# Patient Record
Sex: Female | Born: 1940
Health system: Southern US, Community
[De-identification: ages and names within clinical notes are randomized; demographics above are authoritative.]

## PROBLEM LIST (undated history)

## (undated) DIAGNOSIS — Z923 Personal history of irradiation: Secondary | ICD-10-CM

## (undated) DIAGNOSIS — G20 Parkinson's disease: Secondary | ICD-10-CM

## (undated) DIAGNOSIS — I10 Essential (primary) hypertension: Secondary | ICD-10-CM

## (undated) DIAGNOSIS — G20A1 Parkinson's disease without dyskinesia, without mention of fluctuations: Secondary | ICD-10-CM

## (undated) DIAGNOSIS — R3915 Urgency of urination: Secondary | ICD-10-CM

## (undated) DIAGNOSIS — C189 Malignant neoplasm of colon, unspecified: Secondary | ICD-10-CM

## (undated) DIAGNOSIS — C801 Malignant (primary) neoplasm, unspecified: Secondary | ICD-10-CM

## (undated) HISTORY — PX: BREAST SURGERY: SHX581

## (undated) HISTORY — PX: ABDOMINAL HYSTERECTOMY: SHX81

## (undated) HISTORY — PX: BREAST EXCISIONAL BIOPSY: SUR124

## (undated) HISTORY — PX: OTHER SURGICAL HISTORY: SHX169

## (undated) SURGERY — Surgical Case
Anesthesia: *Unknown

---

## 1998-12-19 ENCOUNTER — Encounter: Payer: Self-pay | Admitting: Family Medicine

## 1998-12-19 ENCOUNTER — Encounter: Admission: RE | Admit: 1998-12-19 | Discharge: 1998-12-19 | Payer: Self-pay | Admitting: Family Medicine

## 1998-12-30 ENCOUNTER — Encounter (INDEPENDENT_AMBULATORY_CARE_PROVIDER_SITE_OTHER): Payer: Self-pay | Admitting: Specialist

## 1998-12-30 ENCOUNTER — Ambulatory Visit (HOSPITAL_BASED_OUTPATIENT_CLINIC_OR_DEPARTMENT_OTHER): Admission: RE | Admit: 1998-12-30 | Discharge: 1998-12-30 | Payer: Self-pay | Admitting: Surgery

## 1999-08-27 ENCOUNTER — Emergency Department (HOSPITAL_COMMUNITY): Admission: EM | Admit: 1999-08-27 | Discharge: 1999-08-27 | Payer: Self-pay | Admitting: *Deleted

## 1999-08-31 ENCOUNTER — Other Ambulatory Visit: Admission: RE | Admit: 1999-08-31 | Discharge: 1999-08-31 | Payer: Self-pay | Admitting: Family Medicine

## 1999-12-21 ENCOUNTER — Encounter: Admission: RE | Admit: 1999-12-21 | Discharge: 1999-12-21 | Payer: Self-pay | Admitting: Family Medicine

## 1999-12-21 ENCOUNTER — Encounter: Payer: Self-pay | Admitting: Family Medicine

## 2000-05-13 ENCOUNTER — Encounter: Payer: Self-pay | Admitting: Family Medicine

## 2000-05-13 ENCOUNTER — Encounter: Admission: RE | Admit: 2000-05-13 | Discharge: 2000-05-13 | Payer: Self-pay | Admitting: Family Medicine

## 2001-10-18 ENCOUNTER — Encounter: Payer: Self-pay | Admitting: Family Medicine

## 2001-10-18 ENCOUNTER — Encounter: Admission: RE | Admit: 2001-10-18 | Discharge: 2001-10-18 | Payer: Self-pay | Admitting: Family Medicine

## 2001-10-24 ENCOUNTER — Encounter: Payer: Self-pay | Admitting: Family Medicine

## 2001-10-24 ENCOUNTER — Encounter: Admission: RE | Admit: 2001-10-24 | Discharge: 2001-10-24 | Payer: Self-pay | Admitting: Family Medicine

## 2002-12-10 ENCOUNTER — Encounter: Admission: RE | Admit: 2002-12-10 | Discharge: 2002-12-10 | Payer: Self-pay | Admitting: Family Medicine

## 2002-12-26 ENCOUNTER — Ambulatory Visit (HOSPITAL_COMMUNITY): Admission: RE | Admit: 2002-12-26 | Discharge: 2002-12-26 | Payer: Self-pay | Admitting: General Surgery

## 2003-02-27 ENCOUNTER — Encounter (INDEPENDENT_AMBULATORY_CARE_PROVIDER_SITE_OTHER): Payer: Self-pay | Admitting: Specialist

## 2003-02-27 ENCOUNTER — Inpatient Hospital Stay (HOSPITAL_COMMUNITY): Admission: RE | Admit: 2003-02-27 | Discharge: 2003-03-04 | Payer: Self-pay | Admitting: General Surgery

## 2003-12-19 ENCOUNTER — Encounter: Admission: RE | Admit: 2003-12-19 | Discharge: 2003-12-19 | Payer: Self-pay | Admitting: Family Medicine

## 2004-05-26 ENCOUNTER — Encounter: Admission: RE | Admit: 2004-05-26 | Discharge: 2004-05-26 | Payer: Self-pay | Admitting: Family Medicine

## 2005-01-11 ENCOUNTER — Encounter: Admission: RE | Admit: 2005-01-11 | Discharge: 2005-01-11 | Payer: Self-pay | Admitting: Family Medicine

## 2005-11-26 ENCOUNTER — Other Ambulatory Visit: Admission: RE | Admit: 2005-11-26 | Discharge: 2005-11-26 | Payer: Self-pay | Admitting: Family Medicine

## 2006-01-01 HISTORY — PX: BREAST BIOPSY: SHX20

## 2006-01-12 ENCOUNTER — Encounter: Admission: RE | Admit: 2006-01-12 | Discharge: 2006-01-12 | Payer: Self-pay | Admitting: Family Medicine

## 2006-01-20 ENCOUNTER — Encounter: Admission: RE | Admit: 2006-01-20 | Discharge: 2006-01-20 | Payer: Self-pay | Admitting: Family Medicine

## 2006-01-24 ENCOUNTER — Encounter (INDEPENDENT_AMBULATORY_CARE_PROVIDER_SITE_OTHER): Payer: Self-pay | Admitting: Specialist

## 2006-01-24 ENCOUNTER — Encounter: Admission: RE | Admit: 2006-01-24 | Discharge: 2006-01-24 | Payer: Self-pay | Admitting: Family Medicine

## 2006-01-24 ENCOUNTER — Encounter (INDEPENDENT_AMBULATORY_CARE_PROVIDER_SITE_OTHER): Payer: Self-pay | Admitting: Diagnostic Radiology

## 2006-01-31 ENCOUNTER — Encounter: Admission: RE | Admit: 2006-01-31 | Discharge: 2006-01-31 | Payer: Self-pay | Admitting: Family Medicine

## 2006-02-01 DIAGNOSIS — Z923 Personal history of irradiation: Secondary | ICD-10-CM

## 2006-02-01 HISTORY — DX: Personal history of irradiation: Z92.3

## 2006-02-01 HISTORY — PX: BREAST LUMPECTOMY: SHX2

## 2006-02-17 ENCOUNTER — Encounter: Admission: RE | Admit: 2006-02-17 | Discharge: 2006-02-17 | Payer: Self-pay | Admitting: General Surgery

## 2006-02-22 ENCOUNTER — Ambulatory Visit (HOSPITAL_BASED_OUTPATIENT_CLINIC_OR_DEPARTMENT_OTHER): Admission: RE | Admit: 2006-02-22 | Discharge: 2006-02-22 | Payer: Self-pay | Admitting: General Surgery

## 2006-02-22 ENCOUNTER — Encounter: Admission: RE | Admit: 2006-02-22 | Discharge: 2006-02-22 | Payer: Self-pay | Admitting: General Surgery

## 2006-02-22 ENCOUNTER — Encounter (INDEPENDENT_AMBULATORY_CARE_PROVIDER_SITE_OTHER): Payer: Self-pay | Admitting: Specialist

## 2006-03-01 ENCOUNTER — Ambulatory Visit: Payer: Self-pay | Admitting: Oncology

## 2006-03-14 ENCOUNTER — Ambulatory Visit: Admission: RE | Admit: 2006-03-14 | Discharge: 2006-05-25 | Payer: Self-pay | Admitting: Radiation Oncology

## 2006-03-24 ENCOUNTER — Encounter: Admission: RE | Admit: 2006-03-24 | Discharge: 2006-03-24 | Payer: Self-pay | Admitting: Radiation Oncology

## 2006-04-04 LAB — CBC WITH DIFFERENTIAL/PLATELET
BASO%: 0.9 % (ref 0.0–2.0)
Basophils Absolute: 0 10*3/uL (ref 0.0–0.1)
Eosinophils Absolute: 0.1 10*3/uL (ref 0.0–0.5)
HCT: 37.1 % (ref 34.8–46.6)
LYMPH%: 25 % (ref 14.0–48.0)
MONO#: 0.3 10*3/uL (ref 0.1–0.9)
MONO%: 6 % (ref 0.0–13.0)
NEUT#: 3.2 10*3/uL (ref 1.5–6.5)
Platelets: 232 10*3/uL (ref 145–400)
lymph#: 1.2 10*3/uL (ref 0.9–3.3)

## 2006-04-04 LAB — COMPREHENSIVE METABOLIC PANEL
AST: 17 U/L (ref 0–37)
Albumin: 4.5 g/dL (ref 3.5–5.2)
BUN: 20 mg/dL (ref 6–23)
CO2: 30 mEq/L (ref 19–32)
Chloride: 103 mEq/L (ref 96–112)
Creatinine, Ser: 0.82 mg/dL (ref 0.40–1.20)
Sodium: 143 mEq/L (ref 135–145)
Total Bilirubin: 0.5 mg/dL (ref 0.3–1.2)
Total Protein: 6.7 g/dL (ref 6.0–8.3)

## 2006-04-04 LAB — CANCER ANTIGEN 27.29: CA 27.29: 15 U/mL (ref 0–39)

## 2006-05-04 ENCOUNTER — Ambulatory Visit: Payer: Self-pay | Admitting: Oncology

## 2006-05-09 LAB — COMPREHENSIVE METABOLIC PANEL
AST: 20 U/L (ref 0–37)
CO2: 29 mEq/L (ref 19–32)
Chloride: 106 mEq/L (ref 96–112)
Creatinine, Ser: 0.81 mg/dL (ref 0.40–1.20)
Total Bilirubin: 0.4 mg/dL (ref 0.3–1.2)
Total Protein: 6.8 g/dL (ref 6.0–8.3)

## 2006-05-09 LAB — CBC WITH DIFFERENTIAL/PLATELET
HCT: 37.7 % (ref 34.8–46.6)
Platelets: 208 10*3/uL (ref 145–400)
RDW: 15.5 % — ABNORMAL HIGH (ref 11.3–14.5)

## 2006-09-08 ENCOUNTER — Ambulatory Visit: Payer: Self-pay | Admitting: Oncology

## 2006-09-12 LAB — COMPREHENSIVE METABOLIC PANEL
ALT: 15 U/L (ref 0–35)
Albumin: 4.1 g/dL (ref 3.5–5.2)
Alkaline Phosphatase: 57 U/L (ref 39–117)
BUN: 22 mg/dL (ref 6–23)
CO2: 26 mEq/L (ref 19–32)
Chloride: 106 mEq/L (ref 96–112)
Potassium: 3.7 mEq/L (ref 3.5–5.3)
Total Protein: 6.3 g/dL (ref 6.0–8.3)

## 2006-09-12 LAB — CBC WITH DIFFERENTIAL/PLATELET
BASO%: 0.6 % (ref 0.0–2.0)
Basophils Absolute: 0 10*3/uL (ref 0.0–0.1)
Eosinophils Absolute: 0.1 10*3/uL (ref 0.0–0.5)
HGB: 12.6 g/dL (ref 11.6–15.9)
LYMPH%: 19.7 % (ref 14.0–48.0)
MCHC: 34.8 g/dL (ref 32.0–36.0)
MONO#: 0.3 10*3/uL (ref 0.1–0.9)
NEUT#: 3 10*3/uL (ref 1.5–6.5)
RDW: 14.7 % — ABNORMAL HIGH (ref 11.3–14.5)

## 2006-12-12 ENCOUNTER — Ambulatory Visit: Payer: Self-pay | Admitting: Oncology

## 2006-12-14 LAB — CBC WITH DIFFERENTIAL/PLATELET
BASO%: 1.2 % (ref 0.0–2.0)
Basophils Absolute: 0.1 10*3/uL (ref 0.0–0.1)
HCT: 36.9 % (ref 34.8–46.6)
MCV: 86.6 fL (ref 81.0–101.0)
MONO%: 6.7 % (ref 0.0–13.0)
RBC: 4.27 10*6/uL (ref 3.70–5.32)
RDW: 14.5 % (ref 11.3–14.5)
lymph#: 1 10*3/uL (ref 0.9–3.3)

## 2006-12-14 LAB — COMPREHENSIVE METABOLIC PANEL
ALT: 23 U/L (ref 0–35)
AST: 30 U/L (ref 0–37)
Albumin: 4.3 g/dL (ref 3.5–5.2)
Alkaline Phosphatase: 70 U/L (ref 39–117)
BUN: 15 mg/dL (ref 6–23)
Chloride: 105 mEq/L (ref 96–112)
Creatinine, Ser: 0.91 mg/dL (ref 0.40–1.20)
Total Bilirubin: 0.5 mg/dL (ref 0.3–1.2)
Total Protein: 6.7 g/dL (ref 6.0–8.3)

## 2007-01-16 ENCOUNTER — Encounter: Admission: RE | Admit: 2007-01-16 | Discharge: 2007-01-16 | Payer: Self-pay | Admitting: Oncology

## 2007-06-12 ENCOUNTER — Ambulatory Visit: Payer: Self-pay | Admitting: Oncology

## 2007-06-19 IMAGING — CR DG CHEST 2V
2 series · 2 of 2 positions shown · non-contrast
Comparison: Report dated 02/15/2003.

CLINICAL DATA: Left breast carcinoma. Preoperative evaluation. Hypertension.

CHEST - 2 VIEW

[w chest pa]
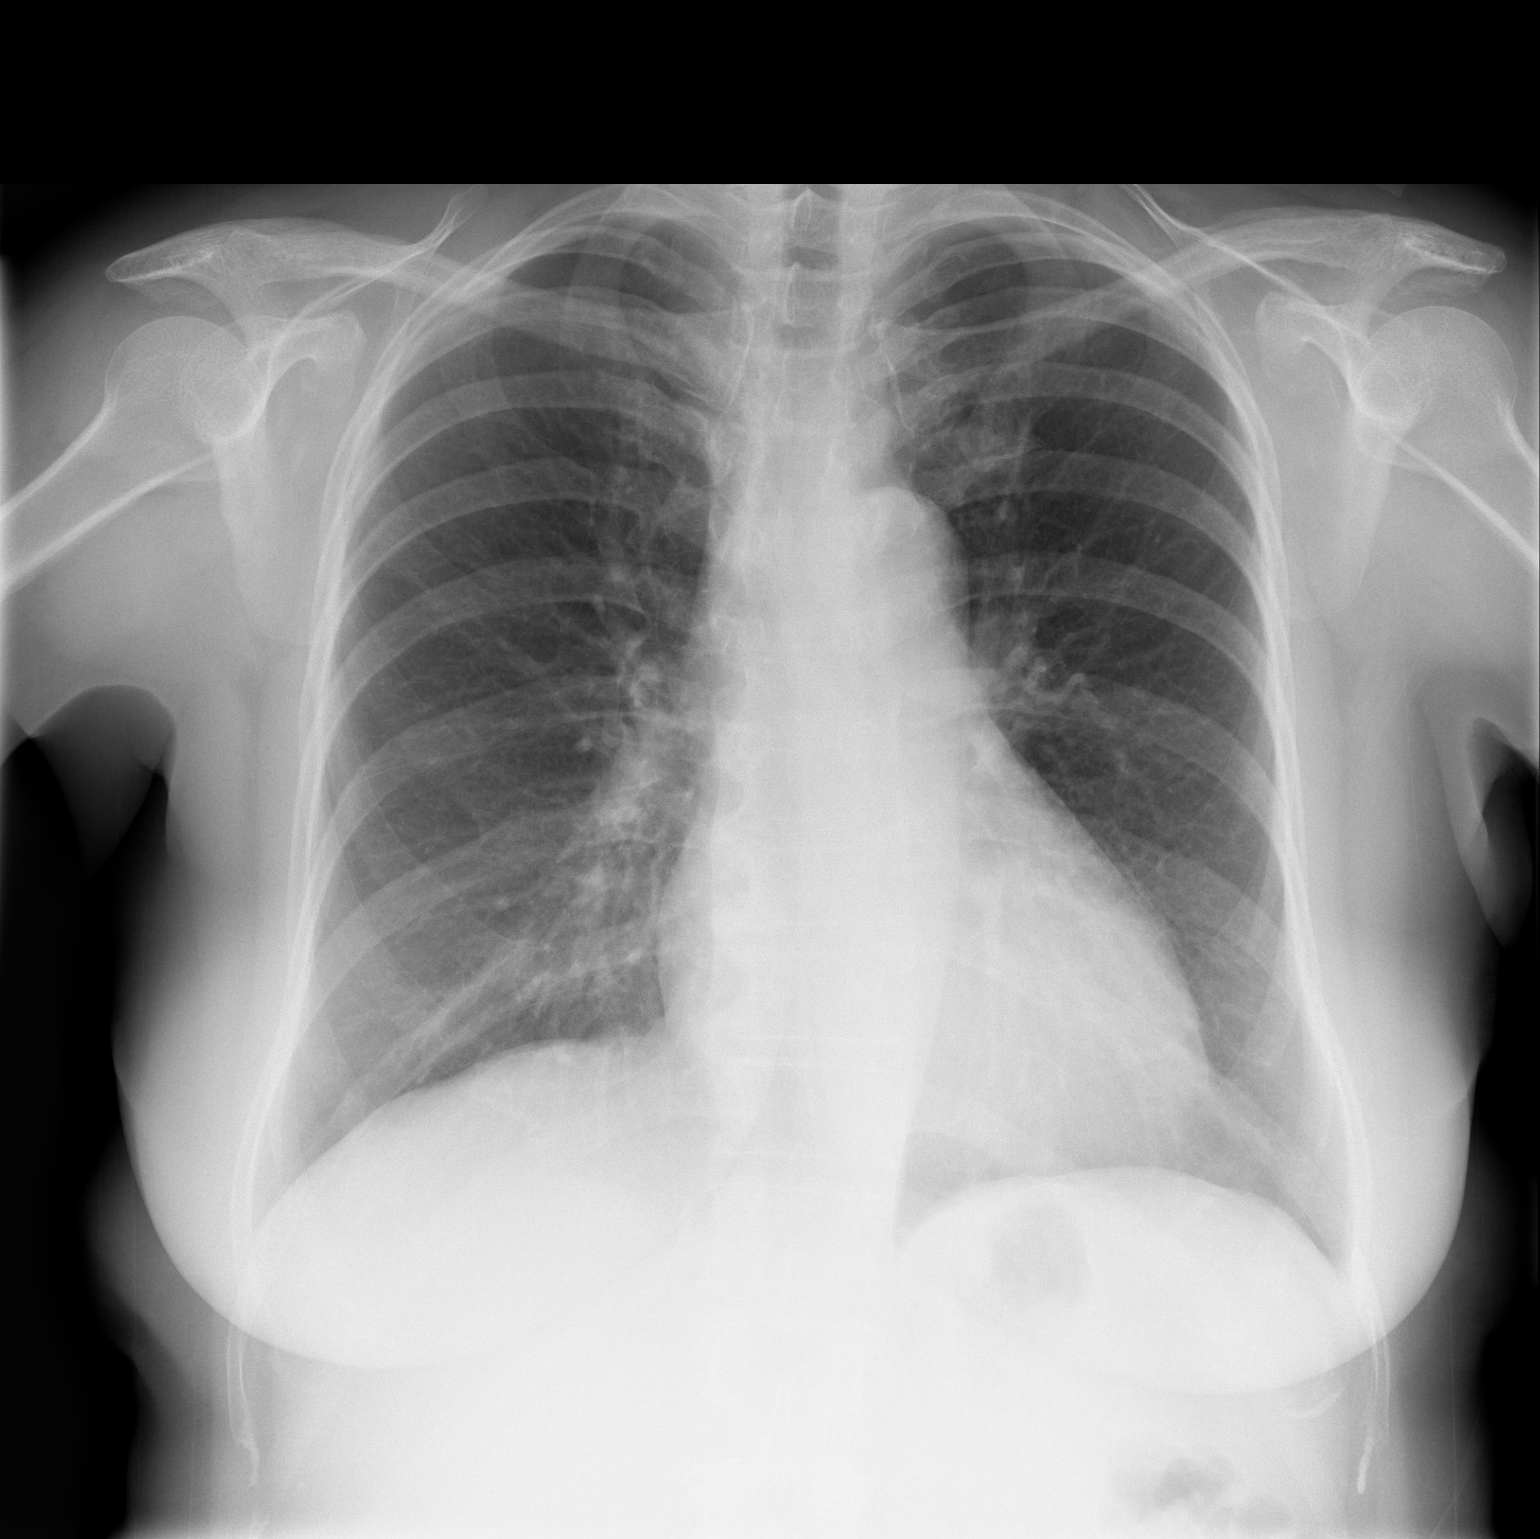

[w chest lat]
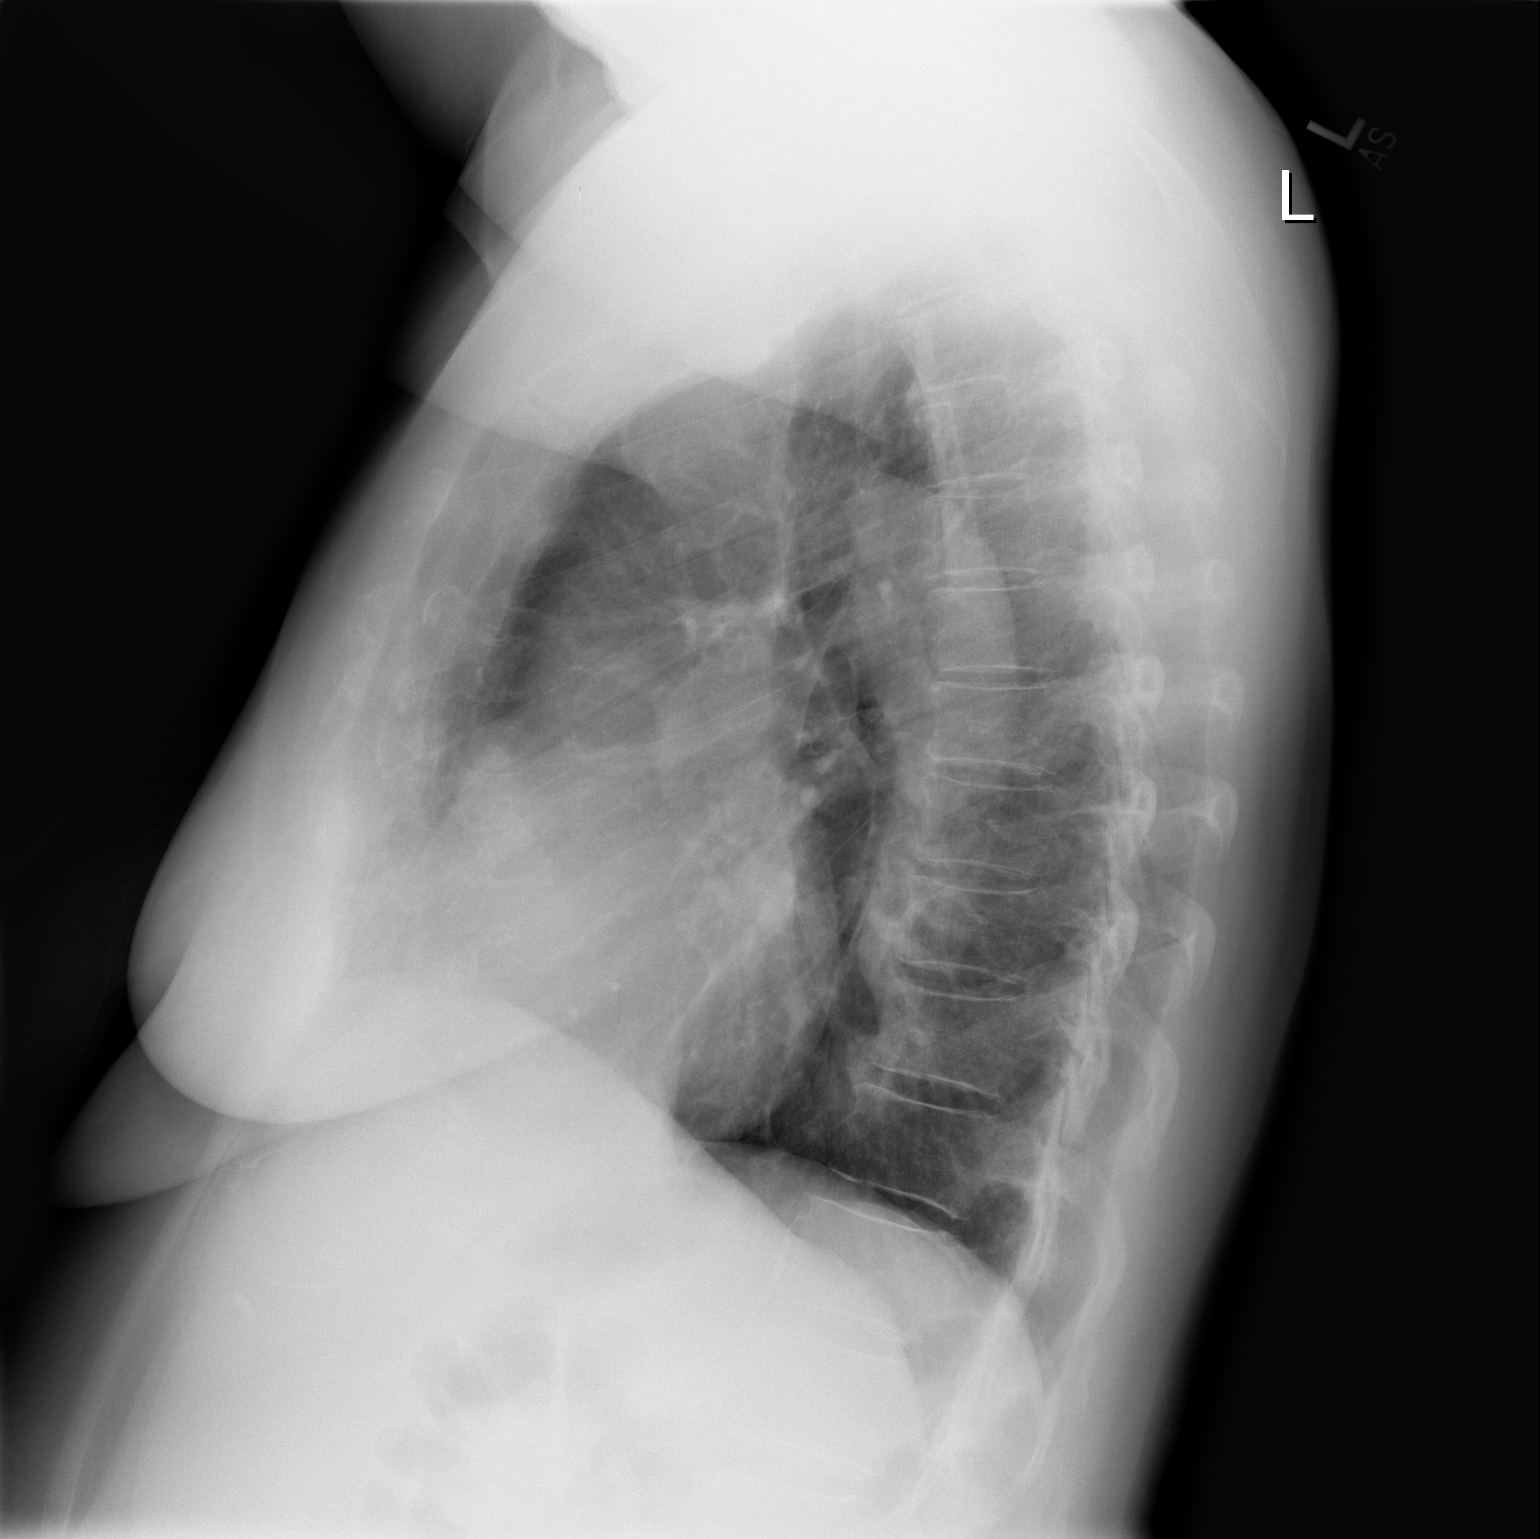

[2 of 2 positions shown; findings below may reference images not displayed]

FINDINGS: Borderline enlarged cardiac silhouette. Mildly tortuous aorta. Clear
lungs. Lower thoracic spine degenerative changes and minimal scoliosis.

IMPRESSION

Borderline cardiomegaly. No acute abnormality.

## 2007-11-22 ENCOUNTER — Other Ambulatory Visit: Admission: RE | Admit: 2007-11-22 | Discharge: 2007-11-22 | Payer: Self-pay | Admitting: Obstetrics and Gynecology

## 2007-12-06 ENCOUNTER — Encounter: Admission: RE | Admit: 2007-12-06 | Discharge: 2007-12-06 | Payer: Self-pay | Admitting: Obstetrics and Gynecology

## 2007-12-14 ENCOUNTER — Ambulatory Visit (HOSPITAL_COMMUNITY): Admission: RE | Admit: 2007-12-14 | Discharge: 2007-12-14 | Payer: Self-pay | Admitting: Obstetrics and Gynecology

## 2008-01-17 ENCOUNTER — Encounter: Admission: RE | Admit: 2008-01-17 | Discharge: 2008-01-17 | Payer: Self-pay | Admitting: Oncology

## 2008-07-10 ENCOUNTER — Ambulatory Visit: Payer: Self-pay | Admitting: Oncology

## 2009-01-17 ENCOUNTER — Encounter: Admission: RE | Admit: 2009-01-17 | Discharge: 2009-01-17 | Payer: Self-pay | Admitting: Oncology

## 2009-05-18 ENCOUNTER — Emergency Department (HOSPITAL_COMMUNITY): Admission: EM | Admit: 2009-05-18 | Discharge: 2009-05-18 | Payer: Self-pay | Admitting: Emergency Medicine

## 2009-07-09 ENCOUNTER — Ambulatory Visit: Payer: Self-pay | Admitting: Oncology

## 2009-08-20 ENCOUNTER — Ambulatory Visit: Payer: Self-pay | Admitting: Oncology

## 2010-01-08 ENCOUNTER — Encounter
Admission: RE | Admit: 2010-01-08 | Discharge: 2010-01-08 | Payer: Self-pay | Source: Home / Self Care | Admitting: Family Medicine

## 2010-01-19 ENCOUNTER — Encounter
Admission: RE | Admit: 2010-01-19 | Discharge: 2010-01-19 | Payer: Self-pay | Source: Home / Self Care | Attending: Oncology | Admitting: Oncology

## 2010-02-21 ENCOUNTER — Encounter: Payer: Self-pay | Admitting: General Surgery

## 2010-02-22 ENCOUNTER — Encounter: Payer: Self-pay | Admitting: Family Medicine

## 2010-04-21 LAB — BASIC METABOLIC PANEL
Calcium: 8.9 mg/dL (ref 8.4–10.5)
Creatinine, Ser: 0.75 mg/dL (ref 0.4–1.2)
GFR calc Af Amer: >60 mL/min (ref >60)
Glucose, Bld: 94 mg/dL (ref 70–99)
Potassium: 3 mEq/L — ABNORMAL LOW (ref 3.5–5.1)

## 2010-04-21 LAB — CBC
MCHC: 33 g/dL (ref 30.0–36.0)
Platelets: 197 10*3/uL (ref 150–400)

## 2010-04-21 LAB — URINE CULTURE
Colony Count: NO GROWTH
Culture: NO GROWTH

## 2010-04-21 LAB — DIFFERENTIAL
Basophils Absolute: 0.1 10*3/uL (ref 0.0–0.1)
Monocytes Relative: 7 % (ref 3–12)
Neutrophils Relative %: 68 % (ref 43–77)

## 2010-04-21 LAB — URINALYSIS, ROUTINE W REFLEX MICROSCOPIC
Nitrite: NEGATIVE
Protein, ur: 30 mg/dL — AB

## 2010-04-21 LAB — URINE MICROSCOPIC-ADD ON

## 2010-06-19 NOTE — Op Note (Signed)
NAMEMARINE, LEZOTTE                ACCOUNT NO.:  [id number]   MEDICAL RECORD NO.:  [id number]          PATIENT TYPE:  AMB   LOCATION:  DSC                          FACILITY:  [facility]   PHYSICIAN:  Angelia Mould. Derrell Lolling, M.D.DATE OF BIRTH:  25-Nov-1940   DATE OF PROCEDURE:  02/22/2006  DATE OF DISCHARGE:                               OPERATIVE REPORT   PREOPERATIVE DIAGNOSIS:  Ductal carcinoma in situ left breast.   POSTOPERATIVE DIAGNOSIS:  Ductal carcinoma in situ left breast.   OPERATION PERFORMED:  Left partial mastectomy with needle localization  and specimen mammogram.   SURGEON:  Angelia Mould. Derrell Lolling, M.D.   OPERATIVE INDICATIONS:  This is a 70 year old white female who has a  history of villous adenoma of her right colon and underwent a right  colectomy in 2005.  On a recent mammogram, there was a focal area of  microcalcifications in the 12 o'clock position of the left breast.  Stereotactic core biopsy showed ductal carcinoma in situ which was ER  and PR positive.  She had an MRI last month which showed a solitary  enhancement at the 12 o'clock position of the left breast consistent  with a biopsy site, and otherwise, there was no abnormality in either  breast and there was no adenopathy.  Options were discussed and she was  counseled as an outpatient.  She was interested in breast conservation.  She is brought to the operating room for partial mastectomy.   OPERATIVE TECHNIQUE:  The patient underwent wire localization of the  area of microcalcifications in her left breast this morning.  That wire  localization was satisfactory.  The patient was brought to the operating  room, placed supine on the operative table.  General endotracheal  anesthesia was induced.  The left breast was prepped and draped in a  sterile fashion.  The wire localization films were reviewed.  The wire  entered superiorly, but was angled posteriorly and inferiorly.  A curved  transverse incision was made in  the superior aspect of the left breast  about 2 cm inferior to the insertion site of the wire.  Dissection was  carried down into the breast tissue and around the wire in all  directions.  Dissection was mostly with electrocautery.  The specimen  was marked with silk sutures to orient the pathologist.  Specimen  mammogram showed that the microcalcifications were within the specimen,  and imprint cytology of the margins by Dr. Laureen Ochs revealed no evidence of  any cancer cells.  The wound was irrigated with saline.  Hemostasis was  excellent.  The breast tissue was closed with interrupted sutures of 3-0  Vicryl and the skin closed with a running subcuticular suture of 4-0  Monocryl and Steri-Strips.  Clean bandages were placed.  The patient was  taken to the recovery room in stable condition.   ESTIMATED BLOOD LOSS:  About 10 mL.   COMPLICATIONS:  None.   SPONGE AND INSTRUMENT COUNTS:  Correct.      Angelia Mould. Derrell Lolling, M.D.  Electronically Signed     HMI/MEDQ  D:  02/22/2006  T:  02/22/2006  Job:  [id number]   cc:   Chales Salmon. Abigail Miyamoto, M.D.  James L. Malon Kindle., M.D.

## 2010-06-19 NOTE — Discharge Summary (Signed)
NAME:  Katie Carrillo, Katie Carrillo                          ACCOUNT NO.:  [id number]   MEDICAL RECORD NO.:  [id number]                   PATIENT TYPE:  INP   LOCATION:  0359                                 FACILITY:  [facility]   PHYSICIAN:  Angelia Mould. Derrell Lolling, M.D.             DATE OF BIRTH:  01/10/1941   DATE OF ADMISSION:  02/27/2003  DATE OF DISCHARGE:  03/04/2003                                 DISCHARGE SUMMARY   FINAL DIAGNOSES:  1. Villous adenoma of the right colon.  2. Borderline hypertension.   OPERATIONS PERFORMED:  Laparoscopy-assisted right colectomy.  Date:  February 27, 2003.   HISTORY:  This is a 70 year old white female whose mother had a history of  colon polyps.  She underwent screening colonoscopy which revealed an  ulcerated mass in the mid ascending colon.  Biopsy showed villous adenoma.  Dr. Randa Evens was concerned that this might be underlying cancer.  She was  counseled in the office regarding segmental colon resection.  She underwent  bowel prep at home and was brought to the hospital electively for colon  resection.   PHYSICAL EXAMINATION:  GENERAL:  Pleasant woman, somewhat overweight.  Weight 212 pounds.  NECK:  Revealed no adenopathy or mass.  LUNGS:  Clear to auscultation.  HEART:  Regular rate and rhythm, no murmur.  ABDOMEN:  Soft, nontender, somewhat obese.  Liver and spleen not enlarged.  Well-healed Pfannenstiel incision.  No hernias, no palpable mass.  GENITOURINARY:  No inguinal adenopathy.   HOSPITAL COURSE:  On the day of admission the patient was taken to the  operating room and underwent a laparoscopy-assisted right colectomy.  We  identified a 3 cm sessile polyp in the mid ascending colon and no other  abnormalities were found.   Final pathology report showed tubulovillous adenoma with no evidence of high-  grade dysplasia or carcinoma.  Eleven benign lymph nodes were evaluated.   Postoperatively, the patient did well.  We advanced her diet slowly  but  steadily without any problems.  She began having bowel movements as her  ileus resolved.  She was discharged on March 04, 2003.  On the date of  discharge her wound was healthy without any signs of infection.  She was  having bowel movements, tolerating  regular diet, she was comfortable and  afebrile.  She was asked to follow up with me in the office in 4-7 days.                                               Angelia Mould. Derrell Lolling, M.D.    HMI/MEDQ  D:  03/11/2003  T:  03/11/2003  Job:  [id number]   cc:   Chales Salmon. Abigail Miyamoto, M.D.  [address]  [place]  [place] [address]  Fax:  [phone]   Fayrene Fearing L. Malon Kindle., M.D.  [address]  [place]  [place] [address]  Fax: [phone]

## 2010-06-19 NOTE — Op Note (Signed)
[facility]  Patient:    Katie Carrillo                        MRN: [id number] Proc. Date: 12/30/98 Adm. Date:  [id number] Attending:  Katha Cabal CC:         Chales Salmon. Abigail Miyamoto, M.D.                           Operative Report  CCS# [id number]  PREOPERATIVE DIAGNOSIS:  Basal cell carcinoma of the right leg.  POSTOPERATIVE DIAGNOSIS:  Basal cell carcinoma of the right leg, status post excision and primary closure of basal cell carcinoma.  OPERATION PERFORMED:  SURGEON:  Matthew B. Daphine Deutscher, M.D.  ANESTHESIA:  MAC.  INDICATIONS FOR PROCEDURE:  [name] is a 70 year old lady who was seen in y office on November 17 with a history of a right leg basal cell carcinoma biopsied at Dr. Nils Flack office on December 08, 1998.  A pathology report was reviewed indicating this.  I obtained informed consent regarding excision with either primary closure or split thickness skin graft.  The area is on the right shin anteriorly.  The patient states that she had a basal cell carcinoma taken off that area about five years ago.  DESCRIPTION OF PROCEDURE:  [name] was taken to operating room 3 on the morning of November 28 and given intravenous sedation. The leg was prepped with Betadine and draped sterilely.  I described a longitudinally oriented ellipse to get margins around this area of slightly reddened, erythematous area.  I had gross margins which were negative.  I went ahead and injected this area with lidocaine and made a full thickness excision down to the fascia including the fat.  This was marked ith a suture on the distal margin and then I was able to undermine the flaps medially and laterally with sharp dissection and then I closed this primarily with interrupted 4-0 Vicryls then a running 4-0 nylon suture.  A sterile dressing was then applied.  The patient tolerated the procedure well.  She will be discharged on Tylox to take for  pain and will be followed up for suture removal in 10 days to two weeks.  FINAL DIAGNOSIS:  Basal cell carcinoma of the leg status post excision with primary closure. DD:  12/30/98 TD:  12/30/98 Job: [id number]

## 2010-06-19 NOTE — Op Note (Signed)
NAME:  Katie Carrillo, Katie Carrillo                          ACCOUNT NO.:  [id number]   MEDICAL RECORD NO.:  [id number]                   PATIENT TYPE:  INP   LOCATION:  X006                                 FACILITY:  [facility]   PHYSICIAN:  Angelia Mould. Derrell Lolling, M.D.             DATE OF BIRTH:  [birth year]   DATE OF PROCEDURE:  02/27/2003  DATE OF DISCHARGE:                                 OPERATIVE REPORT   PREOPERATIVE DIAGNOSIS:  Neoplastic mass of the right colon.   POSTOPERATIVE DIAGNOSIS:  Neoplastic mass of the right colon.   OPERATION PERFORMED:  Laparoscopic-assisted right colectomy.   SURGEON:  Angelia Mould. Derrell Lolling, M.D.   FIRST ASSISTANT:  Currie Paris, M.D.   OPERATIVE INDICATIONS:  This is a 70 year old white female, who had a  screening colonoscopy.  Dr. Carman Ching performed the colonoscopy and  stated that he saw approximately 5 cm diameter ulcerated mass in the mid  ascending colon.  He said this was in the mid ascending colon.  This was  biopsied and showed villous adenoma.  Dr. Randa Evens was concerned that this  might be a carcinoma.  She has had a CT scan which showed some thickening in  the cecum but nothing else.  She has undergone a bowel prep at home and is  brought to the operating room electively for a right colectomy.   OPERATIVE FINDINGS:  I found approximately 2.5-3.0 cm multilobulated polyp  in the mid ascending colon, clearly above the ileocecal valve.  This looked  more like a villous adenoma than a carcinoma.  I did not palpate or see any  other mass in the colon.  There was no other abnormality noted on general  laparoscopic survey of the abdomen or pelvis.  Given the fact that Dr.  Randa Evens only saw 1 polypoid mass in the colon, and I clearly identified this  mass, I felt that this was consistent with the colonoscopic findings.  I  chose to take the resection up beyond the hepatic flexure, nevertheless.   OPERATIVE TECHNIQUE:  Following the induction of general  endotracheal  anesthesia, a Foley catheter was inserted.  The abdomen was prepped and  draped in a sterile fashion.  A transverse incision was made at the superior  rim of the umbilicus.  The fascia was incised transversely.  The abdominal  cavity was entered under direct vision.  A 10 mm Hasson trocar was inserted  and secured with a pursestring suture of 0 Vicryl.  Pneumoperitoneum was  created.  Video cam was inserted with visualization and findings as  described above.  A 10 mm trocar was placed midway between the umbilicus and  the symphysis pubis and a 5 mm trocar placed in the right mid abdomen.  The  patient was examined in multiple positions, mostly in a left tilt, head up  or left tilt, head down position.  We mobilized the terminal ileum by  dividing a few adhesions and then dividing the lateral peritoneal  attachments.  We then mobilized the right colon by dividing the lateral  peritoneal attachments and then bluntly dissecting the right colon and the  terminal ileum up across the midline.  We divided the attachments at the  hepatic flexure and using the harmonic scalpel, took the dissection over  almost to the mid transverse colon.  We identified the duodenum and then  separated the duodenum from the right colon mesentery without any trouble.  Once we had the colon completely mobilized, we could pull the cecum all the  way over almost to the left upper quadrant.  At this point in time, we felt  we had enough mobilization.  The pneumoperitoneum was released.  We removed  the umbilical trocar, and an 8 cm transverse incision was made on the right  abdomen, starting at the umbilicus and extending to the right.  Muscles were  divided with electrocautery.  Hemostasis was good.  We placed a wound  protractor in this wound and then brought the cecum up through the wound.  We found that we could mobilize the terminal ileum, the cecum, the right  colon, and the hepatic flexure without  any difficulty at all.  We palpated  the 1 inch polypoid mass in the right colon above cecum and above the  ileocecal valve.  We very carefully palpated the colon proximally and found  no other masses.  We transected the terminal ileum with a GIA stapling  device and placed stay sutures on the remaining ileum.  We transected the  right transverse colon with the GIA stapling device.  We had good  mobilization of the mesentery and took the mesenteric dissection fairly far,  isolating the mesenteric vessels, clamping them, dividing them, and ligating  them with 2-0 silk ties.  The larger right colic and ileocolic vessels were  doubly ligated with 2-0 silk ties.   We removed the specimen and took it to a side table.  We opened the specimen  and identified the polyp as described above.  I reviewed the colonoscopy  report, confirming that only 1 polypoid mass had been found.  I felt that  this was consistent with the colonoscopic findings, and the specimen was  sent to pathology.   I changed my gloves.  An anastomosis was created between the terminal ileum  and the right transverse colon in a functional end-to-end fashion with a GIA  stapling device.  The defect in the bowel wall was closed with TA 60  stapling device.   We then changed our gloves and instruments and draped off the field again.  We closed the mesentery with interrupted sutures of 2-0 silk.  We checked  for bleeding and found none.  We irrigated the colon and then dropped it  back in the abdominal cavity.  The posterior rectus sheath was closed with a  running suture of #1 PDS; the wound was irrigated; the anterior rectus  sheath was closed with a running suture of #1 PDS.  The midline fascia was  closed with #1 PDS.  The wound was irrigated with saline and the skin closed  with skin staples.   We insufflated the abdomen one more time and placed the 30-degree scope back in and surveyed all 4 quadrants of the abdomen.  The  bowel looked healthy.  There was really no blood and no signs of any bleeding whatsoever in the  abdomen or at the trocar sites.  The pneumoperitoneum was released.  The  trocars were removed.  Skin incisions were closed with skin staples.  Clean  bandages were placed and the patient taken to recovery in stable condition.  The estimated blood loss was about 50-60 mL.  Complications were none.  Sponge, needle, and instrument counts were correct.                                               Angelia Mould. Derrell Lolling, M.D.    HMI/MEDQ  D:  02/27/2003  T:  02/27/2003  Job:  [id number]   cc:   Chales Salmon. Abigail Miyamoto, M.D.  [address]  [place]  [place] [address]  Fax: [phone]   Llana Aliment. Malon Kindle., M.D.  [address]  [place]  [place] [address]  Fax: [phone]

## 2010-09-30 ENCOUNTER — Other Ambulatory Visit: Payer: Self-pay | Admitting: Oncology

## 2010-09-30 ENCOUNTER — Encounter (HOSPITAL_BASED_OUTPATIENT_CLINIC_OR_DEPARTMENT_OTHER): Payer: Medicare Other | Admitting: Oncology

## 2010-09-30 DIAGNOSIS — C50419 Malignant neoplasm of upper-outer quadrant of unspecified female breast: Secondary | ICD-10-CM

## 2010-09-30 DIAGNOSIS — Z170 Estrogen receptor positive status [ER+]: Secondary | ICD-10-CM

## 2010-09-30 DIAGNOSIS — D0590 Unspecified type of carcinoma in situ of unspecified breast: Secondary | ICD-10-CM

## 2010-09-30 DIAGNOSIS — Z853 Personal history of malignant neoplasm of breast: Secondary | ICD-10-CM

## 2010-09-30 LAB — CBC WITH DIFFERENTIAL/PLATELET
BASO%: 0.7 % (ref 0.0–2.0)
EOS%: 2.8 % (ref 0.0–7.0)
HCT: 37.6 % (ref 34.8–46.6)
LYMPH%: 21.9 % (ref 14.0–49.7)
MCH: 30.1 pg (ref 25.1–34.0)
MCHC: 34.1 g/dL (ref 31.5–36.0)
MONO#: 0.3 10*3/uL (ref 0.1–0.9)
NEUT%: 69 % (ref 38.4–76.8)
Platelets: 219 10*3/uL (ref 145–400)
RBC: 4.26 10*6/uL (ref 3.70–5.45)
WBC: 5 10*3/uL (ref 3.9–10.3)

## 2010-09-30 LAB — COMPREHENSIVE METABOLIC PANEL
ALT: 9 U/L (ref 0–35)
AST: 15 U/L (ref 0–37)
Alkaline Phosphatase: 61 U/L (ref 39–117)
Creatinine, Ser: 0.94 mg/dL (ref 0.50–1.10)
Sodium: 143 mEq/L (ref 135–145)
Total Bilirubin: 0.4 mg/dL (ref 0.3–1.2)
Total Protein: 6.7 g/dL (ref 6.0–8.3)

## 2011-01-21 ENCOUNTER — Ambulatory Visit
Admission: RE | Admit: 2011-01-21 | Discharge: 2011-01-21 | Disposition: A | Discharge status: Home / Self Care | Payer: Medicare Other | Source: Ambulatory Visit | Attending: Oncology | Admitting: Oncology

## 2011-01-21 DIAGNOSIS — Z853 Personal history of malignant neoplasm of breast: Secondary | ICD-10-CM

## 2011-05-25 DIAGNOSIS — M1710 Unilateral primary osteoarthritis, unspecified knee: Secondary | ICD-10-CM | POA: Diagnosis not present

## 2011-06-01 ENCOUNTER — Other Ambulatory Visit: Payer: Self-pay | Admitting: Orthopedic Surgery

## 2011-06-15 ENCOUNTER — Encounter (HOSPITAL_COMMUNITY): Payer: Self-pay | Admitting: Pharmacy Technician

## 2011-06-18 ENCOUNTER — Encounter (HOSPITAL_COMMUNITY)
Admission: RE | Admit: 2011-06-18 | Discharge: 2011-06-18 | Disposition: A | Discharge status: Home / Self Care | Payer: Medicare Other | Source: Ambulatory Visit | Attending: Orthopedic Surgery | Admitting: Orthopedic Surgery

## 2011-06-18 ENCOUNTER — Encounter (HOSPITAL_COMMUNITY): Payer: Self-pay

## 2011-06-18 DIAGNOSIS — Z853 Personal history of malignant neoplasm of breast: Secondary | ICD-10-CM | POA: Diagnosis not present

## 2011-06-18 DIAGNOSIS — M1710 Unilateral primary osteoarthritis, unspecified knee: Secondary | ICD-10-CM | POA: Diagnosis not present

## 2011-06-18 DIAGNOSIS — F0390 Unspecified dementia without behavioral disturbance: Secondary | ICD-10-CM | POA: Diagnosis not present

## 2011-06-18 DIAGNOSIS — Z01811 Encounter for preprocedural respiratory examination: Secondary | ICD-10-CM | POA: Diagnosis not present

## 2011-06-18 DIAGNOSIS — I10 Essential (primary) hypertension: Secondary | ICD-10-CM | POA: Diagnosis not present

## 2011-06-18 DIAGNOSIS — I517 Cardiomegaly: Secondary | ICD-10-CM | POA: Diagnosis not present

## 2011-06-18 HISTORY — DX: Malignant (primary) neoplasm, unspecified: C80.1

## 2011-06-18 HISTORY — DX: Urgency of urination: R39.15

## 2011-06-18 HISTORY — DX: Essential (primary) hypertension: I10

## 2011-06-18 LAB — BASIC METABOLIC PANEL
CO2: 24 mEq/L (ref 19–32)
Calcium: 9.6 mg/dL (ref 8.4–10.5)
Glucose, Bld: 92 mg/dL (ref 70–99)
Sodium: 142 mEq/L (ref 135–145)

## 2011-06-18 LAB — CBC
HCT: 39.7 % (ref 36.0–46.0)
Hemoglobin: 13 g/dL (ref 12.0–15.0)
MCH: 29 pg (ref 26.0–34.0)
MCV: 88.6 fL (ref 78.0–100.0)
Platelets: 226 10*3/uL (ref 150–400)
RBC: 4.48 MIL/uL (ref 3.87–5.11)

## 2011-06-18 LAB — URINALYSIS, ROUTINE W REFLEX MICROSCOPIC
Leukocytes, UA: NEGATIVE
Nitrite: NEGATIVE
Specific Gravity, Urine: 1.008 (ref 1.005–1.030)
Urobilinogen, UA: 0.2 mg/dL (ref 0.0–1.0)
pH: 6 (ref 5.0–8.0)

## 2011-06-18 LAB — DIFFERENTIAL
Eosinophils Absolute: 0.1 10*3/uL (ref 0.0–0.7)
Eosinophils Relative: 2 % (ref 0–5)
Lymphs Abs: 1.1 10*3/uL (ref 0.7–4.0)
Monocytes Absolute: 0.4 10*3/uL (ref 0.1–1.0)
Monocytes Relative: 8 % (ref 3–12)

## 2011-06-18 LAB — ABO/RH: ABO/RH(D): O POS

## 2011-06-18 LAB — TYPE AND SCREEN

## 2011-06-18 NOTE — Progress Notes (Signed)
Pt stated there are no current ekgs or cardiac studied.  Dr Madelin Rear  [facility]   [facility]

## 2011-06-18 NOTE — Pre-Procedure Instructions (Signed)
[address] LUNT  06/18/2011   Your procedure is scheduled on:  06/30/11  Report to [facility] at 1100 AM.  Call this number if you have problems the morning of surgery: [phone]   Remember:   Do not eat food:After Midnight.  May have clear liquids: up to 4 Hours before arrival.  Clear liquids include soda, tea, black coffee, apple or grape juice, broth.  Take these medicines the morning of surgery with A SIP OF WATER: aricept   Do not wear jewelry, make-up or nail polish.  Do not wear lotions, powders, or perfumes. You may wear deodorant.  Do not shave 48 hours prior to surgery. Men may shave face and neck.  Do not bring valuables to the hospital.  Contacts, dentures or bridgework may not be worn into surgery.  Leave suitcase in the car. After surgery it may be brought to your room.  For patients admitted to the hospital, checkout time is 11:00 AM the day of discharge.   Patients discharged the day of surgery will not be allowed to drive home.  Name and phone number of your driver: family  Special Instructions: CHG Shower Use Special Wash: 1/2 bottle night before surgery and 1/2 bottle morning of surgery.   Please read over the following fact sheets that you were given: Pain Booklet, Coughing and Deep Breathing, Blood Transfusion Information, MRSA Information and Surgical Site Infection Prevention

## 2011-06-22 DIAGNOSIS — Z79899 Other long term (current) drug therapy: Secondary | ICD-10-CM | POA: Diagnosis not present

## 2011-06-22 DIAGNOSIS — G3184 Mild cognitive impairment, so stated: Secondary | ICD-10-CM | POA: Diagnosis not present

## 2011-06-22 DIAGNOSIS — E782 Mixed hyperlipidemia: Secondary | ICD-10-CM | POA: Diagnosis not present

## 2011-06-22 DIAGNOSIS — I10 Essential (primary) hypertension: Secondary | ICD-10-CM | POA: Diagnosis not present

## 2011-06-22 DIAGNOSIS — R32 Unspecified urinary incontinence: Secondary | ICD-10-CM | POA: Diagnosis not present

## 2011-06-23 DIAGNOSIS — E782 Mixed hyperlipidemia: Secondary | ICD-10-CM | POA: Diagnosis not present

## 2011-06-23 DIAGNOSIS — Z79899 Other long term (current) drug therapy: Secondary | ICD-10-CM | POA: Diagnosis not present

## 2011-06-29 MED ORDER — DEXTROSE-NACL 5-0.45 % IV SOLN: INTRAVENOUS | Status: DC

## 2011-06-29 MED ORDER — CHLORHEXIDINE GLUCONATE 4 % EX LIQD: 60.0000 mL | Freq: Once | CUTANEOUS | Status: DC

## 2011-06-29 MED ORDER — CEFAZOLIN SODIUM-DEXTROSE 2-3 GM-% IV SOLR
2.0000 g | INTRAVENOUS | Status: DC
  Filled 2011-06-29: qty 50

## 2011-06-29 NOTE — H&P (Signed)
  Subjective: Patient is here to schedule right total knee arthroplasty.  She's had multiple series of cortisone injections, and 3 sets of Supartz injections which helped temporarily, but her pain is now bad enough that she is starting to fall down, she is bone on bone and she wants to go ahead and get her knee replaced.  Her husband has had both of his knees replaced  and she is well versed in the pre-and postoperative rehabilitation protocols.  She doesn't have any active medical problems at this time and has no history of heart disease, stroke, diabetes and only has mildly elevated blood pressure.  PAST MEDICAL HISTORY:  Significant for breast cancer that was treated surgically in 2008.  She also had a hysterectomy and cataract surgery.  She has no known drug allergies.   Current medications include Etodolac, HCTZ, Aricept, fish oil, and red yeast rice.   Social History:  She denies use of alcohol or tobacco.  She is married and retired.    Review of systems is negative aside from her musculoskeletal complaint. ROS: Patient denies dizziness, nausea, fever, chills, vomiting, shortness of breath, chest pain, loss of appetite, or rash.    PHYSICAL EXAM: Well-developed, well-nourished.  Awake, alert, and oriented x3.  Extraocular motion is intact.  No use of accessory respiratory muscles for breathing.   Cardiovascular exam reveals a regular rhythm.  Skin is intact without cuts, scrapes, or abrasions. The right knee has a significant valgus deformity of about 10, lacks 10 of full extension.  One plus effusion.  Previous x-rays are shown bone-on-bone arthritis to the lateral compartment.  She walks with a profound right-sided limp.  Normal pulses to the foot, normal sensation of the foot.  Asses: End-stage arthritis, right knee having failed conservative measures over the last 4 years.  Plan: Risks and benefits of knee replacement were discussed at length with the patient.  We'll get her set up for  right total knee arthroplasty sooner than later.  Norco 5 mg by mouth twice a day when necessary dispense 60 no refills.  Her plan is to go home after surgery she does not want to do rehabilitation

## 2011-06-30 ENCOUNTER — Ambulatory Visit (HOSPITAL_COMMUNITY): Payer: Medicare Other | Admitting: Certified Registered"

## 2011-06-30 ENCOUNTER — Encounter (HOSPITAL_COMMUNITY): Payer: Self-pay | Admitting: Certified Registered"

## 2011-06-30 ENCOUNTER — Inpatient Hospital Stay (HOSPITAL_COMMUNITY)
Admission: RE | Admit: 2011-06-30 | Discharge: 2011-07-04 | DRG: 470 | Disposition: A | Discharge status: Home Health | Payer: Medicare Other | Source: Ambulatory Visit | Attending: Orthopedic Surgery | Admitting: Orthopedic Surgery

## 2011-06-30 ENCOUNTER — Encounter (HOSPITAL_COMMUNITY)
Admission: RE | Disposition: A | Discharge status: Home Health | Payer: Self-pay | Source: Ambulatory Visit | Attending: Orthopedic Surgery

## 2011-06-30 ENCOUNTER — Encounter (HOSPITAL_COMMUNITY): Payer: Self-pay | Admitting: *Deleted

## 2011-06-30 DIAGNOSIS — Z79899 Other long term (current) drug therapy: Secondary | ICD-10-CM

## 2011-06-30 DIAGNOSIS — G8918 Other acute postprocedural pain: Secondary | ICD-10-CM | POA: Diagnosis not present

## 2011-06-30 DIAGNOSIS — M1710 Unilateral primary osteoarthritis, unspecified knee: Secondary | ICD-10-CM | POA: Diagnosis not present

## 2011-06-30 DIAGNOSIS — I10 Essential (primary) hypertension: Secondary | ICD-10-CM | POA: Diagnosis present

## 2011-06-30 DIAGNOSIS — Z7982 Long term (current) use of aspirin: Secondary | ICD-10-CM | POA: Diagnosis not present

## 2011-06-30 DIAGNOSIS — M1711 Unilateral primary osteoarthritis, right knee: Secondary | ICD-10-CM | POA: Diagnosis present

## 2011-06-30 DIAGNOSIS — IMO0002 Reserved for concepts with insufficient information to code with codable children: Secondary | ICD-10-CM | POA: Diagnosis not present

## 2011-06-30 DIAGNOSIS — Z853 Personal history of malignant neoplasm of breast: Secondary | ICD-10-CM | POA: Diagnosis not present

## 2011-06-30 DIAGNOSIS — F0390 Unspecified dementia without behavioral disturbance: Secondary | ICD-10-CM | POA: Diagnosis present

## 2011-06-30 HISTORY — PX: TOTAL KNEE ARTHROPLASTY: SHX125

## 2011-06-30 SURGERY — ARTHROPLASTY, KNEE, TOTAL
Anesthesia: General | Site: Knee | Laterality: Right | Wound class: Clean

## 2011-06-30 SURGERY — ARTHROPLASTY, KNEE, TOTAL
Anesthesia: Regional | Laterality: Right

## 2011-06-30 MED ORDER — FLEET ENEMA 7-19 GM/118ML RE ENEM: 1.0000 | ENEMA | Freq: Once | RECTAL | Status: AC | PRN

## 2011-06-30 MED ORDER — MENTHOL 3 MG MT LOZG: 1.0000 | LOZENGE | OROMUCOSAL | Status: DC | PRN

## 2011-06-30 MED ORDER — ONDANSETRON HCL 4 MG/2ML IJ SOLN
4.0000 mg | Freq: Four times a day (QID) | INTRAMUSCULAR | Status: DC | PRN
  Administered 2011-07-01: 4 mg via INTRAVENOUS
  Filled 2011-06-30: qty 2

## 2011-06-30 MED ORDER — HYDRALAZINE HCL 20 MG/ML IJ SOLN
INTRAMUSCULAR | Status: DC | PRN
  Administered 2011-06-30 (×2): 5 mg via INTRAVENOUS

## 2011-06-30 MED ORDER — HYDROMORPHONE HCL PF 1 MG/ML IJ SOLN
0.5000 mg | INTRAMUSCULAR | Status: DC | PRN
  Administered 2011-06-30: 0.5 mg via INTRAVENOUS
  Administered 2011-06-30 – 2011-07-01 (×3): 1 mg via INTRAVENOUS
  Filled 2011-06-30 (×5): qty 1

## 2011-06-30 MED ORDER — DEXTROSE 5 % IV SOLN
500.0000 mg | Freq: Four times a day (QID) | INTRAVENOUS | Status: DC | PRN
  Administered 2011-06-30: 500 mg via INTRAVENOUS
  Filled 2011-06-30: qty 5

## 2011-06-30 MED ORDER — MAGNESIUM HYDROXIDE 400 MG/5ML PO SUSP: 30.0000 mL | Freq: Every day | ORAL | Status: DC | PRN

## 2011-06-30 MED ORDER — CEFUROXIME SODIUM 1.5 G IJ SOLR
INTRAMUSCULAR | Status: DC | PRN
  Administered 2011-06-30: 1.5 g

## 2011-06-30 MED ORDER — BUPIVACAINE-EPINEPHRINE PF 0.5-1:200000 % IJ SOLN
INTRAMUSCULAR | Status: DC | PRN
  Administered 2011-06-30: 150 mg

## 2011-06-30 MED ORDER — LISINOPRIL 10 MG PO TABS
10.0000 mg | ORAL_TABLET | Freq: Every day | ORAL | Status: DC
  Administered 2011-06-30 – 2011-07-04 (×5): 10 mg via ORAL
  Filled 2011-06-30 (×5): qty 1

## 2011-06-30 MED ORDER — METOCLOPRAMIDE HCL 10 MG PO TABS: 5.0000 mg | ORAL_TABLET | Freq: Three times a day (TID) | ORAL | Status: DC | PRN

## 2011-06-30 MED ORDER — PHENOL 1.4 % MT LIQD: 1.0000 | OROMUCOSAL | Status: DC | PRN

## 2011-06-30 MED ORDER — LISINOPRIL-HYDROCHLOROTHIAZIDE 10-12.5 MG PO TABS: 1.0000 | ORAL_TABLET | Freq: Every day | ORAL | Status: DC

## 2011-06-30 MED ORDER — HYDROCHLOROTHIAZIDE 12.5 MG PO CAPS
12.5000 mg | ORAL_CAPSULE | Freq: Every day | ORAL | Status: DC
  Administered 2011-06-30 – 2011-07-04 (×5): 12.5 mg via ORAL
  Filled 2011-06-30 (×5): qty 1

## 2011-06-30 MED ORDER — DONEPEZIL HCL 10 MG PO TABS
10.0000 mg | ORAL_TABLET | Freq: Every day | ORAL | Status: DC
  Administered 2011-07-01 – 2011-07-04 (×4): 10 mg via ORAL
  Filled 2011-06-30 (×4): qty 1

## 2011-06-30 MED ORDER — HYDROMORPHONE HCL PF 1 MG/ML IJ SOLN
0.2500 mg | INTRAMUSCULAR | Status: DC | PRN
  Administered 2011-06-30 (×4): 0.5 mg via INTRAVENOUS

## 2011-06-30 MED ORDER — KCL IN DEXTROSE-NACL 20-5-0.45 MEQ/L-%-% IV SOLN
INTRAVENOUS | Status: DC
  Administered 2011-06-30 – 2011-07-01 (×2): via INTRAVENOUS
  Filled 2011-06-30 (×10): qty 1000

## 2011-06-30 MED ORDER — HYDROCODONE-ACETAMINOPHEN 5-325 MG PO TABS
1.0000 | ORAL_TABLET | ORAL | Status: DC | PRN
  Administered 2011-07-01 – 2011-07-02 (×6): 2 via ORAL
  Administered 2011-07-03 – 2011-07-04 (×4): 1 via ORAL
  Filled 2011-06-30 (×8): qty 2
  Filled 2011-06-30 (×2): qty 1
  Filled 2011-06-30 (×2): qty 2

## 2011-06-30 MED ORDER — SODIUM CHLORIDE 0.9 % IR SOLN
Status: DC | PRN
  Administered 2011-06-30: 3000 mL

## 2011-06-30 MED ORDER — ASPIRIN 325 MG PO TABS
325.0000 mg | ORAL_TABLET | Freq: Two times a day (BID) | ORAL | Status: DC
  Administered 2011-06-30 – 2011-07-04 (×7): 325 mg via ORAL
  Filled 2011-06-30 (×10): qty 1

## 2011-06-30 MED ORDER — ACETAMINOPHEN 650 MG RE SUPP: 650.0000 mg | Freq: Four times a day (QID) | RECTAL | Status: DC | PRN

## 2011-06-30 MED ORDER — FENTANYL CITRATE 0.05 MG/ML IJ SOLN
50.0000 ug | INTRAMUSCULAR | Status: DC | PRN
  Administered 2011-06-30: 100 ug via INTRAVENOUS

## 2011-06-30 MED ORDER — BISACODYL 5 MG PO TBEC
5.0000 mg | DELAYED_RELEASE_TABLET | Freq: Every day | ORAL | Status: DC | PRN
  Administered 2011-07-02: 5 mg via ORAL
  Filled 2011-06-30: qty 1

## 2011-06-30 MED ORDER — PROPOFOL 10 MG/ML IV EMUL
INTRAVENOUS | Status: DC | PRN
  Administered 2011-06-30: 150 mg via INTRAVENOUS
  Administered 2011-06-30: 20 mg via INTRAVENOUS

## 2011-06-30 MED ORDER — MIDAZOLAM HCL 2 MG/2ML IJ SOLN
INTRAMUSCULAR | Status: AC
  Filled 2011-06-30: qty 2

## 2011-06-30 MED ORDER — METHOCARBAMOL 500 MG PO TABS
500.0000 mg | ORAL_TABLET | Freq: Four times a day (QID) | ORAL | Status: DC | PRN
  Administered 2011-07-01: 500 mg via ORAL
  Filled 2011-06-30 (×2): qty 1

## 2011-06-30 MED ORDER — ACETAMINOPHEN 325 MG PO TABS
650.0000 mg | ORAL_TABLET | Freq: Four times a day (QID) | ORAL | Status: DC | PRN
  Filled 2011-06-30: qty 2

## 2011-06-30 MED ORDER — LACTATED RINGERS IV SOLN
INTRAVENOUS | Status: DC
  Administered 2011-06-30: 13:00:00 via INTRAVENOUS

## 2011-06-30 MED ORDER — ONDANSETRON HCL 4 MG PO TABS: 4.0000 mg | ORAL_TABLET | Freq: Four times a day (QID) | ORAL | Status: DC | PRN

## 2011-06-30 MED ORDER — 0.9 % SODIUM CHLORIDE (POUR BTL) OPTIME
TOPICAL | Status: DC | PRN
  Administered 2011-06-30: 1000 mL

## 2011-06-30 MED ORDER — DIPHENHYDRAMINE HCL 12.5 MG/5ML PO ELIX: 12.5000 mg | ORAL_SOLUTION | ORAL | Status: DC | PRN

## 2011-06-30 MED ORDER — LACTATED RINGERS IV SOLN
INTRAVENOUS | Status: DC | PRN
  Administered 2011-06-30 (×2): via INTRAVENOUS

## 2011-06-30 MED ORDER — ZOLPIDEM TARTRATE 5 MG PO TABS: 5.0000 mg | ORAL_TABLET | Freq: Every evening | ORAL | Status: DC | PRN

## 2011-06-30 MED ORDER — FENTANYL CITRATE 0.05 MG/ML IJ SOLN
INTRAMUSCULAR | Status: AC
  Filled 2011-06-30: qty 2

## 2011-06-30 MED ORDER — FENTANYL CITRATE 0.05 MG/ML IJ SOLN
INTRAMUSCULAR | Status: DC | PRN
  Administered 2011-06-30 (×2): 50 ug via INTRAVENOUS
  Administered 2011-06-30 (×2): 100 ug via INTRAVENOUS

## 2011-06-30 MED ORDER — METOCLOPRAMIDE HCL 5 MG/ML IJ SOLN: 5.0000 mg | Freq: Three times a day (TID) | INTRAMUSCULAR | Status: DC | PRN

## 2011-06-30 MED ORDER — CEFAZOLIN SODIUM 1-5 GM-% IV SOLN
INTRAVENOUS | Status: DC | PRN
  Administered 2011-06-30: 2 g via INTRAVENOUS

## 2011-06-30 MED ORDER — ALUM & MAG HYDROXIDE-SIMETH 200-200-20 MG/5ML PO SUSP: 30.0000 mL | ORAL | Status: DC | PRN

## 2011-06-30 MED ORDER — MIDAZOLAM HCL 2 MG/2ML IJ SOLN
1.0000 mg | INTRAMUSCULAR | Status: DC | PRN
  Administered 2011-06-30: 1 mg via INTRAVENOUS

## 2011-06-30 MED ORDER — OXYCODONE HCL 5 MG PO TABS
5.0000 mg | ORAL_TABLET | ORAL | Status: DC | PRN
  Administered 2011-07-01: 10 mg via ORAL
  Filled 2011-06-30: qty 2

## 2011-06-30 MED ORDER — DROPERIDOL 2.5 MG/ML IJ SOLN: 0.6250 mg | INTRAMUSCULAR | Status: DC | PRN

## 2011-06-30 SURGICAL SUPPLY — 53 items
BANDAGE ESMARK 6X9 LF (GAUZE/BANDAGES/DRESSINGS) ×1 IMPLANT
BLADE SAG 18X100X1.27 (BLADE) ×2 IMPLANT
BLADE SAW SGTL 13X75X1.27 (BLADE) ×2 IMPLANT
BLADE SURG ROTATE 9660 (MISCELLANEOUS) IMPLANT
BNDG ELASTIC 6X10 VLCR STRL LF (GAUZE/BANDAGES/DRESSINGS) ×2 IMPLANT
BNDG ESMARK 6X9 LF (GAUZE/BANDAGES/DRESSINGS) ×2
BOWL SMART MIX CTS (DISPOSABLE) ×2 IMPLANT
CEMENT HV SMART SET (Cement) ×4 IMPLANT
CLOTH BEACON ORANGE TIMEOUT ST (SAFETY) ×2 IMPLANT
COVER BACK TABLE 24X17X13 BIG (DRAPES) IMPLANT
COVER SURGICAL LIGHT HANDLE (MISCELLANEOUS) ×2 IMPLANT
CUFF TOURNIQUET SINGLE 34IN LL (TOURNIQUET CUFF) ×2 IMPLANT
CUFF TOURNIQUET SINGLE 44IN (TOURNIQUET CUFF) IMPLANT
DRAPE EXTREMITY T 121X128X90 (DRAPE) ×2 IMPLANT
DRAPE U-SHAPE 47X51 STRL (DRAPES) ×2 IMPLANT
DURAPREP 26ML APPLICATOR (WOUND CARE) ×2 IMPLANT
ELECT REM PT RETURN 9FT ADLT (ELECTROSURGICAL) ×2
ELECTRODE REM PT RTRN 9FT ADLT (ELECTROSURGICAL) ×1 IMPLANT
EVACUATOR 1/8 PVC DRAIN (DRAIN) ×2 IMPLANT
GAUZE XEROFORM 1X8 LF (GAUZE/BANDAGES/DRESSINGS) ×2 IMPLANT
GLOVE BIO SURGEON STRL SZ7 (GLOVE) ×4 IMPLANT
GLOVE BIO SURGEON STRL SZ7.5 (GLOVE) ×4 IMPLANT
GLOVE BIOGEL PI IND STRL 7.0 (GLOVE) ×3 IMPLANT
GLOVE BIOGEL PI IND STRL 8 (GLOVE) ×1 IMPLANT
GLOVE BIOGEL PI INDICATOR 7.0 (GLOVE) ×3
GLOVE BIOGEL PI INDICATOR 8 (GLOVE) ×1
GLOVE SURG SS PI 6.5 STRL IVOR (GLOVE) ×4 IMPLANT
GOWN PREVENTION PLUS XLARGE (GOWN DISPOSABLE) IMPLANT
GOWN STRL NON-REIN LRG LVL3 (GOWN DISPOSABLE) IMPLANT
HANDPIECE INTERPULSE COAX TIP (DISPOSABLE) ×1
HOOD PEEL AWAY FACE SHEILD DIS (HOOD) ×4 IMPLANT
KIT BASIN OR (CUSTOM PROCEDURE TRAY) ×2 IMPLANT
KIT ROOM TURNOVER OR (KITS) ×2 IMPLANT
MANIFOLD NEPTUNE II (INSTRUMENTS) ×2 IMPLANT
NS IRRIG 1000ML POUR BTL (IV SOLUTION) ×2 IMPLANT
PACK TOTAL JOINT (CUSTOM PROCEDURE TRAY) ×2 IMPLANT
PAD ARMBOARD 7.5X6 YLW CONV (MISCELLANEOUS) ×2 IMPLANT
PADDING CAST COTTON 6X4 STRL (CAST SUPPLIES) ×2 IMPLANT
SET HNDPC FAN SPRY TIP SCT (DISPOSABLE) ×1 IMPLANT
SPONGE GAUZE 4X4 12PLY (GAUZE/BANDAGES/DRESSINGS) ×2 IMPLANT
STAPLER VISISTAT 35W (STAPLE) ×2 IMPLANT
SUCTION FRAZIER TIP 10 FR DISP (SUCTIONS) ×2 IMPLANT
SURGIFLO TRUKIT (HEMOSTASIS) IMPLANT
SUT VIC AB 0 CTX 36 (SUTURE) ×1
SUT VIC AB 0 CTX36XBRD ANTBCTR (SUTURE) ×1 IMPLANT
SUT VIC AB 1 CTX 36 (SUTURE) ×1
SUT VIC AB 1 CTX36XBRD ANBCTR (SUTURE) ×1 IMPLANT
SUT VIC AB 2-0 CT1 27 (SUTURE) ×1
SUT VIC AB 2-0 CT1 TAPERPNT 27 (SUTURE) ×1 IMPLANT
TOWEL OR 17X24 6PK STRL BLUE (TOWEL DISPOSABLE) IMPLANT
TOWEL OR 17X26 10 PK STRL BLUE (TOWEL DISPOSABLE) IMPLANT
TRAY FOLEY CATH 14FR (SET/KITS/TRAYS/PACK) ×2 IMPLANT
WATER STERILE IRR 1000ML POUR (IV SOLUTION) ×2 IMPLANT

## 2011-06-30 NOTE — Op Note (Signed)
PATIENT ID:      Katie Carrillo  MRN:     [id number] DOB/AGE:    70/02/1940 / 70 y.o.       OPERATIVE REPORT    DATE OF PROCEDURE:  06/30/2011       PREOPERATIVE DIAGNOSIS:   osteoarthritis, right knee      There is no height or weight on file to calculate BMI.                                                        POSTOPERATIVE DIAGNOSIS:   osteoarthritis, right knee                                                                      PROCEDURE:  Procedure(s): R TOTAL KNEE ARTHROPLASTY Using Depuy Sigma RP implants #4RFemur, #4Tibia, 10mm sigma RP bearing, 38 Patella     SURGEON: Bryann Gentz J    ASSISTANT:   Shirl Harris PA-C   (Present and scrubbed throughout the case, critical for assistance with exposure, retraction, instrumentation, and closure.)         ANESTHESIA: GET with Femoral Nerve Block  DRAINS: foley, 2 medium hemovac in knee   TOURNIQUET TIME:   COMPLICATIONS:  None     SPECIMENS: None   INDICATIONS FOR PROCEDURE: The patient has  osteoarthritis, right knee, varus deformities, XR shows bone on bone arthritis. Patient has failed all conservative measures including anti-inflammatory medicines, narcotics, attempts at  exercise and weight loss, cortisone injections and viscosupplementation.  Risks and benefits of surgery have been discussed, questions answered.   DESCRIPTION OF PROCEDURE: The patient identified by armband, received  right femoral nerve block and IV antibiotics, in the holding area at [facility]. Patient taken to the operating room, appropriate anesthetic  monitors were attached General endotracheal anesthesia induced with  the patient in supine position, Foley catheter was inserted. Tourniquet  applied high to the operative thigh. Lateral post and foot positioner  applied to the table, the lower extremity was then prepped and draped  in usual sterile fashion from the ankle to the tourniquet. Time-out procedure was performed. The limb  was wrapped with an Esmarch bandage and the tourniquet inflated to 350 mmHg. We began the operation by making the anterior midline incision starting at handbreadth above the patella going over the patella 1 cm medial to and  4 cm distal to the tibial tubercle. Small bleeders in the skin and the  subcutaneous tissue identified and cauterized. Transverse retinaculum was incised and reflected medially and a medial parapatellar arthrotomy was accomplished. the patella was everted and theprepatellar fat pad resected. The superficial medial collateral  ligament was then elevated from anterior to posterior along the proximal  flare of the tibia and anterior half of the menisci resected. The knee was hyperflexed exposing bone on bone arthritis. Peripheral and notch osteophytes as well as the cruciate ligaments were then resected. We continued to  work our way around posteriorly along the proximal tibia, and externally  rotated the tibia subluxing it out from underneath the femur.  A McHale  retractor was placed through the notch and a lateral Hohmann retractor  placed, and we then drilled through the proximal tibia in line with the  axis of the tibia followed by an intramedullary guide rod and 2-degree  posterior slope cutting guide. The tibial cutting guide was pinned into place  allowing resection of 8 mm of bone medially and about 8 mm of bone  laterally because of her varus deformity. Satisfied with the tibial resection, we then  entered the distal femur 2 mm anterior to the PCL origin with the  intramedullary guide rod and applied the distal femoral cutting guide  set at 11mm, with 5 degrees of valgus. This was pinned along the  epicondylar axis. At this point, the distal femoral cut was accomplished without difficulty. We then sized for a #4R femoral component and pinned the guide in 3 degrees of external rotation.The chamfer cutting guide was pinned into place. The anterior, posterior, and chamfer cuts  were accomplished without difficulty followed by  the Sigma RP box cutting guide and the box cut. We also removed posterior osteophytes from the posterior femoral condyles. At this  time, the knee was brought into full extension. We checked our  extension and flexion gaps and found them symmetric at 10mm.  The patella thickness measured at 26 mm. We set the cutting guide at 16 and removed the posterior 9.5-10 mm  of the patella sized for 38 button and drilled the lollipop. The knee  was then once again hyperflexed exposing the proximal tibia. We sized for a #4 tibial base plate, applied the smokestack and the conical reamer followed by the the Delta fin keel punch. We then hammered into place the Sigma RP trial femoral component, inserted a 10-mm trial bearing, trial patellar button, and took the knee through range of motion from 0-130 degrees. No thumb pressure was required for patellar  tracking. At this point, all trial components were removed, a double batch of DePuy HV cement with 1500 mg of Zinacef was mixed and applied to all bony metallic mating surfaces except for the posterior condyles of the femur itself. In order, we  hammered into place the tibial tray and removed excess cement, the femoral component and removed excess cement, a 10-mm Sigma RP bearing  was inserted, and the knee brought to full extension with compression.  The patellar button was clamped into place, and excess cement  removed. While the cement cured the wound was irrigated out with normal saline solution pulse lavage, and medium Hemovac drains were placed from an anterolateral  approach. Ligament stability and patellar tracking were checked and found to be excellent. The parapatellar arthrotomy was closed with  running #1 Vicryl suture. The subcutaneous tissue with 0 and 2-0 undyed  Vicryl suture, and the skin with skin staples. A dressing of Xeroform,  4 x 4, dressing sponges, Webril, and Ace wrap applied. The patient    awakened, extubated, and taken to recovery room without difficulty.   Gean Birchwood J 06/30/2011, 2:48 PM

## 2011-06-30 NOTE — Anesthesia Preprocedure Evaluation (Signed)
Anesthesia Evaluation  Patient identified by MRN, date of birth, ID band Patient awake    Reviewed: Allergy & Precautions, H&P , NPO status , Patient's Chart, lab work & pertinent test results  Airway Mallampati: II TM Distance: >3 FB     Dental  (+) Missing and Dental Advisory Given   Pulmonary neg pulmonary ROS,  breath sounds clear to auscultation  Pulmonary exam normal       Cardiovascular hypertension, Pt. on medications Rhythm:Regular Rate:Normal     Neuro/Psych    GI/Hepatic negative GI ROS, Neg liver ROS,   Endo/Other  negative endocrine ROS  Renal/GU negative Renal ROS     Musculoskeletal   Abdominal   Peds  Hematology   Anesthesia Other Findings   Reproductive/Obstetrics                           Anesthesia Physical Anesthesia Plan  ASA: III  Anesthesia Plan: General   Post-op Pain Management:    Induction: Intravenous  Airway Management Planned: LMA  Additional Equipment:   Intra-op Plan:   Post-operative Plan:   Informed Consent: I have reviewed the patients History and Physical, chart, labs and discussed the procedure including the risks, benefits and alternatives for the proposed anesthesia with the patient or authorized representative who has indicated his/her understanding and acceptance.   Dental advisory given  Plan Discussed with: CRNA, Anesthesiologist and Surgeon  Anesthesia Plan Comments:         Anesthesia Quick Evaluation

## 2011-06-30 NOTE — Transfer of Care (Signed)
Immediate Anesthesia Transfer of Care Note  Patient: Katie Carrillo  Procedure(s) Performed: Procedure(s) (LRB): TOTAL KNEE ARTHROPLASTY (Right)  Patient Location: PACU  Anesthesia Type: General  Level of Consciousness: awake, alert , oriented and patient cooperative  Airway & Oxygen Therapy: Patient Spontanous Breathing and Patient connected to face mask oxygen  Post-op Assessment: Report given to PACU RN  Post vital signs: Reviewed and stable  Complications: No apparent anesthesia complications

## 2011-06-30 NOTE — Anesthesia Procedure Notes (Addendum)
Anesthesia Regional Block:  Femoral nerve block  Pre-Anesthetic Checklist: ,, timeout performed, Correct Patient, Correct Site, Correct Laterality, Correct Procedure,, site marked, risks and benefits discussed, Surgical consent,  Pre-op evaluation,  At surgeon's request and post-op pain management  Laterality: Right  Prep: chloraprep       Needles:  Injection technique: Single-shot  Needle Type: Echogenic Stimulator Needle     Needle Length: 5cm 5 cm Needle Gauge: 22 and 22 G    Additional Needles:  Procedures: ultrasound guided and nerve stimulator Femoral nerve block  Nerve Stimulator or Paresthesia:  Response: quadraceps contraction, 0.45 mA,   Additional Responses:   Narrative:  Start time: 06/30/2011 12:31 PM End time: 06/30/2011 12:44 PM Injection made incrementally with aspirations every 5 mL.  Performed by: Personally  Anesthesiologist: Halford Decamp, MD  Additional Notes: Functioning IV was confirmed and monitors were applied.  A 50mm 22ga Arrow echogenic stimulator needle was used. Sterile prep and drape,hand hygiene and sterile gloves were used. Ultrasound guidance: relevant anatomy identified, needle position confirmed, local anesthetic spread visualized around nerve(s)., vascular puncture avoided.  Image printed for medical record. Negative aspiration and negative test dose prior to incremental administration of local anesthetic. The patient tolerated the procedure well.    Femoral nerve block Procedure Name: LMA Insertion Date/Time: 06/30/2011 1:02 PM Performed by: Glendora Score A Pre-anesthesia Checklist: Patient identified, Emergency Drugs available, Suction available and Patient being monitored Patient Re-evaluated:Patient Re-evaluated prior to inductionOxygen Delivery Method: Circle system utilized Preoxygenation: Pre-oxygenation with 100% oxygen Intubation Type: IV induction LMA: LMA with gastric port inserted LMA Size: 4.0 Number of attempts:  1 Placement Confirmation: positive ETCO2 and breath sounds checked- equal and bilateral Tube secured with: Tape Dental Injury: Teeth and Oropharynx as per pre-operative assessment

## 2011-06-30 NOTE — Interval H&P Note (Signed)
History and Physical Interval Note:  06/30/2011 12:40 PM  Katie Carrillo  has presented today for surgery, with the diagnosis of osteoarthritis, right knee  The various methods of treatment have been discussed with the patient and family. After consideration of risks, benefits and other options for treatment, the patient has consented to  Procedure(s) (LRB): TOTAL KNEE ARTHROPLASTY (Right) as a surgical intervention .  The patients' history has been reviewed, patient examined, no change in status, stable for surgery.  I have reviewed the patients' chart and labs.  Questions were answered to the patient's satisfaction.     [name]

## 2011-06-30 NOTE — Anesthesia Postprocedure Evaluation (Signed)
Anesthesia Post Note  Patient: Katie Carrillo  Procedure(s) Performed: Procedure(s) (LRB): TOTAL KNEE ARTHROPLASTY (Right)  Anesthesia type: general  Patient location: PACU  Post pain: Pain level controlled  Post assessment: Patient's Cardiovascular Status Stable  Last Vitals:  Filed Vitals:   06/30/11 1447  BP:   Pulse:   Temp:   Resp: 33    Post vital signs: Reviewed and stable  Level of consciousness: sedated  Complications: No apparent anesthesia complications

## 2011-06-30 NOTE — Preoperative (Signed)
Beta Blockers   Reason not to administer Beta Blockers:Not Applicable 

## 2011-07-01 ENCOUNTER — Encounter (HOSPITAL_COMMUNITY): Payer: Self-pay | Admitting: Orthopedic Surgery

## 2011-07-01 LAB — BASIC METABOLIC PANEL
BUN: 12 mg/dL (ref 6–23)
Chloride: 98 mEq/L (ref 96–112)
Glucose, Bld: 128 mg/dL — ABNORMAL HIGH (ref 70–99)
Potassium: 3.5 mEq/L (ref 3.5–5.1)
Sodium: 137 mEq/L (ref 135–145)

## 2011-07-01 LAB — CBC
HCT: 30.8 % — ABNORMAL LOW (ref 36.0–46.0)
Hemoglobin: 9.9 g/dL — ABNORMAL LOW (ref 12.0–15.0)
RBC: 3.48 MIL/uL — ABNORMAL LOW (ref 3.87–5.11)
WBC: 10.5 10*3/uL (ref 4.0–10.5)

## 2011-07-01 MED FILL — Hydromorphone HCl Inj 1 MG/ML: INTRAMUSCULAR | Qty: 1 | Status: AC

## 2011-07-01 NOTE — Progress Notes (Signed)
Physical Therapy Treatment Note   07/01/11 1420  PT Visit Information  Last PT Received On 07/01/11  Assistance Needed +1  PT Time Calculation  PT Start Time 1420  PT Stop Time 1447  PT Time Calculation (min) 27 min  Subjective Data  Subjective Pt received supine in bed with report of feeling much better. Patient with improved alertness.  Precautions  Precautions Knee  Restrictions  RLE Weight Bearing WBAT  Cognition  Overall Cognitive Status Appears within functional limits for tasks assessed/performed  Arousal/Alertness Awake/alert  Orientation Level Oriented X4 / Intact  Behavior During Session [facility] for tasks performed  Bed Mobility  Bed Mobility Supine to Sit  Supine to Sit 3: Mod assist;HOB flat  Details for Bed Mobility Assistance max directional cues, minA at R LE and trunk elevation  Transfers  Transfers Sit to Stand;Stand to Sit  Sit to Stand 3: Mod assist;With upper extremity assist;From chair/3-in-1;With armrests  Stand to Sit 3: Mod assist;To chair/3-in-1;To bed  Details for Transfer Assistance pt with teach back of proper hand placement  Ambulation/Gait  Ambulation/Gait Assistance 4: Min assist  Ambulation Distance (Feet) 50 Feet  Assistive device Rolling walker  Ambulation/Gait Assistance Details pt with improved sequencing and WBing through R LE  Gait Pattern Step-to pattern;Decreased step length - right;Decreased stance time - right;Antalgic  Gait velocity slow  Total Joint Exercises  Ankle Circles/Pumps AROM;Both;10 reps;Supine  Quad Sets AROM;Right;10 reps;Supine  Heel Slides AAROM;Right;Supine;10 reps  PT - End of Session  Equipment Utilized During Treatment Gait belt  Activity Tolerance Patient limited by fatigue;Patient limited by pain  Patient left in chair;with call bell/phone within reach;with family/visitor present  Nurse Communication Mobility status  PT - Assessment/Plan  Comments on Treatment Session Pt with significant improvement this PM. Pt  ambulation remains limited by onset of nausea. Patient encouraged to eat and provided pt with ensure pudding, shake and ginger ale. Pt reports "This taste good."  PT Plan Discharge plan remains appropriate;Frequency remains appropriate  PT Frequency 7X/week  Follow Up Recommendations Home health PT;Supervision/Assistance - 24 hour  Equipment Recommended None recommended by PT  Acute Rehab PT Goals  Time For Goal Achievement 07/08/11  Potential to Achieve Goals Good  PT Goal: Supine/Side to Sit - Progress Progressing toward goal  PT Goal: Ambulate - Progress Progressing toward goal  PT Goal: Perform Home Exercise Program - Progress Progressing toward goal    Pain: pt did not report  Lewis Shock, PT, DPT Pager #: [phone] Office #: [phone]

## 2011-07-01 NOTE — Progress Notes (Signed)
Patient ID: Katie Carrillo, female   DOB: 02-07-1940, 71 y.o.   MRN: [id number] PATIENT ID: Katie Carrillo  MRN: [id number]  DOB/AGE:  August 21, 1940 / 71 y.o.  1 Day Post-Op Procedure(s) (LRB): TOTAL KNEE ARTHROPLASTY (Right)    PROGRESS NOTE Subjective: Patient is alert, oriented, no Nausea, 1x Vomiting yesterday, no passing gas, no Bowel Movement. Taking PO sips today ok. Denies SOB, Chest or Calf Pain. Using Incentive Spirometer, PAS in place. Ambulate WBAT, CPM 0-30 Patient reports pain as 7 on 0-10 scale  .    Objective: Vital signs in last 24 hours: Filed Vitals:   06/30/11 1645 06/30/11 1646 06/30/11 2119 07/01/11 0544  BP: 136/63  131/76 139/70  Pulse: 73 75 68 73  Temp: 97.7 F (36.5 C)  98.2 F (36.8 C) 99.3 F (37.4 C)  TempSrc: Oral  Oral Oral  Resp: 12 12 16 18   SpO2: 97% 97% 93% 97%      Intake/Output from previous day: I/O last 3 completed shifts: In: 1500 [I.V.:1500] Out: 1750 [Urine:1475; Drains:275]   Intake/Output this shift:     LABORATORY DATA:  Basename 07/01/11 0622  WBC 10.5  HGB 9.9*  HCT 30.8*  PLT 187  NA 137  K 3.5  CL 98  CO2 29  BUN 12  CREATININE 0.72  GLUCOSE 128*  GLUCAP --  INR --  CALCIUM 8.5    Examination: Neurologically intact ABD soft Neurovascular intact Sensation intact distally Intact pulses distally Dorsiflexion/Plantar flexion intact Incision: no drainage No cellulitis present Compartment soft} Blood and plasma separated in drain indicating minimal recent drainage, drain pulled without difficulty. Applied 50lbs pressure with no problem.   Assessment:   1 Day Post-Op Procedure(s) (LRB): TOTAL KNEE ARTHROPLASTY (Right) ADDITIONAL DIAGNOSIS:    Plan: PT/OT WBAT, CPM 5/hrs day until ROM 0-90 degrees, then D/C CPM DVT Prophylaxis:  SCDx72hr\Coumadin for 2 weeks target INR 1.5-2.0 DISCHARGE PLAN: Home DISCHARGE NEEDS: HHPT, HHRN, CPM, Walker and 3-in-1 comode seat     Arvis Zwahlen J 07/01/2011, 8:22 AM

## 2011-07-01 NOTE — Progress Notes (Signed)
Referral received for SNF. Chart reviewed and CSW has spoken with RNCM who indicates that patient is for DC to home with Home Health and DME.  Patient does not require SNF placement.  CSW to sign off. Please re-consult if CSW needs arise.  Lorri Frederick. Jaci Lazier, MSW  [phone]

## 2011-07-01 NOTE — Progress Notes (Signed)
Physical Therapy Evaluation Note  Past Medical History  Diagnosis Date  . Cancer     breast      lumpectomy   lt  . DEMENTIA   . Urinary urgency   . Hypertension     dr Madelin Rear    [employer]    Past Surgical History  Procedure Date  . Breast surgery     lt  . Abdominal hysterectomy   . Hemmoriods       07/01/11 0810  PT Visit Information  Last PT Received On 07/01/11  Assistance Needed +2 (due to lethargy, lines, and safety)  PT Time Calculation  PT Start Time 0810  PT Stop Time 0846  PT Time Calculation (min) 36 min  Subjective Data  Subjective Pt received supine in bed with 6/10 R knee pain.  Patient Stated Goal I'm going home.  Precautions  Precautions Knee  Required Braces or Orthoses (none)  Restrictions  Weight Bearing Restrictions Yes  RLE Weight Bearing WBAT  Home Living  Lives With Spouse  Available Help at Discharge Family;Available PRN/intermittently (alone 2 hrs a day)  Type of Home House  Home Access Stairs to enter  Entrance Stairs-Number of Steps 5  Entrance Stairs-Rails Right  Home Layout One level  Bathroom Shower/Tub Tub/shower unit;Walk-in shower;Curtain (door on walk in shower)  Horticulturist, commercial Yes  How Accessible Accessible via walker  Home Adaptive Equipment Bedside commode/3-in-1;Walker - rolling;Shower chair with back  Prior Function  Level of Independence Independent  Able to Take Stairs? 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 Pt with increased difficulty maintaining eye opening s/p receiving pain medicine.    Right Upper Extremity Assessment  RUE ROM/Strength/Tone WFL  Left Upper Extremity Assessment  LUE ROM/Strength/Tone WFL  Right Lower Extremity Assessment  RLE ROM/Strength/Tone Deficits;Due to pain  RLE ROM/Strength/Tone Deficits minimal quad set on R , minimal active R knee ROM  Left Lower Extremity Assessment  LLE ROM/Strength/Tone WFL for tasks assessed  Trunk Assessment  Trunk Assessment Normal  Bed Mobility  Bed Mobility Supine to Sit  Supine to Sit 3: Mod assist;HOB flat  Details for Bed Mobility Assistance max directional v/c's for sequencing, initial assist for R LE, minA for trunk elevation due to decreased comprehension of long sit technique   Transfers  Transfers Sit to Stand;Stand to Sit  Sit to Stand 3: Mod assist;With upper extremity assist;From bed  Stand to Sit 4: Min assist;With armrests;To chair/3-in-1  Details for Transfer Assistance directional cues for hand placement and R LE management. modA to achieve full upright posture  Ambulation/Gait  Ambulation/Gait Assistance 4: Min assist  Ambulation Distance (Feet) 10 Feet  Assistive device Rolling walker  Ambulation/Gait Assistance Details max directional verbal cues for sequencing, to increase UE WBing and maintain R quad set when advancing L LE to minimize R knee buckling  Gait Pattern Step-to pattern;Decreased step length - right;Decreased stance time - right;Antalgic  Gait velocity slow  Stairs No  Exercises  Exercises Total Joint (handout provided however little carry over due to lethargy)  Total Joint Exercises  Ankle Circles/Pumps AROM;Both;10 reps;Supine  Quad Sets AROM;Right;10 reps;Supine  Heel  Slides AAROM;Right;5 reps;Supine  Goniometric ROM 30 deg R AA ROM  PT - End of Session  Equipment Utilized During Treatment Gait belt  Activity Tolerance Patient limited by fatigue;Patient limited by pain  Patient left in chair;with call bell/phone within reach  Nurse Communication Mobility status (increased  lethargy from pain medication)  PT Assessment  Clinical Impression Statement Pt s/p R TKA presenting with increased R knee pain, decreased R LE strength and decreased R knee ROM. Patient desires to return home however needs to demonstrate significant improvement over the next 2 days in order to be safe to return home. Patient currently requries increased assist for all mobilty that spouse can not provide. Patient limtied today by nausea, pain, and lethargy. Will con't to work with patient in hopes to achieve safe function for safe d/c home.  PT Recommendation/Assessment Patient needs continued PT services  PT Problem List Decreased strength;Decreased range of motion;Decreased activity tolerance;Decreased knowledge of use of DME  Barriers to Discharge Decreased caregiver support  PT Therapy Diagnosis  Difficulty walking;Abnormality of gait;Generalized weakness;Acute pain  PT Plan  PT Frequency 7X/week  PT Treatment/Interventions DME instruction;Gait training;Stair training;Functional mobility training;Therapeutic activities;Therapeutic exercise  PT Recommendation  Follow Up Recommendations Home health PT;Supervision/Assistance - 24 hour  Equipment Recommended None recommended by PT (pt has recommended DME)  Individuals Consulted  Consulted and Agree with Results and Recommendations Patient  Acute Rehab PT Goals  PT Goal Formulation With patient  Time For Goal Achievement 07/08/11  Potential to Achieve Goals Good  Pt will go Supine/Side to Sit with modified independence;with HOB 0 degrees  PT Goal: Supine/Side to Sit - Progress Goal set today  Pt will go Sit to Supine/Side with modified independence;with HOB 0 degrees  PT Goal: Sit to Supine/Side - Progress Goal set today  Pt will Ambulate 51 - 150 feet;with modified independence;with rolling walker  PT Goal: Ambulate - Progress Goal set today  Pt will Go Up / Down Stairs with min assist;3-5 stairs;with rolling walker (backwards)  PT Goal:  Up/Down Stairs - Progress Goal set today  Pt will Perform Home Exercise Program Independently  PT Goal: Perform Home Exercise Program - Progress Goal set today  Written Expression  Dominant Hand Right    Pain: 7/10 R knee pain  Lewis Shock, PT, DPT Pager #: [phone] Office #: [phone]

## 2011-07-01 NOTE — Progress Notes (Signed)
UR COMPLETED  

## 2011-07-01 NOTE — Care Management Note (Signed)
    Page 1 of 1   07/01/2011     12:13:58 PM   CARE MANAGEMENT NOTE 07/01/2011  Patient:  Katie Carrillo, Katie Carrillo   Account Number:  [id number]  Date Initiated:  07/01/2011  Documentation initiated by:  Anette Guarneri  Subjective/Objective Assessment:   POD#1 s/p right TKA     Action/Plan:   [facility] services arranged by MD office  has RW/3n1 at home  needs CPM   Anticipated DC Date:     Anticipated DC Plan:  HOME W HOME HEALTH SERVICES      DC Planning Services  CM consult      Choice offered to / List presented to:             Status of service:  Completed, signed off Medicare Important Message given?   (If response is "NO", the following Medicare IM given date fields will be blank) Date Medicare IM given:   Date Additional Medicare IM given:    Discharge Disposition:    Per UR Regulation:  Reviewed for med. necessity/level of care/duration of stay  If discussed at Long Length of Stay Meetings, dates discussed:    Comments:  07/01/11  12:09  Anette Guarneri RN/CM spoke with patient and husband regarding d/c planning per patient she has RW and 3n1 at home, needs CPM Dr. Wadie Lessen office has arranged HHPT w/[facility] prior to admission

## 2011-07-01 NOTE — Evaluation (Signed)
Occupational Therapy Evaluation Patient Details Name: Katie Carrillo MRN: [id number] DOB: 11-01-1940 Today's Date: 07/01/2011 Time: 1191-4782 OT Time Calculation (min): 21 min  OT Assessment / Plan / Recommendation Clinical Impression  Pt s/p Rt TKA and presents with generalized weakness, pain, nausea, and overall decreased independence with ADLs. Will benefit from skilled OT in the acute setting to maximize I with ADL and ADL mobility prior to d/c    OT Assessment  Patient needs continued OT Services    Follow Up Recommendations  Home health OT;Supervision/Assistance - 24 hour    Barriers to Discharge      Equipment Recommendations  None recommended by OT    Recommendations for Other Services    Frequency  Min 2X/week    Precautions / Restrictions Precautions Precautions: Knee Restrictions RLE Weight Bearing: Weight bearing as tolerated   Pertinent Vitals/Pain Pt c/o "slight" Rt knee pain but did not rate. Pre-medicated. Pt with Nausea and dry heaves at end of session- fanned pt and nausea passed.     ADL  Grooming: Performed;Wash/dry face;Teeth care;Minimal assistance Where Assessed - Grooming: Supported standing Lower Body Bathing: Simulated;+1 Total assistance Where Assessed - Lower Body Bathing: Supported sit to stand Lower Body Dressing: Simulated;Maximal assistance Where Assessed - Lower Body Dressing: Supported sitting Toilet Transfer: Simulated;Moderate assistance Toilet Transfer Method: Sit to Barista:  (from chair with armrests; pt very lethargic) Toileting - Clothing Manipulation and Hygiene: Simulated;Moderate assistance Where Assessed - Toileting Clothing Manipulation and Hygiene: Standing Equipment Used: Gait belt;Knee Immobilizer Transfers/Ambulation Related to ADLs: Did not attempt secondary to pt lethargy ADL Comments: Limited eval secondary to pt lethargy    OT Diagnosis: Generalized weakness;Acute pain  OT Problem List:  Decreased range of motion;Decreased activity tolerance;Impaired balance (sitting and/or standing);Decreased knowledge of use of DME or AE;Decreased knowledge of precautions;Pain;Increased edema OT Treatment Interventions: Self-care/ADL training;DME and/or AE instruction;Therapeutic activities;Patient/family education;Balance training   OT Goals Acute Rehab OT Goals OT Goal Formulation: With patient Time For Goal Achievement: 07/08/11 Potential to Achieve Goals: Good ADL Goals Pt Will Perform Grooming: with modified independence;Standing at sink ADL Goal: Grooming - Progress: Goal set today Pt Will Perform Lower Body Bathing: with set-up;with supervision;Sit to stand from chair;Sit to stand from bed;Sit to stand in shower ADL Goal: Lower Body Bathing - Progress: Goal set today Pt Will Perform Lower Body Dressing: with min assist;Sit to stand from chair;Sit to stand from bed ADL Goal: Lower Body Dressing - Progress: Goal set today Pt Will Transfer to Toilet: with modified independence;Ambulation;with DME ADL Goal: Toilet Transfer - Progress: Goal set today Pt Will Perform Toileting - Clothing Manipulation: with modified independence;Standing ADL Goal: Toileting - Clothing Manipulation - Progress: Goal set today Pt Will Perform Tub/Shower Transfer: Shower transfer;with min assist;Ambulation;with DME;Shower seat with back ADL Goal: Tub/Shower Transfer - Progress: Goal set today  Visit Information  Last OT Received On: 07/01/11 Assistance Needed: +2    Subjective Data  Subjective: Oh I'm feeling dizzy Patient Stated Goal: Return home   Prior Functioning  Home Living Lives With: Spouse Available Help at Discharge: Family;Available PRN/intermittently Type of Home: House Home Access: Stairs to enter [employer] of Steps: 5 Entrance Stairs-Rails: Right Home Layout: One level Bathroom Shower/Tub: Tub/shower unit;Walk-in shower;Curtain (door on walk-in shower) Bathroom Toilet:  Standard Bathroom Accessibility: Yes How Accessible: Accessible via walker Home Adaptive Equipment: Bedside commode/3-in-1;Walker - rolling;Shower chair with back Prior Function Level of Independence: Independent Able to Take Stairs?: Yes Driving: Yes Vocation: Retired Special educational needs teacher  Communication: No difficulties Dominant Hand: Right    Cognition  Arousal/Alertness: Lethargic Orientation Level: Oriented X4 / Intact Behavior During Session: Lethargic    Extremity/Trunk Assessment Right Upper Extremity Assessment RUE ROM/Strength/Tone: Within functional levels Left Upper Extremity Assessment LUE ROM/Strength/Tone: Within functional levels   Mobility Bed Mobility Bed Mobility: Sit to Supine Supine to Sit: 2: Max assist;HOB flat;With rails Details for Bed Mobility Assistance: assist for BLE and to control trunk down Transfers Sit to Stand: 3: Mod assist;With upper extremity assist;From chair/3-in-1;With armrests Stand to Sit: 3: Mod assist;To chair/3-in-1;To bed Details for Transfer Assistance: Initial VC for hand placement; pt required consistent VC for kicking RLE out during stand to sit   Exercise    Balance    End of Session OT - End of Session Equipment Utilized During Treatment: Gait belt;Right knee immobilizer Activity Tolerance: Patient limited by fatigue (and nausea) Patient left: in bed;with call bell/phone within reach Nurse Communication: Mobility status   Zaine Elsass 07/01/2011, 12:26 PM

## 2011-07-01 NOTE — Progress Notes (Signed)
Referral received for SNF. Chart reviewed and CSW has spoken with RNCM who indicates that patient is for DC to home with Home Health and DME.  CSW to sign off. Please re-consult if CSW needs arise.  Muaad Boehning T. Naydeline Morace, MSW  [phone]  

## 2011-07-02 DIAGNOSIS — M1711 Unilateral primary osteoarthritis, right knee: Secondary | ICD-10-CM | POA: Diagnosis present

## 2011-07-02 LAB — CBC
HCT: 28.3 % — ABNORMAL LOW (ref 36.0–46.0)
Hemoglobin: 9.3 g/dL — ABNORMAL LOW (ref 12.0–15.0)
MCH: 29.2 pg (ref 26.0–34.0)
MCHC: 32.9 g/dL (ref 30.0–36.0)
MCV: 89 fL (ref 78.0–100.0)
RDW: 15.5 % (ref 11.5–15.5)

## 2011-07-02 MED ORDER — ASPIRIN 325 MG PO TABS: 325.0000 mg | ORAL_TABLET | Freq: Two times a day (BID) | ORAL | Status: DC

## 2011-07-02 MED ORDER — OXYCODONE-ACETAMINOPHEN 5-325 MG PO TABS: 1.0000 | ORAL_TABLET | ORAL | Status: AC | PRN

## 2011-07-02 MED ORDER — METHOCARBAMOL 500 MG PO TABS: 500.0000 mg | ORAL_TABLET | Freq: Four times a day (QID) | ORAL | Status: AC | PRN

## 2011-07-02 NOTE — Progress Notes (Signed)
Referral received for SNF. Chart reviewed and CSW has spoken with RNCM who indicates that patient is for DC to home with Home Health and DME.  CSW to sign off. Please re-consult if CSW needs arise.  Conway Fedora T. Alontae Chaloux, MSW  [phone]  

## 2011-07-02 NOTE — Discharge Summary (Signed)
Patient ID: Katie Carrillo MRN: [id number] DOB/AGE: February 09, 1940 71 y.o.  Admit date: 06/30/2011 Discharge date: 07/04/11  Admission Diagnoses:  Active Problems:  Osteoarthritis of right knee   Discharge Diagnoses:  Same  Past Medical History  Diagnosis Date  . Cancer     breast      lumpectomy   lt  . DEMENTIA   . Urinary urgency   . Hypertension     dr Madelin Rear    [employer]    Surgeries: Procedure(s): TOTAL KNEE ARTHROPLASTY on 06/30/2011   Consultants:    Discharged Condition: Improved  Hospital Course: Katie Carrillo is an 71 y.o. female who was admitted 06/30/2011 for operative treatment of<principal problem not specified>. Patient has severe unremitting pain that affects sleep, daily activities, and work/hobbies. After pre-op clearance the patient was taken to the operating room on 06/30/2011 and underwent  Procedure(s): TOTAL KNEE ARTHROPLASTY.    Patient was given perioperative antibiotics: Anti-infectives     Start     Dose/Rate Route Frequency Ordered Stop   06/30/11 1325   cefUROXime (ZINACEF) injection  Status:  Discontinued          As needed 06/30/11 1326 06/30/11 1436   06/29/11 1423   ceFAZolin (ANCEF) IVPB 2 g/50 mL premix  Status:  Discontinued        2 g 100 mL/hr over 30 Minutes Intravenous 60 min pre-op 06/29/11 1423 06/30/11 1656           Patient was given sequential compression devices, early ambulation, and chemoprophylaxis to prevent DVT.  Patient benefited maximally from hospital stay and there were no complications.    Recent vital signs: Patient Vitals for the past 24 hrs:  BP Temp Pulse Resp SpO2  07/02/11 0611 153/75 mmHg 98.2 F (36.8 C) 102  20  91 %  July 20, 2011 07/20/34 124/64 mmHg 99.5 F (37.5 C) 94  18  94 %  07-20-11 1408 138/72 mmHg 97.8 F (36.6 C) 73  16  98 %     Recent laboratory studies:  Basename 07/02/11 0558 2011-07-20 0622  WBC 10.6* 10.5  HGB 9.3* 9.9*  HCT 28.3* 30.8*  PLT 161 187  NA -- 137  K -- 3.5  CL  -- 98  CO2 -- 29  BUN -- 12  CREATININE -- 0.72  GLUCOSE -- 128*  INR -- --  CALCIUM -- 8.5     Discharge Medications:   Medication List  As of 07/02/2011  7:27 AM   TAKE these medications         aspirin 325 MG tablet   Take 1 tablet (325 mg total) by mouth 2 (two) times daily with a meal.      donepezil 10 MG tablet   Commonly known as: ARICEPT   Take 10 mg by mouth daily.      fish oil-omega-3 fatty acids 1000 MG capsule   Take 1 g by mouth 2 (two) times daily.      lisinopril-hydrochlorothiazide 10-12.5 MG per tablet   Commonly known as: PRINZIDE,ZESTORETIC   Take 1 tablet by mouth daily.      methocarbamol 500 MG tablet   Commonly known as: ROBAXIN   Take 1 tablet (500 mg total) by mouth every 6 (six) hours as needed.      mulitivitamin with minerals Tabs   Take 1 tablet by mouth daily.      oxyCODONE-acetaminophen 5-325 MG per tablet   Commonly known as: PERCOCET   Take 1 tablet  by mouth every 4 (four) hours as needed for pain.      PERIDIN-C PO   Take 1 tablet by mouth daily.      Red Yeast Rice 600 MG Caps   Take 2 capsules by mouth every evening.            Diagnostic Studies: Dg Chest 2 View  06/18/2011  *RADIOLOGY REPORT*  Clinical Data: 71 year old female preoperative study for knee replacement.  Hypertension.  CHEST - 2 VIEW  Comparison: 02/17/2006.  Findings: Mild cardiomegaly is stable or slightly increased. Other mediastinal contours are within normal limits.  Visualized tracheal air column is within normal limits.  No pneumothorax, pulmonary edema, pleural effusion or confluent pulmonary opacity. No acute osseous abnormality identified.  IMPRESSION: Mild cardiomegaly. No acute cardiopulmonary abnormality.  Original Report Authenticated By: Harley Hallmark, M.D.    Disposition:   Discharge Orders    Future Orders Please Complete By Expires   Increase activity slowly      Walker       May shower / Bathe      Driving Restrictions       Comments:   No driving for 2 weeks.   Change dressing (specify)      Comments:   Dressing change as needed.   Call MD for:  temperature >100.4      Call MD for:  severe uncontrolled pain      Call MD for:  redness, tenderness, or signs of infection (pain, swelling, redness, odor or green/yellow discharge around incision site)      Discharge instructions      Comments:   F/U with Dr. Turner Daniels in 10 days (or as scheduled).         SignedHazle Nordmann. 07/02/2011, 7:27 AM

## 2011-07-02 NOTE — Progress Notes (Signed)
PATIENT ID: Katie Carrillo  MRN: [id number]  DOB/AGE:  [birth year] / 71 y.o.  2 Days Post-Op Procedure(s) (LRB): TOTAL KNEE ARTHROPLASTY (Right)    PROGRESS NOTE Subjective: Patient is alert, oriented, no Nausea, no Vomiting, yes passing gas, no Bowel Movement. Taking PO well. Denies SOB, Chest or Calf Pain. Using Incentive Spirometer, PAS in place. Ambulating slowly with PT. Patient reports pain as moderate  .    Objective: Vital signs in last 24 hours: Filed Vitals:   07/01/11 0544 07/01/11 1408 07/01/11 2136 07/02/11 0611  BP: 139/70 138/72 124/64 153/75  Pulse: 73 73 94 102  Temp: 99.3 F (37.4 C) 97.8 F (36.6 C) 99.5 F (37.5 C) 98.2 F (36.8 C)  TempSrc: Oral     Resp: 18 16 18 20   SpO2: 97% 98% 94% 91%      Intake/Output from previous day: I/O last 3 completed shifts: In: 240 [P.O.:240] Out: 1075 [Urine:950; Drains:125]   Intake/Output this shift:     LABORATORY DATA:  Basename 07/02/11 0558 07/01/11 0622  WBC 10.6* 10.5  HGB 9.3* 9.9*  HCT 28.3* 30.8*  PLT 161 187  NA -- 137  K -- 3.5  CL -- 98  CO2 -- 29  BUN -- 12  CREATININE -- 0.72  GLUCOSE -- 128*  GLUCAP -- --  INR -- --  CALCIUM -- 8.5    Examination: Neurologically intact ABD soft Neurovascular intact Sensation intact distally Intact pulses distally Dorsiflexion/Plantar flexion intact Incision: dressing C/D/I}  Assessment:   2 Days Post-Op Procedure(s) (LRB): TOTAL KNEE ARTHROPLASTY (Right) ADDITIONAL DIAGNOSIS:  none  Plan: PT/OT WBAT, CPM 5/hrs day until ROM 0-90 degrees, then D/C CPM DVT Prophylaxis:  SCDx72hrs, ASA 325 mg BID x 2 weeks DISCHARGE PLAN: Home Saturday or Sunday DISCHARGE NEEDS: HHPT, HHRN, CPM, Walker and 3-in-1 comode seat     Katie Carrillo. 07/02/2011, 7:23 AM

## 2011-07-02 NOTE — Progress Notes (Signed)
Physical Therapy Treatment Note   07/02/11 0837  PT Visit Information  Last PT Received On 07/02/11  Assistance Needed +1  PT Time Calculation  PT Start Time 0837  PT Stop Time 0906  PT Time Calculation (min) 29 min  Subjective Data  Subjective Pt received supine in bed agreeable to PT. Pt with R LE resting in 45 degree flexion at knee and R LE in external rotation.  Precautions  Precautions Knee  Restrictions  RLE Weight Bearing WBAT  Cognition  Overall Cognitive Status Appears within functional limits for tasks assessed/performed  Arousal/Alertness Awake/alert  Orientation Level Oriented X4 / Intact  Behavior During Session [facility] for tasks performed  Bed Mobility  Bed Mobility Supine to Sit  Supine to Sit 4: Min assist;HOB flat  Details for Bed Mobility Assistance max directional verbal cues, minA for R LE initially to get to EOB and trunk elevation due to pain in R hand from IV. Pt requires significant increase in time  Transfers  Transfers Sit to Stand;Stand to Sit  Sit to Stand 3: Mod assist;With upper extremity assist;From chair/3-in-1;With armrests  Stand to Sit 3: Mod assist;To chair/3-in-1;To bed  Details for Transfer Assistance v/c's for hand placement, assist to achieve full upright position due to excessive bilat knee flexion and minimal WBing thru UEs despite v/c's  Ambulation/Gait  Ambulation/Gait Assistance 4: Min assist  Ambulation Distance (Feet) 75 Feet  Assistive device Rolling walker  Ambulation/Gait Assistance Details pt con't to be able to minimal WB on R LE and is unable to maintain R foot flat or achieve full quad set on R when advancing L LE  Gait Pattern Step-to pattern;Decreased step length - right;Decreased stance time - right;Antalgic  Gait velocity slow  Stairs No  Exercises  Exercises Total Joint  Total Joint Exercises  Ankle Circles/Pumps AROM;Both;10 reps;Supine  Quad Sets AROM;Right;10 reps;Supine  Heel Slides AAROM;Right;Supine;10 reps  PT  - End of Session  Equipment Utilized During Treatment Gait belt  Activity Tolerance Patient limited by fatigue;Patient limited by pain  Patient left in chair;with call bell/phone within reach;with family/visitor present  Nurse Communication Mobility status  PT - Assessment/Plan  Comments on Treatment Session Pt con't to have signficiant difficulty with achieving R quad set and rests R LE in excessive flexion. Pt max encouragement to tolerate small towel roll beneath R heel however pt with increased pain. Pt with slow progression towards all goals.   PT Plan Discharge plan remains appropriate;Frequency remains appropriate  PT Frequency 7X/week  Follow Up Recommendations Home health PT;Supervision/Assistance - 24 hour  Equipment Recommended None recommended by PT  Acute Rehab PT Goals  Time For Goal Achievement 07/08/11  PT Goal: Supine/Side to Sit - Progress Progressing toward goal  PT Goal: Ambulate - Progress Progressing toward goal  PT Goal: Up/Down Stairs - Progress Progressing toward goal  PT Goal: Perform Home Exercise Program - Progress Progressing toward goal    Pain: 7/10 R knee pain. RN notified of patient request for pain meds.  Lewis Shock, PT, DPT Pager #: [phone] Office #: [phone]

## 2011-07-02 NOTE — Progress Notes (Signed)
Physical Therapy Treatment Note   07/02/11 1415  PT Visit Information  Last PT Received On 07/02/11  Assistance Needed +1  PT Time Calculation  PT Start Time 1415  PT Stop Time 1501  PT Time Calculation (min) 46 min  Subjective Data  Subjective Pt received supine in bed in CPM 0-30 with R LE external rotation and increased knee flexion.  Precautions  Precautions Knee  Restrictions  RLE Weight Bearing WBAT  Cognition  Overall Cognitive Status Appears within functional limits for tasks assessed/performed  Arousal/Alertness Awake/alert  Orientation Level Oriented X4 / Intact  Bed Mobility  Bed Mobility Supine to Sit  Supine to Sit 3: Mod assist  Details for Bed Mobility Assistance modA for trunk elevation, pt I with LE management  Transfers  Transfers Sit to Stand;Stand to Sit;Stand Pivot Transfers  Sit to Stand 3: Mod assist;From bed;With upper extremity assist  Stand to Sit 4: Min guard;With upper extremity assist;To chair/3-in-1;To bed  Stand Pivot Transfers 4: Min assist  Details for Transfer Assistance max directional v/c's for sequening, pt unable to achieve full quad set, R knee in approx 30 deg flex and WBing on toes during std pvt xfer to chair with RW  Exercises  Exercises Total Joint (significant time spent on passive knee extension in supine)  Total Joint Exercises  Ankle Circles/Pumps AROM;Both;10 reps;Supine  Quad Sets AROM;Right;20 reps;Supine (minimal contraction, uses gluteal mm to compensate)  Heel Slides AAROM;Right;Supine;10 reps  Goniometric ROM 55 degrees AA in sitting, 50 degrees AA in supine  Long Arc Quad AROM;Right;20 reps;Seated  Other Exercises  Other Exercises passive knee extension stretch in supine x 5 reps of 60 sec  PT - End of Session  Equipment Utilized During Treatment Gait belt  Activity Tolerance Patient limited by fatigue;Patient limited by pain  Patient left in chair;with call bell/phone within reach;with family/visitor present  Nurse  Communication Mobility status  PT - Assessment/Plan  Comments on Treatment Session Pt con't to have minimal R quad set and very low tolerance of R Knee extension. patient rests R LE in ER with approx 30 deg of flexion despite max education and verbal cues to keep R LE straight. Patient making extremely slow progress. Will trial steps tomorrow to prep for anticipated d/c on Sunday.  max encouargement to pt to complete HEP provided. Family present and providing encouragement as well to advance R knee active extension.  PT Plan Discharge plan remains appropriate;Frequency remains appropriate  PT Frequency 7X/week  Follow Up Recommendations Home health PT;Supervision/Assistance - 24 hour  Equipment Recommended None recommended by PT  Acute Rehab PT Goals  Time For Goal Achievement 07/08/11  PT Goal: Supine/Side to Sit - Progress Progressing toward goal  PT Goal: Ambulate - Progress Progressing toward goal  PT Goal: Perform Home Exercise Program - Progress Progressing toward goal     Pain: pt c/o pain with R knee ROM and sitting up in chair with LEs in extension.   Lewis Shock, PT, DPT Pager #: [phone] Office #: [phone]

## 2011-07-02 NOTE — Progress Notes (Signed)
Occupational Therapy Treatment Patient Details Name: Katie Carrillo MRN: [id number] DOB: 08-Oct-1940 Today's Date: 07/02/2011 Time: 4098-1191 OT Time Calculation (min): 25 min  OT Assessment / Plan / Recommendation Comments on Treatment Session Pt. making slow steady progress.  Husband present and providing instruction to pt.       Follow Up Recommendations  Home health OT;Supervision/Assistance - 24 hour    Barriers to Discharge       Equipment Recommendations  None recommended by OT    Recommendations for Other Services    Frequency Min 2X/week   Plan Discharge plan remains appropriate    Precautions / Restrictions Precautions Precautions: Knee Restrictions RLE Weight Bearing: Weight bearing as tolerated   Pertinent Vitals/Pain     ADL  Lower Body Dressing: Performed;Maximal assistance (socks) Where Assessed - Lower Body Dressing: Unsupported sitting Toilet Transfer: Performed;Minimal assistance Toilet Transfer Method: Sit to stand Toilet Transfer Equipment: Raised toilet seat with arms (or 3-in-1 over toilet) Toileting - Clothing Manipulation and Hygiene: Performed;Minimal assistance Where Assessed - Toileting Clothing Manipulation and Hygiene: Standing Transfers/Ambulation Related to ADLs: ambulated to BR with min guard assist, and mod cues to place heel on floor, and to increase stride length.   ADL Comments: Pt. requires cues for walker sequence, and for bed mobility.  Pt is very slow to process information    OT Diagnosis:    OT Problem List:   OT Treatment Interventions:     OT Goals ADL Goals ADL Goal: Lower Body Dressing - Progress: Progressing toward goals ADL Goal: Toilet Transfer - Progress: Progressing toward goals ADL Goal: Toileting - Clothing Manipulation - Progress: Progressing toward goals  Visit Information  Last OT Received On: 07/02/11 Assistance Needed: +1    Subjective Data      Prior Functioning       Cognition  Overall Cognitive  Status: Appears within functional limits for tasks assessed/performed Arousal/Alertness: Awake/alert Orientation Level: Oriented X4 / Intact Behavior During Session: [facility] for tasks performed Cognition - Other Comments: Pt. slow to process information.      Mobility Bed Mobility Bed Mobility: Supine to Sit Supine to Sit: 3: Mod assist;HOB flat Sit to Supine: 4: Min assist;HOB flat (assist for Rt. LE) Details for Bed Mobility Assistance: requires max cues for technique, assist for Rt. LE and assist to lift shoulders onto bed Transfers Transfers: Sit to Stand;Stand to Sit Sit to Stand: 3: Mod assist;From bed;With upper extremity assist Stand to Sit: 4: Min guard;With upper extremity assist;To chair/3-in-1;To bed Details for Transfer Assistance: Pt. initially required mod A to move sit to stand from bed.  Min guard assist from 3-in-1    Exercises    Balance    End of Session OT - End of Session Activity Tolerance: Patient limited by fatigue Patient left: in bed;with call bell/phone within reach;with family/visitor present   [place]ville Whitefield, Ursula Alert M 07/02/2011, 1:47 PM

## 2011-07-03 LAB — CBC
MCH: 28.5 pg (ref 26.0–34.0)
MCHC: 32 g/dL (ref 30.0–36.0)
MCV: 89 fL (ref 78.0–100.0)
Platelets: 169 10*3/uL (ref 150–400)

## 2011-07-03 MED ORDER — ASPIRIN 81 MG PO CHEW
CHEWABLE_TABLET | ORAL | Status: AC
  Administered 2011-07-03: 81 mg
  Filled 2011-07-03: qty 1

## 2011-07-03 MED ORDER — BISACODYL 10 MG RE SUPP
10.0000 mg | Freq: Every day | RECTAL | Status: DC | PRN
  Filled 2011-07-03: qty 1

## 2011-07-03 NOTE — Progress Notes (Signed)
07/03/11 1500  Exercises  Exercises Total Joint  Total Joint Exercises  Ankle Circles/Pumps AROM;Both;10 reps;Supine  Quad Sets AROM;Right;10 reps;Supine;Strengthening  Heel Slides AAROM;Strengthening;10 reps;Supine;Right  Hip ABduction/ADduction AROM;Strengthening;Right;10 reps;Supine  Straight Leg Raises AAROM;Strengthening;Right;10 reps;Supine  PT - End of Session  Activity Tolerance Patient limited by fatigue;Patient tolerated treatment well  Patient left in bed;with call bell/phone within reach  PT - Assessment/Plan  Comments on Treatment Session Agreeable to ther ex only due to increased fatique from not sleeping last night.  PT Plan Discharge plan remains appropriate;Frequency remains appropriate  PT Frequency 7X/week  Follow Up Recommendations Home health PT;Supervision/Assistance - 24 hour  Equipment Recommended None recommended by PT  Acute Rehab PT Goals  PT Goal: Perform Home Exercise Program - Progress Progressing toward goal    Sallyanne Kuster, PTA Office- [phone]

## 2011-07-03 NOTE — Progress Notes (Signed)
Subjective: 3 Days Post-Op Procedure(s) (LRB): TOTAL KNEE ARTHROPLASTY (Right)  Activity level:  oob Diet tolerance:  ok Voiding:  ok Patient reports pain as 3 on 0-10 scale.    Objective: Vital signs in last 24 hours: Temp:  [98 F (36.7 C)-99.6 F (37.6 C)] 98.9 F (37.2 C) (06/01 0532) Pulse Rate:  [83-95] 95  (06/01 0532) Resp:  [16-20] 18  (06/01 0532) BP: (114-123)/(62-75) 123/75 mmHg (06/01 0532) SpO2:  [91 %-94 %] 91 % (06/01 0532)  Labs:  Basename 07/03/11 0615 07/02/11 0558 07/01/11 0622  HGB 8.0* 9.3* 9.9*    Basename 07/03/11 0615 07/02/11 0558  WBC 9.8 10.6*  RBC 2.81* 3.18*  HCT 25.0* 28.3*  PLT 169 161    Basename 07/01/11 0622  NA 137  K 3.5  CL 98  CO2 29  BUN 12  CREATININE 0.72  GLUCOSE 128*  CALCIUM 8.5   No results found for this basename: LABPT:2,INR:2 in the last 72 hours  Physical Exam:  Neurologically intact ABD soft Neurovascular intact Sensation intact distally Intact pulses distally Dorsiflexion/Plantar flexion intact No cellulitis present Compartment soft  Assessment/Plan:  3 Days Post-Op Procedure(s) (LRB): TOTAL KNEE ARTHROPLASTY (Right) Advance diet Up with therapy D/C IV fluids Plan for discharge tomorrow    Tivis Wherry R 07/03/2011, 8:47 AM

## 2011-07-03 NOTE — Progress Notes (Signed)
Physical Therapy Treatment Patient Details Name: Katie Carrillo MRN: [id number] DOB: 1940-05-14 Today's Date: 07/03/2011 Time: 4696-2952 PT Time Calculation (min): 36 min  PT Assessment / Plan / Recommendation Comments on Treatment Session  Great progress today with mobility and knee motion. Pt with 15 degree ext lag in resting neurtral position and able to achieve 5 degree ext lag with quad sets.    Follow Up Recommendations  Home health PT;Supervision/Assistance - 24 hour       Equipment Recommendations  None recommended by PT       Frequency 7X/week   Plan Discharge plan remains appropriate;Frequency remains appropriate    Precautions / Restrictions Precautions Precautions: Knee Restrictions RLE Weight Bearing: Weight bearing as tolerated       Mobility  Transfers Sit to Stand: 4: Min guard;From chair/3-in-1;With armrests;With upper extremity assist Stand to Sit: 5: Supervision;To chair/3-in-1;With armrests;With upper extremity assist Ambulation/Gait Ambulation/Gait Assistance: 5: Supervision Ambulation Distance (Feet): 180 Feet Assistive device: Rolling walker Ambulation/Gait Assistance Details: Antalgic gait with pt progressing from step to pattern to a reciprocal pattern. Pt able to achieve heel strike into flat foot with right LE today.  Gait Pattern: Step-to pattern;Step-through pattern;Decreased stance time - right;Decreased step length - left;Antalgic Gait velocity: Steady cadence without loss of balance Stairs: Yes Stairs Assistance: 4: Min guard Stairs Assistance Details (indicate cue type and reason): cues for sequency and technique with stairs (up with strong leg first, down with weak leg first). Stair Management Technique: One rail Right;Sideways;Step to pattern (pt has a rail and prefers to use it vs walker as in goal) Number of Stairs: 3     Exercises Total Joint Exercises Ankle Circles/Pumps: AROM;Both;10 reps;Supine Quad Sets:  AROM;Strengthening;Right;10 reps;Supine Heel Slides: AAROM;Strengthening;Right;10 reps;Supine Straight Leg Raises: AAROM;Strengthening;Right;10 reps;Supine Goniometric ROM: 70 degrees AA in reclined position.     PT Goals Acute Rehab PT Goals PT Goal: Ambulate - Progress: Progressing toward goal PT Goal: Up/Down Stairs - Progress: Partly met (pt prefers to use her rail, not walker.) PT Goal: Perform Home Exercise Program - Progress: Progressing toward goal  Visit Information  Last PT Received On: 07/03/11    Subjective Data  Subjective: "I am ready to do stairs today". No new complaints.   Cognition  Overall Cognitive Status: Appears within functional limits for tasks assessed/performed Arousal/Alertness: Awake/alert Orientation Level: Appears intact for tasks assessed Behavior During Session: [facility] for tasks performed    Balance     End of Session PT - End of Session Equipment Utilized During Treatment: Gait belt Activity Tolerance: Patient tolerated treatment well Patient left: in chair;with call bell/phone within reach;with family/visitor present Nurse Communication: Mobility status    Sallyanne Kuster 07/03/2011, 12:54 PM  Sallyanne Kuster, PTA Office- [phone]

## 2011-07-04 NOTE — Progress Notes (Signed)
Physical Therapy Treatment Patient Details Name: Katie Carrillo MRN: [id number] DOB: May 23, 1940 Today's Date: 07/04/2011 Time: 4098-1191 PT Time Calculation (min): 25 min  PT Assessment / Plan / Recommendation Comments on Treatment Session  Good tolerance of activity today with less assistance needed with mobility and exercises.    Follow Up Recommendations  Home health PT;Supervision/Assistance - 24 hour       Equipment Recommendations  None recommended by PT       Frequency 7X/week   Plan Discharge plan remains appropriate;Frequency remains appropriate    Precautions / Restrictions Precautions Precautions: Knee Restrictions RLE Weight Bearing: Weight bearing as tolerated       Mobility  Transfers Sit to Stand: 6: Modified independent (Device/Increase time);With armrests;With upper extremity assist;From chair/3-in-1 Stand to Sit: 6: Modified independent (Device/Increase time);With armrests;With upper extremity assist;To chair/3-in-1 Details for Transfer Assistance: no cues or assistance required with transfers. Ambulation/Gait Ambulation/Gait Assistance: 4: Min guard Ambulation Distance (Feet): 180 Feet Assistive device: Rolling walker;Other (Comment) Ambulation/Gait Assistance Details: with mod cues pt was able to progress from short, shuffled gait pattern to increased step lenght with good foot clearance and heel-toe progression for approx 3-4 steps before reverting back to short shuffled gait. cues to decrease UE reliance on walker and place increased weight through right LE in stance to assist with a more fluid gait pattern.               Gait Pattern: Step-through pattern;Decreased stride length;Decreased step length - right;Decreased step length - left;Right foot flat;Antalgic    Exercises Total Joint Exercises Ankle Circles/Pumps: AROM;Both;10 reps;Supine Quad Sets: AROM;Strengthening;Right;10 reps;Supine Heel Slides: AAROM;Strengthening;Right;10 reps;Supine Hip  ABduction/ADduction: AAROM;Strengthening;Right;10 reps;Supine Straight Leg Raises: AAROM;Strengthening;Right;10 reps;Supine    PT Goals Acute Rehab PT Goals PT Goal: Ambulate - Progress: Met PT Goal: Perform Home Exercise Program - Progress: Progressing toward goal  Visit Information  Last PT Received On: 07/04/11 Assistance Needed: +1    Subjective Data  Subjective: No new complaints, ready to go home today. Agreeable to therapy.   Cognition  Overall Cognitive Status: Appears within functional limits for tasks assessed/performed Arousal/Alertness: Awake/alert Orientation Level: Appears intact for tasks assessed Behavior During Session: [facility] for tasks performed       End of Session PT - End of Session Equipment Utilized During Treatment: Gait belt Activity Tolerance: Patient tolerated treatment well Patient left: in chair;with call bell/phone within reach;with family/visitor present    Sallyanne Kuster 07/04/2011, 3:03 PM  Sallyanne Kuster, PTA Office- [phone]

## 2011-07-05 DIAGNOSIS — Z96659 Presence of unspecified artificial knee joint: Secondary | ICD-10-CM | POA: Diagnosis not present

## 2011-07-05 DIAGNOSIS — Z471 Aftercare following joint replacement surgery: Secondary | ICD-10-CM | POA: Diagnosis not present

## 2011-07-05 DIAGNOSIS — I10 Essential (primary) hypertension: Secondary | ICD-10-CM | POA: Diagnosis not present

## 2011-07-05 DIAGNOSIS — R269 Unspecified abnormalities of gait and mobility: Secondary | ICD-10-CM | POA: Diagnosis not present

## 2011-07-06 DIAGNOSIS — Z471 Aftercare following joint replacement surgery: Secondary | ICD-10-CM | POA: Diagnosis not present

## 2011-07-06 DIAGNOSIS — I10 Essential (primary) hypertension: Secondary | ICD-10-CM | POA: Diagnosis not present

## 2011-07-06 DIAGNOSIS — R269 Unspecified abnormalities of gait and mobility: Secondary | ICD-10-CM | POA: Diagnosis not present

## 2011-07-06 DIAGNOSIS — Z96659 Presence of unspecified artificial knee joint: Secondary | ICD-10-CM | POA: Diagnosis not present

## 2011-07-07 DIAGNOSIS — Z471 Aftercare following joint replacement surgery: Secondary | ICD-10-CM | POA: Diagnosis not present

## 2011-07-07 DIAGNOSIS — Z96659 Presence of unspecified artificial knee joint: Secondary | ICD-10-CM | POA: Diagnosis not present

## 2011-07-07 DIAGNOSIS — R269 Unspecified abnormalities of gait and mobility: Secondary | ICD-10-CM | POA: Diagnosis not present

## 2011-07-07 DIAGNOSIS — I10 Essential (primary) hypertension: Secondary | ICD-10-CM | POA: Diagnosis not present

## 2011-07-08 DIAGNOSIS — Z471 Aftercare following joint replacement surgery: Secondary | ICD-10-CM | POA: Diagnosis not present

## 2011-07-08 DIAGNOSIS — I10 Essential (primary) hypertension: Secondary | ICD-10-CM | POA: Diagnosis not present

## 2011-07-08 DIAGNOSIS — Z96659 Presence of unspecified artificial knee joint: Secondary | ICD-10-CM | POA: Diagnosis not present

## 2011-07-08 DIAGNOSIS — R269 Unspecified abnormalities of gait and mobility: Secondary | ICD-10-CM | POA: Diagnosis not present

## 2011-07-09 DIAGNOSIS — I10 Essential (primary) hypertension: Secondary | ICD-10-CM | POA: Diagnosis not present

## 2011-07-09 DIAGNOSIS — Z471 Aftercare following joint replacement surgery: Secondary | ICD-10-CM | POA: Diagnosis not present

## 2011-07-09 DIAGNOSIS — R269 Unspecified abnormalities of gait and mobility: Secondary | ICD-10-CM | POA: Diagnosis not present

## 2011-07-09 DIAGNOSIS — Z96659 Presence of unspecified artificial knee joint: Secondary | ICD-10-CM | POA: Diagnosis not present

## 2011-07-10 DIAGNOSIS — R269 Unspecified abnormalities of gait and mobility: Secondary | ICD-10-CM | POA: Diagnosis not present

## 2011-07-10 DIAGNOSIS — I10 Essential (primary) hypertension: Secondary | ICD-10-CM | POA: Diagnosis not present

## 2011-07-10 DIAGNOSIS — Z471 Aftercare following joint replacement surgery: Secondary | ICD-10-CM | POA: Diagnosis not present

## 2011-07-10 DIAGNOSIS — Z96659 Presence of unspecified artificial knee joint: Secondary | ICD-10-CM | POA: Diagnosis not present

## 2011-07-12 DIAGNOSIS — R269 Unspecified abnormalities of gait and mobility: Secondary | ICD-10-CM | POA: Diagnosis not present

## 2011-07-12 DIAGNOSIS — I10 Essential (primary) hypertension: Secondary | ICD-10-CM | POA: Diagnosis not present

## 2011-07-12 DIAGNOSIS — Z471 Aftercare following joint replacement surgery: Secondary | ICD-10-CM | POA: Diagnosis not present

## 2011-07-12 DIAGNOSIS — Z96659 Presence of unspecified artificial knee joint: Secondary | ICD-10-CM | POA: Diagnosis not present

## 2011-07-13 DIAGNOSIS — M1710 Unilateral primary osteoarthritis, unspecified knee: Secondary | ICD-10-CM | POA: Diagnosis not present

## 2011-07-14 DIAGNOSIS — Z96659 Presence of unspecified artificial knee joint: Secondary | ICD-10-CM | POA: Diagnosis not present

## 2011-07-14 DIAGNOSIS — R269 Unspecified abnormalities of gait and mobility: Secondary | ICD-10-CM | POA: Diagnosis not present

## 2011-07-14 DIAGNOSIS — I10 Essential (primary) hypertension: Secondary | ICD-10-CM | POA: Diagnosis not present

## 2011-07-14 DIAGNOSIS — Z471 Aftercare following joint replacement surgery: Secondary | ICD-10-CM | POA: Diagnosis not present

## 2011-07-15 DIAGNOSIS — Z471 Aftercare following joint replacement surgery: Secondary | ICD-10-CM | POA: Diagnosis not present

## 2011-07-15 DIAGNOSIS — R269 Unspecified abnormalities of gait and mobility: Secondary | ICD-10-CM | POA: Diagnosis not present

## 2011-07-15 DIAGNOSIS — I10 Essential (primary) hypertension: Secondary | ICD-10-CM | POA: Diagnosis not present

## 2011-07-15 DIAGNOSIS — Z96659 Presence of unspecified artificial knee joint: Secondary | ICD-10-CM | POA: Diagnosis not present

## 2011-07-16 DIAGNOSIS — Z96659 Presence of unspecified artificial knee joint: Secondary | ICD-10-CM | POA: Diagnosis not present

## 2011-07-16 DIAGNOSIS — Z471 Aftercare following joint replacement surgery: Secondary | ICD-10-CM | POA: Diagnosis not present

## 2011-07-16 DIAGNOSIS — R269 Unspecified abnormalities of gait and mobility: Secondary | ICD-10-CM | POA: Diagnosis not present

## 2011-07-16 DIAGNOSIS — I10 Essential (primary) hypertension: Secondary | ICD-10-CM | POA: Diagnosis not present

## 2011-07-17 DIAGNOSIS — Z96659 Presence of unspecified artificial knee joint: Secondary | ICD-10-CM | POA: Diagnosis not present

## 2011-07-17 DIAGNOSIS — Z471 Aftercare following joint replacement surgery: Secondary | ICD-10-CM | POA: Diagnosis not present

## 2011-07-17 DIAGNOSIS — I10 Essential (primary) hypertension: Secondary | ICD-10-CM | POA: Diagnosis not present

## 2011-07-17 DIAGNOSIS — R269 Unspecified abnormalities of gait and mobility: Secondary | ICD-10-CM | POA: Diagnosis not present

## 2011-07-20 DIAGNOSIS — I10 Essential (primary) hypertension: Secondary | ICD-10-CM | POA: Diagnosis not present

## 2011-07-20 DIAGNOSIS — R269 Unspecified abnormalities of gait and mobility: Secondary | ICD-10-CM | POA: Diagnosis not present

## 2011-07-20 DIAGNOSIS — Z471 Aftercare following joint replacement surgery: Secondary | ICD-10-CM | POA: Diagnosis not present

## 2011-07-20 DIAGNOSIS — Z96659 Presence of unspecified artificial knee joint: Secondary | ICD-10-CM | POA: Diagnosis not present

## 2011-07-23 DIAGNOSIS — M25569 Pain in unspecified knee: Secondary | ICD-10-CM | POA: Diagnosis not present

## 2011-07-26 DIAGNOSIS — M25569 Pain in unspecified knee: Secondary | ICD-10-CM | POA: Diagnosis not present

## 2011-07-28 DIAGNOSIS — M25569 Pain in unspecified knee: Secondary | ICD-10-CM | POA: Diagnosis not present

## 2011-07-30 DIAGNOSIS — M25569 Pain in unspecified knee: Secondary | ICD-10-CM | POA: Diagnosis not present

## 2011-08-02 DIAGNOSIS — M25569 Pain in unspecified knee: Secondary | ICD-10-CM | POA: Diagnosis not present

## 2011-08-04 DIAGNOSIS — M25569 Pain in unspecified knee: Secondary | ICD-10-CM | POA: Diagnosis not present

## 2011-08-06 DIAGNOSIS — M25569 Pain in unspecified knee: Secondary | ICD-10-CM | POA: Diagnosis not present

## 2011-08-09 DIAGNOSIS — M25569 Pain in unspecified knee: Secondary | ICD-10-CM | POA: Diagnosis not present

## 2011-08-13 DIAGNOSIS — M25569 Pain in unspecified knee: Secondary | ICD-10-CM | POA: Diagnosis not present

## 2011-08-16 DIAGNOSIS — M25569 Pain in unspecified knee: Secondary | ICD-10-CM | POA: Diagnosis not present

## 2011-08-20 DIAGNOSIS — M25569 Pain in unspecified knee: Secondary | ICD-10-CM | POA: Diagnosis not present

## 2011-08-23 DIAGNOSIS — M25569 Pain in unspecified knee: Secondary | ICD-10-CM | POA: Diagnosis not present

## 2011-08-26 DIAGNOSIS — M25569 Pain in unspecified knee: Secondary | ICD-10-CM | POA: Diagnosis not present

## 2011-08-27 ENCOUNTER — Other Ambulatory Visit: Payer: Self-pay | Admitting: Oncology

## 2011-08-27 ENCOUNTER — Telehealth: Payer: Self-pay | Admitting: Oncology

## 2011-08-27 DIAGNOSIS — Z853 Personal history of malignant neoplasm of breast: Secondary | ICD-10-CM

## 2011-08-27 NOTE — Telephone Encounter (Signed)
lmonvm for pt re appt for 8/28 and mammo for 12/23. Schedule mailed.

## 2011-08-30 DIAGNOSIS — M25569 Pain in unspecified knee: Secondary | ICD-10-CM | POA: Diagnosis not present

## 2011-09-03 DIAGNOSIS — M25569 Pain in unspecified knee: Secondary | ICD-10-CM | POA: Diagnosis not present

## 2011-09-07 DIAGNOSIS — I781 Nevus, non-neoplastic: Secondary | ICD-10-CM | POA: Diagnosis not present

## 2011-09-07 DIAGNOSIS — L821 Other seborrheic keratosis: Secondary | ICD-10-CM | POA: Diagnosis not present

## 2011-09-07 DIAGNOSIS — Z85828 Personal history of other malignant neoplasm of skin: Secondary | ICD-10-CM | POA: Diagnosis not present

## 2011-09-07 DIAGNOSIS — M25569 Pain in unspecified knee: Secondary | ICD-10-CM | POA: Diagnosis not present

## 2011-09-07 DIAGNOSIS — L820 Inflamed seborrheic keratosis: Secondary | ICD-10-CM | POA: Diagnosis not present

## 2011-09-10 DIAGNOSIS — M25569 Pain in unspecified knee: Secondary | ICD-10-CM | POA: Diagnosis not present

## 2011-09-10 DIAGNOSIS — Z0000 Encounter for general adult medical examination without abnormal findings: Secondary | ICD-10-CM | POA: Diagnosis not present

## 2011-09-14 DIAGNOSIS — M25569 Pain in unspecified knee: Secondary | ICD-10-CM | POA: Diagnosis not present

## 2011-09-17 DIAGNOSIS — M25569 Pain in unspecified knee: Secondary | ICD-10-CM | POA: Diagnosis not present

## 2011-09-20 DIAGNOSIS — M25569 Pain in unspecified knee: Secondary | ICD-10-CM | POA: Diagnosis not present

## 2011-09-27 DIAGNOSIS — M25569 Pain in unspecified knee: Secondary | ICD-10-CM | POA: Diagnosis not present

## 2011-09-29 ENCOUNTER — Telehealth: Payer: Self-pay | Admitting: Oncology

## 2011-09-29 ENCOUNTER — Ambulatory Visit (HOSPITAL_BASED_OUTPATIENT_CLINIC_OR_DEPARTMENT_OTHER): Payer: Medicare Other | Admitting: Oncology

## 2011-09-29 ENCOUNTER — Encounter: Payer: Self-pay | Admitting: Oncology

## 2011-09-29 VITALS — BP 169/82 | HR 64 | Temp 97.1°F | Resp 20 | Ht 68.0 in | Wt 200.9 lb

## 2011-09-29 DIAGNOSIS — I10 Essential (primary) hypertension: Secondary | ICD-10-CM

## 2011-09-29 DIAGNOSIS — Z170 Estrogen receptor positive status [ER+]: Secondary | ICD-10-CM

## 2011-09-29 DIAGNOSIS — D0590 Unspecified type of carcinoma in situ of unspecified breast: Secondary | ICD-10-CM | POA: Diagnosis not present

## 2011-09-29 DIAGNOSIS — D0510 Intraductal carcinoma in situ of unspecified breast: Secondary | ICD-10-CM

## 2011-09-29 NOTE — Patient Instructions (Signed)
Mammogram 01-24-12 as scheduled

## 2011-09-29 NOTE — Progress Notes (Signed)
OFFICE PROGRESS NOTE   09/29/2011   Physicians:M.Babaoff (PCP), H.Derrell Lolling, J.Edwards, F.Rowan  INTERVAL HISTORY:  Patient is seen, alone for visit, in yearly follow up of her history of DCIS left breast, on observation only thru this office.  History is of DCIS left breast diagnosed Jan 2008 and treated with partial mastectomy, ER/PR positive. She had local radiation and attempted tamoxifen from March 2008 thru Aug 2008 but was unable to tolerate due to extreme hot flashes. She has had no known active disease since that surgery. Last bilateral mammograms were at [facility] 01-21-11.  Patient had right knee replacement by Dr Turner Daniels 06-30-11. Overall she is much more comfortable and mobile than prior to surgery, tho she is still receiving outpatient PT and does not have quite full range of motion. She has otherwise been doing well, without any recent infectious illness and no complaints that seem referable to the breast cancer history or that treatment. She is for colonoscopy by Dr Randa Evens in Sept 2013, history of right colectomy for villous adenoma in 2005. She has had no changes in bowel habits (has chronic urgency when bowels move since that surgery + surgery for hemorrhoids) including no noted blood in stools. She has had no other bleeding.  Remainder of 10 point Review of Systems negative.  Objective:  Vital signs in last 24 hours:  BP 169/82  Pulse 64  Temp 97.1 F (36.2 C) (Oral)  Resp 20  Ht 5\' 8"  (1.727 m)  Wt 200 lb 14.4 oz (91.128 kg)  BMI 30.55 kg/m2 Weight is down ~ 3 lbs. Ambulating easily and needs minimal assistance onto exam table. Respirations not labored, otherwise looks comfortable.  HEENT:PERRLA, sclera clear, anicteric, oropharynx clear, no lesions and neck supple with midline trachea LymphaticsCervical, supraclavicular, and axillary nodes normal. no inguinal adenopathy Resp: clear to auscultation bilaterally and normal percussion bilaterally Cardio: regular rate  and rhythm GI: soft, non-tender; bowel sounds normal; no masses,  no organomegaly Extremities: extremities normal, atraumatic, no cyanosis or edema including no swelling either UE.Surgical scar right knee appears well healed. Unable to full extend right knee, no swelling or erythema there. Neuro:nonfocal Breasts: left with tissue defect and surgical scar well-healed superiorly, still some discoloration and slight skin dryness in RT field, no dominant mass or skin or nipple findings of concern and left axilla benign. Right breast likewise without dominant mass, skin or nipple findings and right axilla benign. Skin without rash or petechiae.  We did not plan to repeat labs today. Most recent in EMR are from hospitalization for knee replacement:   Results for orders placed during the hospital encounter of 06/30/11  CBC      Component Value Range   WBC 10.5  4.0 - 10.5 K/uL   RBC 3.48 (*) 3.87 - 5.11 MIL/uL   Hemoglobin 9.9 (*) 12.0 - 15.0 g/dL   HCT 16.1 (*) 09.6 - 04.5 %   MCV 88.5  78.0 - 100.0 fL   MCH 28.4  26.0 - 34.0 pg   MCHC 32.1  30.0 - 36.0 g/dL   RDW 40.9  81.1 - 91.4 %   Platelets 187  150 - 400 K/uL  BASIC METABOLIC PANEL      Component Value Range   Sodium 137  135 - 145 mEq/L   Potassium 3.5  3.5 - 5.1 mEq/L   Chloride 98  96 - 112 mEq/L   CO2 29  19 - 32 mEq/L   Glucose, Bld 128 (*) 70 - 99 mg/dL  BUN 12  6 - 23 mg/dL   Creatinine, Ser 9.60  0.50 - 1.10 mg/dL   Calcium 8.5  8.4 - 45.4 mg/dL   GFR calc non Af Amer 85 (*) >90 mL/min   GFR calc Af Amer >90  >90 mL/min  CBC      Component Value Range   WBC 10.6 (*) 4.0 - 10.5 K/uL   RBC 3.18 (*) 3.87 - 5.11 MIL/uL   Hemoglobin 9.3 (*) 12.0 - 15.0 g/dL   HCT 09.8 (*) 11.9 - 14.7 %   MCV 89.0  78.0 - 100.0 fL   MCH 29.2  26.0 - 34.0 pg   MCHC 32.9  30.0 - 36.0 g/dL   RDW 82.9  56.2 - 13.0 %   Platelets 161  150 - 400 K/uL  CBC      Component Value Range   WBC 9.8  4.0 - 10.5 K/uL   RBC 2.81 (*) 3.87 - 5.11  MIL/uL   Hemoglobin 8.0 (*) 12.0 - 15.0 g/dL   HCT 86.5 (*) 78.4 - 69.6 %   MCV 89.0  78.0 - 100.0 fL   MCH 28.5  26.0 - 34.0 pg   MCHC 32.0  30.0 - 36.0 g/dL   RDW 29.5 (*) 28.4 - 13.2 %   Platelets 169  150 - 400 K/uL     Studies/Results:  [facility] Medications: I have reviewed the patient's current medications.  Assessment/Plan: 1.DCIS left breast: history as above, now on observation. Will see her back in 1 year or sooner if needed; patient is most comfortable continuing some follow up at this office for now.Bilateral mammograms Dec. 2.right knee replacement in May: continuing to improve 3.post op anemia: note we did not repeat cbc today but this may be appropriate with PCP 4.right colectomy 2005 for villous adenoma: colonoscopy next month 5.post hysterectomy and right oophorectomy prior to the breast cancer diagnosis   Patient was comfortable with plan as above     Candace Begue P, MD   09/29/2011, 2:48 PM

## 2011-09-29 NOTE — Telephone Encounter (Signed)
Gave pt appt calendar for August 2014 MD

## 2011-09-29 NOTE — Telephone Encounter (Signed)
Pt PCP will draw labs for the one year visit with Dr. Darrold Span

## 2011-09-30 DIAGNOSIS — D0510 Intraductal carcinoma in situ of unspecified breast: Secondary | ICD-10-CM | POA: Insufficient documentation

## 2011-10-01 DIAGNOSIS — M25569 Pain in unspecified knee: Secondary | ICD-10-CM | POA: Diagnosis not present

## 2011-10-05 DIAGNOSIS — M25569 Pain in unspecified knee: Secondary | ICD-10-CM | POA: Diagnosis not present

## 2011-10-06 DIAGNOSIS — Z23 Encounter for immunization: Secondary | ICD-10-CM | POA: Diagnosis not present

## 2011-10-15 DIAGNOSIS — M25569 Pain in unspecified knee: Secondary | ICD-10-CM | POA: Diagnosis not present

## 2011-10-26 ENCOUNTER — Other Ambulatory Visit: Payer: Self-pay | Admitting: Gastroenterology

## 2011-10-26 DIAGNOSIS — K5730 Diverticulosis of large intestine without perforation or abscess without bleeding: Secondary | ICD-10-CM | POA: Diagnosis not present

## 2011-10-26 DIAGNOSIS — Z09 Encounter for follow-up examination after completed treatment for conditions other than malignant neoplasm: Secondary | ICD-10-CM | POA: Diagnosis not present

## 2011-10-26 DIAGNOSIS — K648 Other hemorrhoids: Secondary | ICD-10-CM | POA: Diagnosis not present

## 2011-10-26 DIAGNOSIS — D126 Benign neoplasm of colon, unspecified: Secondary | ICD-10-CM | POA: Diagnosis not present

## 2011-10-26 DIAGNOSIS — Z86010 Personal history of colonic polyps: Secondary | ICD-10-CM | POA: Diagnosis not present

## 2011-12-14 DIAGNOSIS — M1710 Unilateral primary osteoarthritis, unspecified knee: Secondary | ICD-10-CM | POA: Diagnosis not present

## 2012-01-03 DIAGNOSIS — N949 Unspecified condition associated with female genital organs and menstrual cycle: Secondary | ICD-10-CM | POA: Diagnosis not present

## 2012-01-04 ENCOUNTER — Other Ambulatory Visit: Payer: Self-pay | Admitting: Family Medicine

## 2012-01-04 ENCOUNTER — Ambulatory Visit
Admission: RE | Admit: 2012-01-04 | Discharge: 2012-01-04 | Disposition: A | Discharge status: Home / Self Care | Payer: Medicare Other | Source: Ambulatory Visit | Attending: Family Medicine | Admitting: Family Medicine

## 2012-01-04 DIAGNOSIS — N949 Unspecified condition associated with female genital organs and menstrual cycle: Secondary | ICD-10-CM

## 2012-01-24 ENCOUNTER — Ambulatory Visit
Admission: RE | Admit: 2012-01-24 | Discharge: 2012-01-24 | Disposition: A | Discharge status: Home / Self Care | Payer: Medicare Other | Source: Ambulatory Visit | Attending: Oncology | Admitting: Oncology

## 2012-01-24 DIAGNOSIS — R928 Other abnormal and inconclusive findings on diagnostic imaging of breast: Secondary | ICD-10-CM | POA: Diagnosis not present

## 2012-01-24 DIAGNOSIS — Z853 Personal history of malignant neoplasm of breast: Secondary | ICD-10-CM | POA: Diagnosis not present

## 2012-02-11 DIAGNOSIS — H60399 Other infective otitis externa, unspecified ear: Secondary | ICD-10-CM | POA: Diagnosis not present

## 2012-03-13 DIAGNOSIS — I10 Essential (primary) hypertension: Secondary | ICD-10-CM | POA: Diagnosis not present

## 2012-03-13 DIAGNOSIS — Z79899 Other long term (current) drug therapy: Secondary | ICD-10-CM | POA: Diagnosis not present

## 2012-03-13 DIAGNOSIS — G3184 Mild cognitive impairment, so stated: Secondary | ICD-10-CM | POA: Diagnosis not present

## 2012-03-13 DIAGNOSIS — E7800 Pure hypercholesterolemia, unspecified: Secondary | ICD-10-CM | POA: Diagnosis not present

## 2012-03-13 DIAGNOSIS — R32 Unspecified urinary incontinence: Secondary | ICD-10-CM | POA: Diagnosis not present

## 2012-04-18 DIAGNOSIS — R1031 Right lower quadrant pain: Secondary | ICD-10-CM | POA: Diagnosis not present

## 2012-08-01 ENCOUNTER — Telehealth: Payer: Self-pay | Admitting: Oncology

## 2012-08-01 NOTE — Telephone Encounter (Signed)
pt called to r/s appt....Done °

## 2012-08-08 ENCOUNTER — Telehealth: Payer: Self-pay | Admitting: Oncology

## 2012-08-08 NOTE — Telephone Encounter (Signed)
s.w. pt and advised on 8.1.14 appt being changed due to being a holiday

## 2012-08-31 DIAGNOSIS — E7800 Pure hypercholesterolemia, unspecified: Secondary | ICD-10-CM | POA: Diagnosis not present

## 2012-08-31 DIAGNOSIS — Z79899 Other long term (current) drug therapy: Secondary | ICD-10-CM | POA: Diagnosis not present

## 2012-08-31 DIAGNOSIS — Z0000 Encounter for general adult medical examination without abnormal findings: Secondary | ICD-10-CM | POA: Diagnosis not present

## 2012-08-31 DIAGNOSIS — I10 Essential (primary) hypertension: Secondary | ICD-10-CM | POA: Diagnosis not present

## 2012-09-21 DIAGNOSIS — Z85828 Personal history of other malignant neoplasm of skin: Secondary | ICD-10-CM | POA: Diagnosis not present

## 2012-09-21 DIAGNOSIS — I781 Nevus, non-neoplastic: Secondary | ICD-10-CM | POA: Diagnosis not present

## 2012-09-21 DIAGNOSIS — L821 Other seborrheic keratosis: Secondary | ICD-10-CM | POA: Diagnosis not present

## 2012-09-21 DIAGNOSIS — L723 Sebaceous cyst: Secondary | ICD-10-CM | POA: Diagnosis not present

## 2012-09-29 ENCOUNTER — Ambulatory Visit: Payer: Medicare Other

## 2012-10-02 ENCOUNTER — Ambulatory Visit: Payer: Medicare Other

## 2012-10-05 ENCOUNTER — Telehealth: Payer: Self-pay | Admitting: Oncology

## 2012-10-05 NOTE — Telephone Encounter (Signed)
returned pt call and r/s appt from 9.23 to 9.22 with Dr Cleophas Dunker

## 2012-10-09 ENCOUNTER — Ambulatory Visit: Payer: Medicare Other

## 2012-10-23 ENCOUNTER — Encounter: Payer: Self-pay | Admitting: Oncology

## 2012-10-23 ENCOUNTER — Telehealth: Payer: Self-pay | Admitting: *Deleted

## 2012-10-23 ENCOUNTER — Ambulatory Visit (HOSPITAL_BASED_OUTPATIENT_CLINIC_OR_DEPARTMENT_OTHER): Payer: Medicare Other | Admitting: Oncology

## 2012-10-23 VITALS — BP 144/76 | HR 56 | Temp 98.6°F | Resp 20 | Ht 68.0 in | Wt 201.6 lb

## 2012-10-23 DIAGNOSIS — Z853 Personal history of malignant neoplasm of breast: Secondary | ICD-10-CM | POA: Diagnosis not present

## 2012-10-23 DIAGNOSIS — D0512 Intraductal carcinoma in situ of left breast: Secondary | ICD-10-CM

## 2012-10-23 NOTE — Progress Notes (Signed)
OFFICE PROGRESS NOTE   10/23/2012   Physicians:[facility], H.Derrell Lolling, J.Edwards, F.Rowan   INTERVAL HISTORY:  Patient is seen, alone for visit, in yearly follow up of her history of DCIS left breast, followed thru this office on observation.  She is due yearly mammograms at [facility] in Dec 2014, which radiologist has requested again be diagnostic study. She has generally done well since she was here last, with exception of soreness upper left breast in region of lumpectomy for ~ past month. She has no history of trauma, feels the discomfort mostly with direct pressure, has not had fever, erythema, swelling associated. She has not tried anything for the soreness; she uses only occasional tylenol for any discomfort and is not on any daily ASA.   ONCOLOGIC HISTORY History is of DCIS left breast diagnosed Jan 2008 and treated with partial mastectomy, ER/PR positive. She had local radiation and attempted tamoxifen from March 2008 thru Aug 2008 but was unable to tolerate due to extreme hot flashes. She has had no known active disease since that surgery. Last bilateral mammograms were at [facility] 01-24-2012   Review of systems as above, also: No recent infectious illness. No back or neck pain. No respiratory symptoms. Appetite and energy good. Knee replacement has been fine. No bleeding. Remainder of 10 point Review of Systems negative.   We have discussed weight loss techniques, including increasing walking by 5000 steps daily and omitting certain high calorie foods.  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  Objective:  Vital signs in last 24 hours:  BP 144/76  Pulse 56  Temp(Src) 98.6 F (37 C) (Oral)  Resp 20  Ht 5\' 8"  (1.727 m)  Wt 201 lb 9.6 oz (91.445 kg)  BMI 30.66 kg/m2 this weight is stable from a year ago  Alert, oriented and appropriate. Ambulatory without difficulty.  Very pleasant as always, looks  comfortable.  HEENT:PERRL, sclerae not icteric. Oral mucosa moist without lesions, posterior pharynx clear.  Neck supple. No JVD.  Lymphatics:no cervical,suraclavicular, axillary or inguinal adenopathy Resp: clear to auscultation bilaterally and normal percussion bilaterally Cardio: regular rate and rhythm. No gallop. GI: soft, nontender, not distended, no mass or organomegaly. Normally active bowel sounds. Musculoskeletal/ Extremities: without pitting edema, cords, tenderness Neuro:  nonfocal Skin without rash, ecchymosis, petechiae Breasts: right without dominant mass, skin or nipple findings. Left with well healed lumpectomy scar superiorly, with some local changes consistent with surgical scar and RT, no dominant mass, no skin changes of concern, mild soreness thru that area. No swelling either UE.Axillae benign.   Lab Results: Per patient, labs done at [facility] in last ~ 2 months.  Studies/Results:  Mammograms (diagnostic, bilateral) done at [facility] 01-24-12 report reviewed now.  Medications: I have reviewed the patient's current medications. She plans to have flu shot done elsewhere this fall. She will try either 81 mg ASA or aleve daily or every other day with food during the next week or so.   She will let me know if OTC antiinflammatory as above does not help the left breast soreness, in which case she may need imaging with [place] or otherwise prior to mammograms due in Dec.    Assessment/Plan: 1.DCIS left breast: history as above, now on observation. Bilateral mammograms Dec. Exam not remarkable tho patient has some breast soreness: plan as above. I will see her back after the Dec mammograms to follow up the symptoms today, otherwise has been on  yearly follow up at this office. 2.right knee replacement 2013 doing well 3..right colectomy 2005 for villous adenoma: colonoscopy per [facility] (apparently not due fall 2013 as we had thought)  4.post hysterectomy and  right oophorectomy prior to the breast cancer diagnosis Flu vaccine to be done elsewhere.    Katie Stanke P, MD   10/23/2012, 1:11 PM

## 2012-10-23 NOTE — Telephone Encounter (Signed)
appts made and printed...td 

## 2012-10-23 NOTE — Patient Instructions (Signed)
Try 81 mg ASA OR Aleve one daily or every other day for next week or so to see if this helps the soreness in left breast. If soreness persists, call Dr Darrold Span and we will set up ultrasound

## 2012-10-24 ENCOUNTER — Ambulatory Visit: Payer: Medicare Other

## 2012-10-24 ENCOUNTER — Telehealth: Payer: Self-pay | Admitting: *Deleted

## 2012-10-24 NOTE — Telephone Encounter (Signed)
Pt needs an appt for 1/22015 and theres not template. Printed order and gv to Katie Carrillo to place in 2015 folder...td

## 2012-10-30 DIAGNOSIS — Z961 Presence of intraocular lens: Secondary | ICD-10-CM | POA: Diagnosis not present

## 2012-12-01 DIAGNOSIS — T07XXXA Unspecified multiple injuries, initial encounter: Secondary | ICD-10-CM | POA: Diagnosis not present

## 2012-12-01 DIAGNOSIS — T148XXA Other injury of unspecified body region, initial encounter: Secondary | ICD-10-CM | POA: Diagnosis not present

## 2012-12-15 ENCOUNTER — Telehealth: Payer: Self-pay | Admitting: *Deleted

## 2012-12-15 NOTE — Telephone Encounter (Signed)
sw pt gv appt for 02/05/13 @ 9:30am w/ LL. Pt requested that i mail it...td

## 2013-01-29 ENCOUNTER — Ambulatory Visit
Admission: RE | Admit: 2013-01-29 | Discharge: 2013-01-29 | Disposition: A | Discharge status: Home / Self Care | Payer: Medicare Other | Source: Ambulatory Visit | Attending: Oncology | Admitting: Oncology

## 2013-01-29 DIAGNOSIS — D0512 Intraductal carcinoma in situ of left breast: Secondary | ICD-10-CM

## 2013-01-29 DIAGNOSIS — N6489 Other specified disorders of breast: Secondary | ICD-10-CM | POA: Diagnosis not present

## 2013-01-29 DIAGNOSIS — Z853 Personal history of malignant neoplasm of breast: Secondary | ICD-10-CM | POA: Diagnosis not present

## 2013-02-02 ENCOUNTER — Telehealth: Payer: Self-pay | Admitting: Oncology

## 2013-02-05 ENCOUNTER — Ambulatory Visit: Payer: Medicare Other | Admitting: Oncology

## 2013-02-11 ENCOUNTER — Encounter: Payer: Self-pay | Admitting: Oncology

## 2013-02-11 ENCOUNTER — Other Ambulatory Visit: Payer: Self-pay | Admitting: Oncology

## 2013-02-13 ENCOUNTER — Telehealth: Payer: Self-pay | Admitting: Oncology

## 2013-02-13 NOTE — Telephone Encounter (Signed)
pt called and r/s 1/14 appt to 1/28 due to sore throat.

## 2013-02-14 ENCOUNTER — Ambulatory Visit: Payer: Medicare Other | Admitting: Oncology

## 2013-02-28 ENCOUNTER — Telehealth: Payer: Self-pay | Admitting: Oncology

## 2013-02-28 ENCOUNTER — Ambulatory Visit (HOSPITAL_BASED_OUTPATIENT_CLINIC_OR_DEPARTMENT_OTHER): Payer: Medicare Other | Admitting: Oncology

## 2013-02-28 ENCOUNTER — Other Ambulatory Visit: Payer: Self-pay | Admitting: Oncology

## 2013-02-28 ENCOUNTER — Encounter: Payer: Self-pay | Admitting: Oncology

## 2013-02-28 VITALS — BP 132/66 | HR 66 | Temp 98.5°F | Resp 20 | Ht 68.0 in | Wt 202.6 lb

## 2013-02-28 DIAGNOSIS — Z1231 Encounter for screening mammogram for malignant neoplasm of breast: Secondary | ICD-10-CM

## 2013-02-28 DIAGNOSIS — Z853 Personal history of malignant neoplasm of breast: Secondary | ICD-10-CM | POA: Diagnosis not present

## 2013-02-28 NOTE — Telephone Encounter (Signed)
, °

## 2013-02-28 NOTE — Progress Notes (Signed)
OFFICE PROGRESS NOTE   02/28/2013   Physicians: A.Nnodi (PCP), H.Dalbert Batman, J.Edwards, F.Rowan   INTERVAL HISTORY:   Patient is seen, alone for visit, in follow up of history of DCIS left breast and discomfort on that side for past several months.. She had bilateral diagnostic mammograms with spot compression films at [facility] 01-29-13, with heterogeneously dense breast tissue but no mammographic findings of concern including in the area of soreness at lumpectomy site on left. The discomfort is intermittent, worse when she lies on left side, but otherwise no clear precipitating or alleviating factors.  She is otherwise feeling well, with no recent infectious illness, no other new or different pain, no skin rash, no respiratory or cardiac symptoms.  PCP now is Dr Orland Penman, whom she will see for PE in Feb.  ONCOLOGIC HISTORY History is of DCIS left breast diagnosed Jan 2008 and treated with partial mastectomy, ER/PR positive. She had local radiation and attempted tamoxifen from March 2008 thru Aug 2008 but was unable to tolerate due to extreme hot flashes. She has had no known active disease since that surgery.   Review of systems as above, also: No changes in either breast on self exam. Good, nonpainful ROM left shoulder. Remainder of 10 point Review of Systems negative.  Husband has had MRSA x2 in past several months.  Objective:  Vital signs in last 24 hours:  BP 132/66  Pulse 66  Temp(Src) 98.5 F (36.9 C) (Oral)  Resp 20  Ht 5\' 8"  (1.727 m)  Wt 202 lb 9.6 oz (91.899 kg)  BMI 30.81 kg/m2 weight is up 1 lb.  Alert, oriented and appropriate, looks comfortable, very pleasant as always. Ambulatory without difficulty.  Alopecia  HEENT:PERRL, sclerae not icteric. Oral mucosa moist without lesions, posterior pharynx clear.  Neck supple. No JVD.  Lymphatics:no cervical,suraclavicular, axillary or inguinal adenopathy Resp: clear to auscultation bilaterally and normal percussion  bilaterally Cardio: regular rate and rhythm. No gallop. GI: soft, nontender, not distended, no mass or organomegaly. Normally active bowel sounds. Surgical incision not remarkable. Musculoskeletal/ Extremities: without pitting edema, cords, tenderness. Spine/ back not tender. Full, easy ROM left shoulder. Tenderness to palpation at mid and lower left costosternal junctions, reproducing the left upper breast pain. Neuro: no peripheral neuropathy. Otherwise nonfocal Skin without rash, ecchymosis, petechiae. No findings across left chest or sternum. Breasts: without dominant mass, skin or nipple findings. Left lumpectomy scar not remarkable. Axillae benign.   Lab: Per PCP, not done at this office now  Studies/Results: DIGITAL DIAGNOSTIC BILATERAL MAMMOGRAM WITH CAD  01-29-2013 COMPARISON: Previous exams.  ACR Breast Density Category c: The breasts are heterogeneously  dense, which may obscure small masses.  FINDINGS:  No suspicious masses or calcifications are seen in either breast.  Postsurgical changes are seen in the left breast from prior  lumpectomy. A spot compression tangential view of the lumpectomy  site in the left breast was performed, with no mammographic evidence  of locally recurrent malignancy seen.  Mammographic images were processed with CAD.  IMPRESSION:  No mammographic evidence of malignancy in either breast.  RECOMMENDATION:  Diagnostic mammogram is suggested in 1 year.   Medications: I have reviewed the patient's current medications.  DISCUSSION: unremarkable mammograms in late Dec reassuring. Pain in left breast in region of lumpectomy scar now seems referred from costosternal joints, likely inflammation there. I have asked her to try Aleve bid with food for next 7-10 days. If this does not significantly improve or resolve the discomfort, would consider rib  detail and sternal detail xrays. Patient is to call my RN in 7-10 days to let us know how she is with the  NSAID. As long as she is doing better, I will see her back in a year, which should be shortly after next mammograms.  Assessment/Plan:  1.DCIS left breast: history as above, now on observation. Bilateral mammograms 01-29-13 not remarkable. Pain left breast now seems to be related to point tenderness at lower left costosternal junctions; try NSAID x 7-10 days as above.  2.right knee replacement 2013 doing well  3..right colectomy 2005 for villous adenoma: colonoscopy per [facility] (apparently not due fall 2013 as we had thought)  4.post hysterectomy and right oophorectomy prior to the breast cancer diagnosis  5.Flu vaccine done elsewhere.   Patient is in agreement with plan and has had questions answered. She knows that I am glad to see her at any time prior to next scheduled visit if needed.      LIVESAY,[name], MD   02/28/2013, 9:50 PM

## 2013-02-28 NOTE — Patient Instructions (Addendum)
You seem to have inflammation at the joints where ribs join sternum. Please try Aleve one tablet twice daily with food for 7-10 days, take whether or not you are noticing pain during that time. Call Dr Mariana Kaufman RN after 7-10 days to let us know how you are [phone]

## 2013-03-06 DIAGNOSIS — J329 Chronic sinusitis, unspecified: Secondary | ICD-10-CM | POA: Diagnosis not present

## 2013-03-06 DIAGNOSIS — R413 Other amnesia: Secondary | ICD-10-CM | POA: Diagnosis not present

## 2013-03-06 DIAGNOSIS — I10 Essential (primary) hypertension: Secondary | ICD-10-CM | POA: Diagnosis not present

## 2013-03-06 DIAGNOSIS — N3941 Urge incontinence: Secondary | ICD-10-CM | POA: Diagnosis not present

## 2013-09-05 DIAGNOSIS — I10 Essential (primary) hypertension: Secondary | ICD-10-CM | POA: Diagnosis not present

## 2013-09-05 DIAGNOSIS — N3941 Urge incontinence: Secondary | ICD-10-CM | POA: Diagnosis not present

## 2013-09-05 DIAGNOSIS — J309 Allergic rhinitis, unspecified: Secondary | ICD-10-CM | POA: Diagnosis not present

## 2013-09-05 DIAGNOSIS — F09 Unspecified mental disorder due to known physiological condition: Secondary | ICD-10-CM | POA: Diagnosis not present

## 2013-09-05 DIAGNOSIS — Z23 Encounter for immunization: Secondary | ICD-10-CM | POA: Diagnosis not present

## 2013-09-11 DIAGNOSIS — E782 Mixed hyperlipidemia: Secondary | ICD-10-CM | POA: Diagnosis not present

## 2013-09-11 DIAGNOSIS — I10 Essential (primary) hypertension: Secondary | ICD-10-CM | POA: Diagnosis not present

## 2013-10-30 ENCOUNTER — Other Ambulatory Visit: Payer: Self-pay | Admitting: Family Medicine

## 2013-10-30 ENCOUNTER — Ambulatory Visit
Admission: RE | Admit: 2013-10-30 | Discharge: 2013-10-30 | Disposition: A | Discharge status: Home / Self Care | Payer: Medicare Other | Source: Ambulatory Visit | Attending: Family Medicine | Admitting: Family Medicine

## 2013-10-30 DIAGNOSIS — M79609 Pain in unspecified limb: Secondary | ICD-10-CM | POA: Diagnosis not present

## 2013-10-30 DIAGNOSIS — R0789 Other chest pain: Secondary | ICD-10-CM

## 2013-10-30 DIAGNOSIS — M47814 Spondylosis without myelopathy or radiculopathy, thoracic region: Secondary | ICD-10-CM | POA: Diagnosis not present

## 2013-10-30 DIAGNOSIS — M79645 Pain in left finger(s): Secondary | ICD-10-CM

## 2013-10-30 DIAGNOSIS — S6990XA Unspecified injury of unspecified wrist, hand and finger(s), initial encounter: Secondary | ICD-10-CM | POA: Diagnosis not present

## 2013-10-30 DIAGNOSIS — R079 Chest pain, unspecified: Secondary | ICD-10-CM | POA: Diagnosis not present

## 2013-10-30 DIAGNOSIS — R0781 Pleurodynia: Secondary | ICD-10-CM

## 2013-10-30 DIAGNOSIS — Z23 Encounter for immunization: Secondary | ICD-10-CM | POA: Diagnosis not present

## 2013-10-30 DIAGNOSIS — S6980XA Other specified injuries of unspecified wrist, hand and finger(s), initial encounter: Secondary | ICD-10-CM | POA: Diagnosis not present

## 2013-10-30 DIAGNOSIS — S298XXA Other specified injuries of thorax, initial encounter: Secondary | ICD-10-CM | POA: Diagnosis not present

## 2013-10-30 DIAGNOSIS — IMO0002 Reserved for concepts with insufficient information to code with codable children: Secondary | ICD-10-CM | POA: Diagnosis not present

## 2013-11-17 ENCOUNTER — Telehealth: Payer: Self-pay | Admitting: Oncology

## 2013-11-20 DIAGNOSIS — L821 Other seborrheic keratosis: Secondary | ICD-10-CM | POA: Diagnosis not present

## 2013-11-20 DIAGNOSIS — L570 Actinic keratosis: Secondary | ICD-10-CM | POA: Diagnosis not present

## 2013-11-20 DIAGNOSIS — D1800 Hemangioma unspecified site: Secondary | ICD-10-CM | POA: Diagnosis not present

## 2013-11-20 DIAGNOSIS — Z85828 Personal history of other malignant neoplasm of skin: Secondary | ICD-10-CM | POA: Diagnosis not present

## 2013-11-20 DIAGNOSIS — L219 Seborrheic dermatitis, unspecified: Secondary | ICD-10-CM | POA: Diagnosis not present

## 2013-11-22 DIAGNOSIS — Z961 Presence of intraocular lens: Secondary | ICD-10-CM | POA: Diagnosis not present

## 2013-11-22 DIAGNOSIS — H26493 Other secondary cataract, bilateral: Secondary | ICD-10-CM | POA: Diagnosis not present

## 2013-11-28 DIAGNOSIS — H26492 Other secondary cataract, left eye: Secondary | ICD-10-CM | POA: Diagnosis not present

## 2013-11-28 DIAGNOSIS — H2640 Unspecified secondary cataract: Secondary | ICD-10-CM | POA: Diagnosis not present

## 2013-12-24 DIAGNOSIS — L118 Other specified acantholytic disorders: Secondary | ICD-10-CM | POA: Diagnosis not present

## 2013-12-24 DIAGNOSIS — H02402 Unspecified ptosis of left eyelid: Secondary | ICD-10-CM | POA: Diagnosis not present

## 2013-12-24 DIAGNOSIS — H02834 Dermatochalasis of left upper eyelid: Secondary | ICD-10-CM | POA: Diagnosis not present

## 2013-12-24 DIAGNOSIS — H02831 Dermatochalasis of right upper eyelid: Secondary | ICD-10-CM | POA: Diagnosis not present

## 2013-12-24 DIAGNOSIS — H11431 Conjunctival hyperemia, right eye: Secondary | ICD-10-CM | POA: Diagnosis not present

## 2014-01-05 ENCOUNTER — Telehealth: Payer: Self-pay | Admitting: Oncology

## 2014-01-05 NOTE — Telephone Encounter (Signed)
lvm for pt regarding to Jan d.t change....mailed pt appt sched and letter

## 2014-01-30 ENCOUNTER — Encounter (INDEPENDENT_AMBULATORY_CARE_PROVIDER_SITE_OTHER): Payer: Self-pay

## 2014-01-30 ENCOUNTER — Ambulatory Visit
Admission: RE | Admit: 2014-01-30 | Discharge: 2014-01-30 | Disposition: A | Discharge status: Home / Self Care | Payer: Medicare Other | Source: Ambulatory Visit | Attending: Oncology | Admitting: Oncology

## 2014-01-30 DIAGNOSIS — R922 Inconclusive mammogram: Secondary | ICD-10-CM | POA: Diagnosis not present

## 2014-01-30 DIAGNOSIS — Z853 Personal history of malignant neoplasm of breast: Secondary | ICD-10-CM | POA: Diagnosis not present

## 2014-02-24 ENCOUNTER — Other Ambulatory Visit: Payer: Self-pay | Admitting: Oncology

## 2014-02-25 ENCOUNTER — Telehealth: Payer: Self-pay

## 2014-02-25 NOTE — Telephone Encounter (Signed)
Spoke with receptionist [place].  She will sent a message to medical records to have labs faxed to Dr. Mariana Kaufman office.  [phone].

## 2014-02-25 NOTE — Telephone Encounter (Signed)
-----   Message from Gordy Levan, MD sent at 02/24/2014  7:05 PM EST ----- To see LL 1-28  Please contact PCP, I believe Dr Loni Muse. Nnodi, and get copies of most recent CBC and chemistries (Bmet or CMET) done since I saw her 02-2013. If these are available can cancel lab here 1-28; if not done please order cbc cmet for 1-28  thanks

## 2014-02-26 DIAGNOSIS — N390 Urinary tract infection, site not specified: Secondary | ICD-10-CM | POA: Diagnosis not present

## 2014-02-27 ENCOUNTER — Other Ambulatory Visit: Payer: Self-pay

## 2014-02-27 ENCOUNTER — Other Ambulatory Visit: Payer: Medicare Other

## 2014-02-27 ENCOUNTER — Ambulatory Visit: Payer: Medicare Other | Admitting: Oncology

## 2014-02-27 DIAGNOSIS — D0510 Intraductal carcinoma in situ of unspecified breast: Secondary | ICD-10-CM

## 2014-02-27 NOTE — Telephone Encounter (Signed)
Received  Chemistries done at Dr. Tracie Harrier office on 09-16-14.  Ordered a cbc/diff to be done tomorrow prior to visit.

## 2014-02-28 ENCOUNTER — Encounter: Payer: Self-pay | Admitting: Oncology

## 2014-02-28 ENCOUNTER — Other Ambulatory Visit: Payer: Self-pay | Admitting: Oncology

## 2014-02-28 ENCOUNTER — Telehealth: Payer: Self-pay | Admitting: Oncology

## 2014-02-28 ENCOUNTER — Other Ambulatory Visit (HOSPITAL_BASED_OUTPATIENT_CLINIC_OR_DEPARTMENT_OTHER): Payer: Medicare Other

## 2014-02-28 ENCOUNTER — Ambulatory Visit (HOSPITAL_BASED_OUTPATIENT_CLINIC_OR_DEPARTMENT_OTHER): Payer: Medicare Other | Admitting: Oncology

## 2014-02-28 VITALS — BP 155/64 | HR 86 | Temp 97.6°F | Resp 18 | Ht 68.0 in | Wt 204.2 lb

## 2014-02-28 DIAGNOSIS — D0510 Intraductal carcinoma in situ of unspecified breast: Secondary | ICD-10-CM

## 2014-02-28 DIAGNOSIS — Z853 Personal history of malignant neoplasm of breast: Secondary | ICD-10-CM | POA: Diagnosis not present

## 2014-02-28 DIAGNOSIS — R922 Inconclusive mammogram: Secondary | ICD-10-CM

## 2014-02-28 DIAGNOSIS — Z1231 Encounter for screening mammogram for malignant neoplasm of breast: Secondary | ICD-10-CM

## 2014-02-28 DIAGNOSIS — D0512 Intraductal carcinoma in situ of left breast: Secondary | ICD-10-CM

## 2014-02-28 LAB — CBC WITH DIFFERENTIAL/PLATELET
BASO%: 1.3 % (ref 0.0–2.0)
Basophils Absolute: 0.1 10*3/uL (ref 0.0–0.1)
EOS ABS: 0.2 10*3/uL (ref 0.0–0.5)
EOS%: 3.4 % (ref 0.0–7.0)
HCT: 41.2 % (ref 34.8–46.6)
HEMOGLOBIN: 13.1 g/dL (ref 11.6–15.9)
LYMPH%: 23.3 % (ref 14.0–49.7)
MCH: 28.6 pg (ref 25.1–34.0)
MCHC: 31.8 g/dL (ref 31.5–36.0)
MCV: 90 fL (ref 79.5–101.0)
MONO#: 0.4 10*3/uL (ref 0.1–0.9)
MONO%: 7.5 % (ref 0.0–14.0)
NEUT#: 3.4 10*3/uL (ref 1.5–6.5)
NEUT%: 64.5 % (ref 38.4–76.8)
Platelets: 204 10*3/uL (ref 145–400)
RBC: 4.58 10*6/uL (ref 3.70–5.45)
RDW: 15.4 % — ABNORMAL HIGH (ref 11.2–14.5)
WBC: 5.3 10*3/uL (ref 3.9–10.3)
lymph#: 1.2 10*3/uL (ref 0.9–3.3)

## 2014-02-28 NOTE — Telephone Encounter (Signed)
, °

## 2014-02-28 NOTE — Progress Notes (Signed)
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 She had local radiation and attempted tamoxifen from March 2008 thru Aug 2008 but was unable to tolerate due to extreme hot flashes. She has had no known active disease since that surgery. Last bilateral tomo mammograms were at [facility] 01-30-2014, with heterogeneously dense breast tissue but no other mammographic findings of concern.    INTERVAL HISTORY:   Patient has been doing well overall, with resolution of left lateral chest pain after several months, etiology not clear including CXR and bilateral rib xrays 10-2013. She is not aware of any changes in breasts.   Review of systems as above, also: No fever or symptoms of infection. No SOB or cough. No other different pain. No bleeding. Bowels ok. Energy good Remainder of 10 point Review of Systems negative.  She is caregiver for my patient Katie Carrillo. PCP will be changing, as Dr Orland Penman is moving to [place].  Objective:  Vital signs in last 24 hours:  BP 155/64 mmHg  Pulse 86  Temp(Src) 97.6 F (36.4 C) (Oral)  Resp 18  Ht 5\' 8"  (1.727 m)  Wt 204 lb 3.2 oz (92.625 kg)  BMI 31.06 kg/m2  SpO2 99%  Alert, oriented and appropriate. Ambulatory without difficulty.  No alopecia  HEENT:PERRL, sclerae not icteric. Oral mucosa moist without lesions, posterior pharynx clear.  Neck supple. No JVD.  Lymphatics:no cervical,supraclavicular, axillary adenopathy Resp: clear to auscultation bilaterally and normal percussion bilaterally Cardio: regular rate and rhythm. No gallop. JS:HFWYOVZ soft, nontender, not distended, no mass or organomegaly. Normally active bowel sounds. Surgical incision not remarkable. Musculoskeletal/ Extremities: without pitting edema, cords, tenderness. No tenderness left lateral chest  wall. Neuro:  nonfocal  PSYCH appropriate mood and affect Skin without rash, ecchymosis, petechiae Breasts: left lumpectomy scar well healed, otherwise bilaterally without dominant mass, skin or nipple findings. Axillae benign.   Lab Results:  Results for orders placed or performed in visit on 02/28/14  CBC with Differential  Result Value Ref Range   WBC 5.3 3.9 - 10.3 10e3/uL   NEUT# 3.4 1.5 - 6.5 10e3/uL   HGB 13.1 11.6 - 15.9 g/dL   HCT 41.2 34.8 - 46.6 %   Platelets 204 145 - 400 10e3/uL   MCV 90.0 79.5 - 101.0 fL   MCH 28.6 25.1 - 34.0 pg   MCHC 31.8 31.5 - 36.0 g/dL   RBC 4.58 3.70 - 5.45 10e6/uL   RDW 15.4 (H) 11.2 - 14.5 %   lymph# 1.2 0.9 - 3.3 10e3/uL   MONO# 0.4 0.1 - 0.9 10e3/uL   Eosinophils Absolute 0.2 0.0 - 0.5 10e3/uL   Basophils Absolute 0.1 0.0 - 0.1 10e3/uL   NEUT% 64.5 38.4 - 76.8 %   LYMPH% 23.3 14.0 - 49.7 %   MONO% 7.5 0.0 - 14.0 %   EOS% 3.4 0.0 - 7.0 %   BASO% 1.3 0.0 - 2.0 %    Chemistries obtained from Dr Nnodi's office from 09-11-13: Na 141, K 4.1, Cl 105, glucose 97, BUN 17, creat 0.79, T bili 0.5, ALP 47, AST 14, ALT 10, T prot 6.6, alb 4.5.  Also choles 209, trig 186   Studies/Results: DIGITAL DIAGNOSTIC BILATERAL MAMMOGRAM WITH 3D TOMOSYNTHESIS AND CAD  COMPARISON: With priors.  ACR Breast Density Category c: The breast tissue is heterogeneously dense, which may obscure small masses.  FINDINGS:  Stable lumpectomy changes are seen in the left breast. There is no suspicious mass or malignant type microcalcifications in either breast.  Mammographic images were processed with CAD.  IMPRESSION: No evidence of malignancy in either breast.  RECOMMENDATION: Bilateral diagnostic mammogram in 1 year is recommended.  Medications: I have reviewed the patient's current medications.  DISCUSSION Patient prefers to continue yearly follow up at this office. Tomo/ 3D mammograms best due to dense breast tissue.   Assessment/Plan:  1.DCIS  left breast: history as above, now on observation. Bilateral mammograms Dec.I will see her back in a year at her request 2.right knee replacement 2013 doing well 3..right colectomy 2005 for villous adenoma: colonoscopy per [facility] (apparently not due fall 2013 as we had thought)  4.post hysterectomy and right oophorectomy prior to the breast cancer diagnosis 5.flu vaccine done  Time spent 15 min including >50% counseling and coordination of care.   Katie Carrillo P, MD   02/28/2014, 2:05 PM

## 2014-03-02 DIAGNOSIS — R922 Inconclusive mammogram: Secondary | ICD-10-CM | POA: Insufficient documentation

## 2014-03-14 DIAGNOSIS — L309 Dermatitis, unspecified: Secondary | ICD-10-CM | POA: Diagnosis not present

## 2014-03-14 DIAGNOSIS — Z0000 Encounter for general adult medical examination without abnormal findings: Secondary | ICD-10-CM | POA: Diagnosis not present

## 2014-03-14 DIAGNOSIS — I10 Essential (primary) hypertension: Secondary | ICD-10-CM | POA: Diagnosis not present

## 2014-03-14 DIAGNOSIS — R300 Dysuria: Secondary | ICD-10-CM | POA: Diagnosis not present

## 2014-03-14 DIAGNOSIS — E785 Hyperlipidemia, unspecified: Secondary | ICD-10-CM | POA: Diagnosis not present

## 2014-03-14 DIAGNOSIS — Z01818 Encounter for other preprocedural examination: Secondary | ICD-10-CM | POA: Diagnosis not present

## 2014-03-18 DIAGNOSIS — H02403 Unspecified ptosis of bilateral eyelids: Secondary | ICD-10-CM | POA: Diagnosis not present

## 2014-04-25 DIAGNOSIS — N819 Female genital prolapse, unspecified: Secondary | ICD-10-CM | POA: Diagnosis not present

## 2014-04-25 DIAGNOSIS — R350 Frequency of micturition: Secondary | ICD-10-CM | POA: Diagnosis not present

## 2014-04-25 DIAGNOSIS — N898 Other specified noninflammatory disorders of vagina: Secondary | ICD-10-CM | POA: Diagnosis not present

## 2014-05-21 ENCOUNTER — Other Ambulatory Visit: Payer: Self-pay | Admitting: Obstetrics & Gynecology

## 2014-05-21 DIAGNOSIS — N898 Other specified noninflammatory disorders of vagina: Secondary | ICD-10-CM | POA: Diagnosis not present

## 2014-06-17 DIAGNOSIS — L119 Acantholytic disorder, unspecified: Secondary | ICD-10-CM | POA: Diagnosis not present

## 2014-09-12 DIAGNOSIS — Z23 Encounter for immunization: Secondary | ICD-10-CM | POA: Diagnosis not present

## 2014-09-12 DIAGNOSIS — R251 Tremor, unspecified: Secondary | ICD-10-CM | POA: Diagnosis not present

## 2014-09-12 DIAGNOSIS — G3184 Mild cognitive impairment, so stated: Secondary | ICD-10-CM | POA: Diagnosis not present

## 2014-09-12 DIAGNOSIS — I10 Essential (primary) hypertension: Secondary | ICD-10-CM | POA: Diagnosis not present

## 2014-12-16 DIAGNOSIS — G3184 Mild cognitive impairment, so stated: Secondary | ICD-10-CM | POA: Diagnosis not present

## 2014-12-16 DIAGNOSIS — M545 Low back pain: Secondary | ICD-10-CM | POA: Diagnosis not present

## 2014-12-16 DIAGNOSIS — R251 Tremor, unspecified: Secondary | ICD-10-CM | POA: Diagnosis not present

## 2014-12-18 ENCOUNTER — Telehealth: Payer: Self-pay | Admitting: Oncology

## 2014-12-18 NOTE — Telephone Encounter (Signed)
Called and left a message with 02/2015 appointments °

## 2015-01-14 DIAGNOSIS — I10 Essential (primary) hypertension: Secondary | ICD-10-CM | POA: Diagnosis not present

## 2015-01-14 DIAGNOSIS — M545 Low back pain: Secondary | ICD-10-CM | POA: Diagnosis not present

## 2015-01-14 DIAGNOSIS — J00 Acute nasopharyngitis [common cold]: Secondary | ICD-10-CM | POA: Diagnosis not present

## 2015-01-14 DIAGNOSIS — R05 Cough: Secondary | ICD-10-CM | POA: Diagnosis not present

## 2015-01-14 DIAGNOSIS — R197 Diarrhea, unspecified: Secondary | ICD-10-CM | POA: Diagnosis not present

## 2015-02-04 ENCOUNTER — Ambulatory Visit
Admission: RE | Admit: 2015-02-04 | Discharge: 2015-02-04 | Disposition: A | Discharge status: Home / Self Care | Payer: Medicare Other | Source: Ambulatory Visit | Attending: Oncology | Admitting: Oncology

## 2015-02-04 DIAGNOSIS — Z1231 Encounter for screening mammogram for malignant neoplasm of breast: Secondary | ICD-10-CM

## 2015-02-26 ENCOUNTER — Other Ambulatory Visit: Payer: Self-pay | Admitting: Oncology

## 2015-02-26 ENCOUNTER — Telehealth: Payer: Self-pay | Admitting: Oncology

## 2015-02-26 NOTE — Telephone Encounter (Signed)
Called patient per pof from dr Marko Plume and she request not to see kristin and will come in another day.   done

## 2015-03-03 ENCOUNTER — Ambulatory Visit: Payer: Medicare Other | Admitting: Oncology

## 2015-03-03 ENCOUNTER — Other Ambulatory Visit: Payer: Medicare Other

## 2015-03-05 DIAGNOSIS — L821 Other seborrheic keratosis: Secondary | ICD-10-CM | POA: Diagnosis not present

## 2015-03-05 DIAGNOSIS — D485 Neoplasm of uncertain behavior of skin: Secondary | ICD-10-CM | POA: Diagnosis not present

## 2015-03-05 DIAGNOSIS — D225 Melanocytic nevi of trunk: Secondary | ICD-10-CM | POA: Diagnosis not present

## 2015-03-05 DIAGNOSIS — Z23 Encounter for immunization: Secondary | ICD-10-CM | POA: Diagnosis not present

## 2015-03-05 DIAGNOSIS — Z85828 Personal history of other malignant neoplasm of skin: Secondary | ICD-10-CM | POA: Diagnosis not present

## 2015-03-05 DIAGNOSIS — L570 Actinic keratosis: Secondary | ICD-10-CM | POA: Diagnosis not present

## 2015-03-05 DIAGNOSIS — L814 Other melanin hyperpigmentation: Secondary | ICD-10-CM | POA: Diagnosis not present

## 2015-03-05 DIAGNOSIS — D1800 Hemangioma unspecified site: Secondary | ICD-10-CM | POA: Diagnosis not present

## 2015-03-06 DIAGNOSIS — L739 Follicular disorder, unspecified: Secondary | ICD-10-CM | POA: Diagnosis not present

## 2015-03-19 ENCOUNTER — Telehealth: Payer: Self-pay | Admitting: Oncology

## 2015-03-19 ENCOUNTER — Other Ambulatory Visit: Payer: Self-pay | Admitting: Oncology

## 2015-03-19 DIAGNOSIS — D0512 Intraductal carcinoma in situ of left breast: Secondary | ICD-10-CM

## 2015-03-19 NOTE — Telephone Encounter (Signed)
Per LL nurse patient called after hours line on 2/14 to cancel 2/16 appt. Called patient 2/15 to see if she would like to r/s but she would call back at a later date to do so

## 2015-03-20 ENCOUNTER — Other Ambulatory Visit: Payer: Medicare Other

## 2015-03-20 ENCOUNTER — Ambulatory Visit: Payer: Medicare Other | Admitting: Oncology

## 2015-03-20 ENCOUNTER — Telehealth: Payer: Self-pay | Admitting: Oncology

## 2015-03-20 NOTE — Telephone Encounter (Signed)
Patient left message requesting that 2/16 appointments be cxd and she would call back to r/s. Appointments already cxd.

## 2015-04-01 DIAGNOSIS — R3915 Urgency of urination: Secondary | ICD-10-CM | POA: Diagnosis not present

## 2015-04-01 DIAGNOSIS — I10 Essential (primary) hypertension: Secondary | ICD-10-CM | POA: Diagnosis not present

## 2015-04-01 DIAGNOSIS — G3184 Mild cognitive impairment, so stated: Secondary | ICD-10-CM | POA: Diagnosis not present

## 2015-04-01 DIAGNOSIS — R251 Tremor, unspecified: Secondary | ICD-10-CM | POA: Diagnosis not present

## 2015-04-01 DIAGNOSIS — Z0000 Encounter for general adult medical examination without abnormal findings: Secondary | ICD-10-CM | POA: Diagnosis not present

## 2015-04-01 DIAGNOSIS — E785 Hyperlipidemia, unspecified: Secondary | ICD-10-CM | POA: Diagnosis not present

## 2015-05-05 DIAGNOSIS — Z961 Presence of intraocular lens: Secondary | ICD-10-CM | POA: Diagnosis not present

## 2015-10-22 DIAGNOSIS — Z23 Encounter for immunization: Secondary | ICD-10-CM | POA: Diagnosis not present

## 2015-10-30 ENCOUNTER — Other Ambulatory Visit: Payer: Self-pay | Admitting: Family Medicine

## 2015-10-30 DIAGNOSIS — Z1231 Encounter for screening mammogram for malignant neoplasm of breast: Secondary | ICD-10-CM

## 2015-11-07 DIAGNOSIS — D485 Neoplasm of uncertain behavior of skin: Secondary | ICD-10-CM | POA: Diagnosis not present

## 2015-11-07 DIAGNOSIS — K5792 Diverticulitis of intestine, part unspecified, without perforation or abscess without bleeding: Secondary | ICD-10-CM | POA: Diagnosis not present

## 2015-11-07 DIAGNOSIS — R300 Dysuria: Secondary | ICD-10-CM | POA: Diagnosis not present

## 2015-11-21 ENCOUNTER — Other Ambulatory Visit: Payer: Self-pay | Admitting: Family Medicine

## 2015-11-21 DIAGNOSIS — B078 Other viral warts: Secondary | ICD-10-CM | POA: Diagnosis not present

## 2015-11-21 DIAGNOSIS — K5792 Diverticulitis of intestine, part unspecified, without perforation or abscess without bleeding: Secondary | ICD-10-CM | POA: Diagnosis not present

## 2015-11-21 DIAGNOSIS — Z09 Encounter for follow-up examination after completed treatment for conditions other than malignant neoplasm: Secondary | ICD-10-CM | POA: Diagnosis not present

## 2015-12-03 DIAGNOSIS — B373 Candidiasis of vulva and vagina: Secondary | ICD-10-CM | POA: Diagnosis not present

## 2015-12-07 DIAGNOSIS — L03019 Cellulitis of unspecified finger: Secondary | ICD-10-CM | POA: Diagnosis not present

## 2016-01-06 DIAGNOSIS — L03317 Cellulitis of buttock: Secondary | ICD-10-CM | POA: Diagnosis not present

## 2016-01-06 DIAGNOSIS — N764 Abscess of vulva: Secondary | ICD-10-CM | POA: Diagnosis not present

## 2016-01-06 DIAGNOSIS — N762 Acute vulvitis: Secondary | ICD-10-CM | POA: Diagnosis not present

## 2016-01-10 DIAGNOSIS — Z4800 Encounter for change or removal of nonsurgical wound dressing: Secondary | ICD-10-CM | POA: Diagnosis not present

## 2016-01-10 DIAGNOSIS — I10 Essential (primary) hypertension: Secondary | ICD-10-CM | POA: Diagnosis not present

## 2016-01-10 DIAGNOSIS — Z853 Personal history of malignant neoplasm of breast: Secondary | ICD-10-CM | POA: Diagnosis not present

## 2016-01-10 DIAGNOSIS — K219 Gastro-esophageal reflux disease without esophagitis: Secondary | ICD-10-CM | POA: Diagnosis not present

## 2016-01-10 DIAGNOSIS — G3184 Mild cognitive impairment, so stated: Secondary | ICD-10-CM | POA: Diagnosis not present

## 2016-01-10 DIAGNOSIS — E669 Obesity, unspecified: Secondary | ICD-10-CM | POA: Diagnosis not present

## 2016-01-10 DIAGNOSIS — N764 Abscess of vulva: Secondary | ICD-10-CM | POA: Diagnosis not present

## 2016-01-12 DIAGNOSIS — I10 Essential (primary) hypertension: Secondary | ICD-10-CM | POA: Diagnosis not present

## 2016-01-12 DIAGNOSIS — K219 Gastro-esophageal reflux disease without esophagitis: Secondary | ICD-10-CM | POA: Diagnosis not present

## 2016-01-12 DIAGNOSIS — E669 Obesity, unspecified: Secondary | ICD-10-CM | POA: Diagnosis not present

## 2016-01-12 DIAGNOSIS — G3184 Mild cognitive impairment, so stated: Secondary | ICD-10-CM | POA: Diagnosis not present

## 2016-01-12 DIAGNOSIS — N764 Abscess of vulva: Secondary | ICD-10-CM | POA: Diagnosis not present

## 2016-01-12 DIAGNOSIS — Z4800 Encounter for change or removal of nonsurgical wound dressing: Secondary | ICD-10-CM | POA: Diagnosis not present

## 2016-01-13 DIAGNOSIS — I10 Essential (primary) hypertension: Secondary | ICD-10-CM | POA: Diagnosis not present

## 2016-01-13 DIAGNOSIS — G3184 Mild cognitive impairment, so stated: Secondary | ICD-10-CM | POA: Diagnosis not present

## 2016-01-13 DIAGNOSIS — K219 Gastro-esophageal reflux disease without esophagitis: Secondary | ICD-10-CM | POA: Diagnosis not present

## 2016-01-13 DIAGNOSIS — N764 Abscess of vulva: Secondary | ICD-10-CM | POA: Diagnosis not present

## 2016-01-13 DIAGNOSIS — Z4800 Encounter for change or removal of nonsurgical wound dressing: Secondary | ICD-10-CM | POA: Diagnosis not present

## 2016-01-13 DIAGNOSIS — E669 Obesity, unspecified: Secondary | ICD-10-CM | POA: Diagnosis not present

## 2016-01-15 DIAGNOSIS — Z4800 Encounter for change or removal of nonsurgical wound dressing: Secondary | ICD-10-CM | POA: Diagnosis not present

## 2016-01-15 DIAGNOSIS — K219 Gastro-esophageal reflux disease without esophagitis: Secondary | ICD-10-CM | POA: Diagnosis not present

## 2016-01-15 DIAGNOSIS — G3184 Mild cognitive impairment, so stated: Secondary | ICD-10-CM | POA: Diagnosis not present

## 2016-01-15 DIAGNOSIS — N764 Abscess of vulva: Secondary | ICD-10-CM | POA: Diagnosis not present

## 2016-01-15 DIAGNOSIS — I10 Essential (primary) hypertension: Secondary | ICD-10-CM | POA: Diagnosis not present

## 2016-01-15 DIAGNOSIS — E669 Obesity, unspecified: Secondary | ICD-10-CM | POA: Diagnosis not present

## 2016-01-19 DIAGNOSIS — N764 Abscess of vulva: Secondary | ICD-10-CM | POA: Diagnosis not present

## 2016-01-19 DIAGNOSIS — N7689 Other specified inflammation of vagina and vulva: Secondary | ICD-10-CM | POA: Diagnosis not present

## 2016-02-06 ENCOUNTER — Ambulatory Visit
Admission: RE | Admit: 2016-02-06 | Discharge: 2016-02-06 | Disposition: A | Discharge status: Home / Self Care | Payer: Medicare Other | Source: Ambulatory Visit | Attending: Family Medicine | Admitting: Family Medicine

## 2016-02-06 DIAGNOSIS — Z1231 Encounter for screening mammogram for malignant neoplasm of breast: Secondary | ICD-10-CM

## 2016-04-16 DIAGNOSIS — Z01419 Encounter for gynecological examination (general) (routine) without abnormal findings: Secondary | ICD-10-CM | POA: Diagnosis not present

## 2016-04-16 DIAGNOSIS — D229 Melanocytic nevi, unspecified: Secondary | ICD-10-CM | POA: Diagnosis not present

## 2016-04-19 DIAGNOSIS — N764 Abscess of vulva: Secondary | ICD-10-CM | POA: Diagnosis not present

## 2016-04-20 DIAGNOSIS — N764 Abscess of vulva: Secondary | ICD-10-CM | POA: Diagnosis not present

## 2016-04-22 DIAGNOSIS — Z0000 Encounter for general adult medical examination without abnormal findings: Secondary | ICD-10-CM | POA: Diagnosis not present

## 2016-04-22 DIAGNOSIS — I10 Essential (primary) hypertension: Secondary | ICD-10-CM | POA: Diagnosis not present

## 2016-04-22 DIAGNOSIS — G3184 Mild cognitive impairment, so stated: Secondary | ICD-10-CM | POA: Diagnosis not present

## 2016-04-22 DIAGNOSIS — R3915 Urgency of urination: Secondary | ICD-10-CM | POA: Diagnosis not present

## 2016-04-22 DIAGNOSIS — E785 Hyperlipidemia, unspecified: Secondary | ICD-10-CM | POA: Diagnosis not present

## 2016-05-04 DIAGNOSIS — N764 Abscess of vulva: Secondary | ICD-10-CM | POA: Diagnosis not present

## 2016-05-14 DIAGNOSIS — L700 Acne vulgaris: Secondary | ICD-10-CM | POA: Diagnosis not present

## 2016-05-14 DIAGNOSIS — L821 Other seborrheic keratosis: Secondary | ICD-10-CM | POA: Diagnosis not present

## 2016-05-14 DIAGNOSIS — Z85828 Personal history of other malignant neoplasm of skin: Secondary | ICD-10-CM | POA: Diagnosis not present

## 2016-06-16 ENCOUNTER — Emergency Department (HOSPITAL_BASED_OUTPATIENT_CLINIC_OR_DEPARTMENT_OTHER): Payer: Medicare Other

## 2016-06-16 ENCOUNTER — Encounter (HOSPITAL_BASED_OUTPATIENT_CLINIC_OR_DEPARTMENT_OTHER): Payer: Self-pay | Admitting: *Deleted

## 2016-06-16 ENCOUNTER — Emergency Department (HOSPITAL_BASED_OUTPATIENT_CLINIC_OR_DEPARTMENT_OTHER)
Admission: EM | Admit: 2016-06-16 | Discharge: 2016-06-16 | Disposition: A | Discharge status: Home / Self Care | Payer: Medicare Other | Attending: Physician Assistant | Admitting: Physician Assistant

## 2016-06-16 DIAGNOSIS — Z79899 Other long term (current) drug therapy: Secondary | ICD-10-CM | POA: Diagnosis not present

## 2016-06-16 DIAGNOSIS — N200 Calculus of kidney: Secondary | ICD-10-CM | POA: Diagnosis not present

## 2016-06-16 DIAGNOSIS — R109 Unspecified abdominal pain: Secondary | ICD-10-CM | POA: Diagnosis present

## 2016-06-16 DIAGNOSIS — M47816 Spondylosis without myelopathy or radiculopathy, lumbar region: Secondary | ICD-10-CM | POA: Diagnosis not present

## 2016-06-16 DIAGNOSIS — I10 Essential (primary) hypertension: Secondary | ICD-10-CM | POA: Diagnosis not present

## 2016-06-16 LAB — URINALYSIS, ROUTINE W REFLEX MICROSCOPIC
BILIRUBIN URINE: NEGATIVE
Glucose, UA: NEGATIVE mg/dL
HGB URINE DIPSTICK: NEGATIVE
KETONES UR: NEGATIVE mg/dL
Leukocytes, UA: NEGATIVE
Nitrite: NEGATIVE
Protein, ur: NEGATIVE mg/dL
Specific Gravity, Urine: 1.009 (ref 1.005–1.030)
pH: 5.5 (ref 5.0–8.0)

## 2016-06-16 MED ORDER — OXYCODONE-ACETAMINOPHEN 5-325 MG PO TABS
1.0000 | ORAL_TABLET | Freq: Once | ORAL | Status: AC
  Administered 2016-06-16: 1 via ORAL

## 2016-06-16 MED ORDER — OXYCODONE-ACETAMINOPHEN 5-325 MG PO TABS
ORAL_TABLET | ORAL | Status: AC
  Administered 2016-06-16: 1 via ORAL
  Filled 2016-06-16: qty 1

## 2016-06-16 MED ORDER — TAMSULOSIN HCL 0.4 MG PO CAPS: 0.4000 mg | ORAL_CAPSULE | Freq: Every day | ORAL | 0 refills | Status: DC

## 2016-06-16 MED ORDER — OXYCODONE-ACETAMINOPHEN 5-325 MG PO TABS: 1.0000 | ORAL_TABLET | Freq: Four times a day (QID) | ORAL | 0 refills | Status: DC | PRN

## 2016-06-16 NOTE — ED Triage Notes (Signed)
Let flank pain x 2 weeks.  States that she had some leftover antibiotics that she took without relief.

## 2016-06-16 NOTE — Discharge Instructions (Signed)
Please return with any concenrs. Please follow up with urology and your primary care.

## 2016-06-16 NOTE — ED Provider Notes (Signed)
[place] DEPT [facility] Provider Note   CSN: [id number] Arrival date & time: 06/16/16  1814   By signing my name below, I, Katie Carrillo, attest that this documentation has been prepared under the direction and in the presence of Katie Carrillo, Fredia Sorrow, MD. Electronically Signed: Theresia Carrillo, ED Scribe. 06/16/16. 7:47 PM.   History   Chief Complaint Chief Complaint  Patient presents with  . Flank Pain   The history is provided by the patient. No language interpreter was used.   HPI Comments: Katie Carrillo is a 76 y.o. female who presents to the Emergency Department complaining of gradually worsening, intermittent left flank pain onset 2 weeks ago. She describes her pain as radiating from her left hip to her mid back. Pt states she took antibiotics that were left over from a previous infection and Tylenol with minimal relief. Pt states her pain is exacerbated by movement. Pt denies hx of kidney stones. No other complaints at this time.  Past Medical History:  Diagnosis Date  . Cancer [facility])    breast      lumpectomy   lt  . DEMENTIA   . Hypertension    dr Loney Hering    [employer]  . Urinary urgency     Patient Active Problem List   Diagnosis Date Noted  . Dense breast tissue 03/02/2014  . DCIS (ductal carcinoma in situ) of breast 09/30/2011  . Osteoarthritis of right knee 07/02/2011    Past Surgical History:  Procedure Laterality Date  . ABDOMINAL HYSTERECTOMY    . BREAST SURGERY     lt  . hemmoriods    . TOTAL KNEE ARTHROPLASTY  06/30/2011   Procedure: TOTAL KNEE ARTHROPLASTY;  Surgeon: Kerin Salen, MD;  Location: [place];  Service: Orthopedics;  Laterality: Right;    OB History    No data available       Home Medications    Prior to Admission medications   Medication Sig Start Date End Date Taking? Authorizing Provider  donepezil (ARICEPT) 10 MG tablet Take 10 mg by mouth Daily. 09/17/11   [provider]  lisinopril-hydrochlorothiazide  (PRINZIDE,ZESTORETIC) 10-12.5 MG per tablet 10-12.5 mg. 08/30/11   [provider]  Multiple Vitamin (MULITIVITAMIN WITH MINERALS) TABS Take 1 tablet by mouth daily.    [provider]  Omega-3 Fatty Acids (FISH OIL) 600 MG CAPS Take 1 tablet by mouth 2 (two) times daily.    [provider]  oxybutynin (DITROPAN) 5 MG tablet Take 5 mg by mouth Twice daily. 09/27/11   [provider]  Red Yeast Rice 600 MG CAPS Take 2 capsules by mouth every evening.     [provider]    Family History History reviewed. No pertinent family history.  Social History Social History  Substance Use Topics  . Smoking status: Never Smoker  . Smokeless tobacco: Not on file  . Alcohol use No     Allergies   Morphine and related   Review of Systems Review of Systems  Genitourinary: Positive for flank pain.  Musculoskeletal: Positive for back pain and myalgias.  All other systems reviewed and are negative.    Physical Exam Updated Vital Signs BP (!) 146/65 (BP Location: Left Arm)   Pulse (!) 54   Temp 98.9 F (37.2 C) (Oral)   Resp 18   Ht 5\' 8"  (1.727 m)   Wt 210 lb (95.3 kg)   SpO2 97%   BMI 31.93 kg/m   Physical Exam  Constitutional:  She is oriented to person, place, and time. She appears well-developed and well-nourished.  HENT:  Head: Normocephalic and atraumatic.  Cardiovascular: Normal rate.   Pulmonary/Chest: Effort normal.  Abdominal: Soft. She exhibits no distension. There is no guarding.  Musculoskeletal: Normal range of motion. She exhibits tenderness.  Pain to palpation to the lateral left hip, left groin and left lumbar musculature. Can ambulate normally.   Neurological: She is alert and oriented to person, place, and time.  Skin: Skin is warm and dry.  Psychiatric: She has a normal mood and affect.  Nursing note and vitals reviewed.    ED Treatments / Results  DIAGNOSTIC STUDIES: Oxygen Saturation is 97% on RA, normal by my  interpretation.   COORDINATION OF CARE: 7:43 PM-Discussed next steps with pt including UA, XR and pain management with Tylenol and muscle relaxants. Pt verbalized understanding and is agreeable with the plan.    Labs (all labs ordered are listed, but only abnormal results are displayed) Labs Reviewed  URINALYSIS, ROUTINE W REFLEX MICROSCOPIC    EKG  EKG Interpretation None       Radiology No results found.  Procedures Procedures (including critical care time)  Medications Ordered in ED Medications - No data to display   Initial Impression / Assessment and Plan / ED Course  I have reviewed the triage vital signs and the nursing notes.  Pertinent labs & imaging results that were available during my care of the patient were reviewed by me and considered in my medical decision making (see chart for details).    I personally performed the services described in this documentation, which was scribed in my presence. The recorded information has been reviewed and is accurate.   Patient is 76 year old female presenting with left flank pain radiating to the left groin. Patient's never had a history of kidney stones. Urine is negative for infection. Patient says it's worse with movement. X-ray shows small stone. I think this is likely secondary to kidney stone pain. Will have follow-up with urology.    Final Clinical Impressions(s) / ED Diagnoses   Final diagnoses:  None    New Prescriptions New Prescriptions   No medications on file     Macarthur Critchley, MD 06/16/16 2209

## 2016-07-23 DIAGNOSIS — G3184 Mild cognitive impairment, so stated: Secondary | ICD-10-CM | POA: Diagnosis not present

## 2016-07-23 DIAGNOSIS — R3915 Urgency of urination: Secondary | ICD-10-CM | POA: Diagnosis not present

## 2016-07-23 DIAGNOSIS — R251 Tremor, unspecified: Secondary | ICD-10-CM | POA: Diagnosis not present

## 2016-07-23 DIAGNOSIS — I10 Essential (primary) hypertension: Secondary | ICD-10-CM | POA: Diagnosis not present

## 2016-10-20 DIAGNOSIS — Z23 Encounter for immunization: Secondary | ICD-10-CM | POA: Diagnosis not present

## 2016-10-20 DIAGNOSIS — R3915 Urgency of urination: Secondary | ICD-10-CM | POA: Diagnosis not present

## 2016-10-20 DIAGNOSIS — G3184 Mild cognitive impairment, so stated: Secondary | ICD-10-CM | POA: Diagnosis not present

## 2016-10-25 DIAGNOSIS — Z961 Presence of intraocular lens: Secondary | ICD-10-CM | POA: Diagnosis not present

## 2016-11-09 DIAGNOSIS — K621 Rectal polyp: Secondary | ICD-10-CM | POA: Diagnosis not present

## 2016-11-09 DIAGNOSIS — Z86010 Personal history of colonic polyps: Secondary | ICD-10-CM | POA: Diagnosis not present

## 2016-11-09 DIAGNOSIS — D126 Benign neoplasm of colon, unspecified: Secondary | ICD-10-CM | POA: Diagnosis not present

## 2016-11-11 DIAGNOSIS — D126 Benign neoplasm of colon, unspecified: Secondary | ICD-10-CM | POA: Diagnosis not present

## 2017-01-03 ENCOUNTER — Other Ambulatory Visit: Payer: Self-pay | Admitting: Family Medicine

## 2017-01-03 DIAGNOSIS — Z1231 Encounter for screening mammogram for malignant neoplasm of breast: Secondary | ICD-10-CM

## 2017-02-07 ENCOUNTER — Ambulatory Visit
Admission: RE | Admit: 2017-02-07 | Discharge: 2017-02-07 | Disposition: A | Discharge status: Home / Self Care | Payer: Medicare Other | Source: Ambulatory Visit | Attending: Family Medicine | Admitting: Family Medicine

## 2017-02-07 DIAGNOSIS — Z1231 Encounter for screening mammogram for malignant neoplasm of breast: Secondary | ICD-10-CM | POA: Diagnosis not present

## 2017-02-07 HISTORY — DX: Personal history of irradiation: Z92.3

## 2017-02-16 DIAGNOSIS — M62838 Other muscle spasm: Secondary | ICD-10-CM | POA: Diagnosis not present

## 2017-02-16 DIAGNOSIS — R350 Frequency of micturition: Secondary | ICD-10-CM | POA: Diagnosis not present

## 2017-03-14 ENCOUNTER — Emergency Department (HOSPITAL_BASED_OUTPATIENT_CLINIC_OR_DEPARTMENT_OTHER): Payer: Medicare Other

## 2017-03-14 ENCOUNTER — Other Ambulatory Visit: Payer: Self-pay

## 2017-03-14 ENCOUNTER — Emergency Department (HOSPITAL_BASED_OUTPATIENT_CLINIC_OR_DEPARTMENT_OTHER)
Admission: EM | Admit: 2017-03-14 | Discharge: 2017-03-14 | Disposition: A | Discharge status: Home / Self Care | Payer: Medicare Other | Attending: Emergency Medicine | Admitting: Emergency Medicine

## 2017-03-14 ENCOUNTER — Encounter (HOSPITAL_BASED_OUTPATIENT_CLINIC_OR_DEPARTMENT_OTHER): Payer: Self-pay | Admitting: *Deleted

## 2017-03-14 DIAGNOSIS — Z853 Personal history of malignant neoplasm of breast: Secondary | ICD-10-CM | POA: Insufficient documentation

## 2017-03-14 DIAGNOSIS — M25512 Pain in left shoulder: Secondary | ICD-10-CM | POA: Diagnosis not present

## 2017-03-14 DIAGNOSIS — Z96651 Presence of right artificial knee joint: Secondary | ICD-10-CM | POA: Diagnosis not present

## 2017-03-14 DIAGNOSIS — R079 Chest pain, unspecified: Secondary | ICD-10-CM | POA: Diagnosis not present

## 2017-03-14 DIAGNOSIS — F0390 Unspecified dementia without behavioral disturbance: Secondary | ICD-10-CM | POA: Insufficient documentation

## 2017-03-14 DIAGNOSIS — Z79899 Other long term (current) drug therapy: Secondary | ICD-10-CM | POA: Insufficient documentation

## 2017-03-14 DIAGNOSIS — M5442 Lumbago with sciatica, left side: Secondary | ICD-10-CM | POA: Diagnosis not present

## 2017-03-14 DIAGNOSIS — I10 Essential (primary) hypertension: Secondary | ICD-10-CM | POA: Insufficient documentation

## 2017-03-14 DIAGNOSIS — M7582 Other shoulder lesions, left shoulder: Secondary | ICD-10-CM | POA: Insufficient documentation

## 2017-03-14 DIAGNOSIS — M545 Low back pain: Secondary | ICD-10-CM | POA: Diagnosis not present

## 2017-03-14 MED ORDER — MELOXICAM 7.5 MG PO TABS: 7.5000 mg | ORAL_TABLET | Freq: Every day | ORAL | 0 refills | Status: DC

## 2017-03-14 MED ORDER — LIDOCAINE 5 % EX PTCH: 1.0000 | MEDICATED_PATCH | CUTANEOUS | 0 refills | Status: DC

## 2017-03-14 MED ORDER — TRAMADOL HCL 50 MG PO TABS: 50.0000 mg | ORAL_TABLET | Freq: Two times a day (BID) | ORAL | 0 refills | Status: DC | PRN

## 2017-03-14 NOTE — ED Triage Notes (Addendum)
Pt reports low back pain and left shoulder pain x 1 month, seen by her pcp, rx "a rub cream". States it helped initially but now pain is worse. Denies any injury or trauma

## 2017-03-14 NOTE — Discharge Instructions (Signed)
Apply lidocaine patch to painful areas as needed each day, or night. Tramadol for pain as needed at night if unable to sleep. Mobic once per day only. Call [phone] for outpatient rehab/physical therapy at [facility] and [facility] to make arrangement for physical therapy evaluation

## 2017-03-14 NOTE — ED Provider Notes (Signed)
[name] EMERGENCY DEPARTMENT Provider Note   CSN: [id number] Arrival date & time: 03/14/17  1109     History   Chief Complaint Chief Complaint  Patient presents with  . Back Pain    HPI Katie Carrillo is a 77 y.o. female. M chief complaint is left shoulder, and left hip pain.  HPI Katie Carrillo is 60.  She has a history of breast cancer over 10 years ago thought to still be in remission.  She underwent lumpectomy and radiation treatment to both breasts.  She had pain in her left shoulder, and pain in her left hip for the last 2 weeks.  She saw her doctor and was given a prescription for Voltaren cream.  It helps at times but has not "resolved".  She has been helping and "76 year old lady" that goes to her church do her ADLs for the last 1 month and notices that she is painful when she attempts to use her arm to lift, or carry things.  No headache.  No right-sided symptoms.  No weakness or numbness.  No chest pain or difficulty breathing.  No fall, injury, or trauma.  Past Medical History:  Diagnosis Date  . Cancer [facility])    breast      lumpectomy   lt  . Dementia   . Hypertension    dr Loney Hering    [employer]  . Personal history of radiation therapy 2008  . Urinary urgency     Patient Active Problem List   Diagnosis Date Noted  . Dense breast tissue 03/02/2014  . DCIS (ductal carcinoma in situ) of breast 09/30/2011  . Osteoarthritis of right knee 07/02/2011    Past Surgical History:  Procedure Laterality Date  . ABDOMINAL HYSTERECTOMY    . BREAST BIOPSY Left 01/2006  . BREAST EXCISIONAL BIOPSY Right   . BREAST LUMPECTOMY Left 02/2006  . BREAST SURGERY     lt  . hemmoriods    . TOTAL KNEE ARTHROPLASTY  06/30/2011   Procedure: TOTAL KNEE ARTHROPLASTY;  Surgeon: Kerin Salen, MD;  Location: [place];  Service: Orthopedics;  Laterality: Right;    OB History    No data available       Home Medications    Prior to Admission medications   Medication Sig  Start Date End Date Taking? Authorizing Provider  donepezil (ARICEPT) 10 MG tablet Take 10 mg by mouth Daily. 09/17/11   [provider]  lidocaine (LIDODERM) 5 % Place 1 patch onto the skin daily. Apply to painful area for 12 hours each day or night as needed. 03/14/17   Tanna Furry, MD  lisinopril-hydrochlorothiazide (PRINZIDE,ZESTORETIC) 10-12.5 MG per tablet 10-12.5 mg. 08/30/11   [provider]  meloxicam (MOBIC) 7.5 MG tablet Take 1 tablet (7.5 mg total) by mouth daily. 03/14/17   Tanna Furry, MD  Multiple Vitamin (MULITIVITAMIN WITH MINERALS) TABS Take 1 tablet by mouth daily.    [provider]  Omega-3 Fatty Acids (FISH OIL) 600 MG CAPS Take 1 tablet by mouth 2 (two) times daily.    [provider]  oxybutynin (DITROPAN) 5 MG tablet Take 5 mg by mouth Twice daily. 09/27/11   [provider]  oxyCODONE-acetaminophen (PERCOCET/ROXICET) 5-325 MG tablet Take 1 tablet by mouth every 6 (six) hours as needed for severe pain. 06/16/16   Mackuen, Courteney Lyn, MD  Red Yeast Rice 600 MG CAPS Take 2 capsules by mouth every evening.     [provider]  tamsulosin [facility])  0.4 MG CAPS capsule Take 1 capsule (0.4 mg total) by mouth daily. 06/16/16   Mackuen, Courteney Lyn, MD  traMADol (ULTRAM) 50 MG tablet Take 1 tablet (50 mg total) by mouth every 12 (twelve) hours as needed for moderate pain. 03/14/17   Tanna Furry, MD    Family History History reviewed. No pertinent family history.  Social History Social History   Tobacco Use  . Smoking status: Never Smoker  . Smokeless tobacco: Never Used  Substance Use Topics  . Alcohol use: No  . Drug use: No     Allergies   Morphine and related   Review of Systems Review of Systems  Constitutional: Negative for appetite change, chills, diaphoresis, fatigue and fever.  HENT: Negative for mouth sores, sore throat and trouble swallowing.   Eyes: Negative for visual disturbance.  Respiratory:  Negative for cough, chest tightness, shortness of breath and wheezing.   Cardiovascular: Negative for chest pain.  Gastrointestinal: Negative for abdominal distention, abdominal pain, diarrhea, nausea and vomiting.  Endocrine: Negative for polydipsia, polyphagia and polyuria.  Genitourinary: Negative for dysuria, frequency and hematuria.  Musculoskeletal: Positive for arthralgias, back pain and myalgias. Negative for gait problem.  Skin: Negative for color change, pallor and rash.  Neurological: Negative for dizziness, syncope, light-headedness and headaches.  Hematological: Does not bruise/bleed easily.  Psychiatric/Behavioral: Negative for behavioral problems and confusion.     Physical Exam Updated Vital Signs Ht 5\' 8"  (1.727 m)   Wt 95.3 kg (210 lb)   BMI 31.93 kg/m   Physical Exam  Constitutional: She is oriented to person, place, and time. She appears well-developed and well-nourished. No distress.  HENT:  Head: Normocephalic.  Eyes: Conjunctivae are normal. Pupils are equal, round, and reactive to light. No scleral icterus.  Neck: Normal range of motion. Neck supple. No thyromegaly present.  Cardiovascular: Normal rate and regular rhythm. Exam reveals no gallop and no friction rub.  No murmur heard. Pulmonary/Chest: Effort normal and breath sounds normal. No respiratory distress. She has no wheezes. She has no rales.  Abdominal: Soft. Bowel sounds are normal. She exhibits no distension. There is no tenderness. There is no rebound.  Musculoskeletal: Normal range of motion.  Tenderness in left supraspinatus fossa to the tip of the acromion.  Normal neuro exam of the upper extremity.  She has painful arc starting at 30 degrees with the shoulder.  Tender in the left sciatic notch.  Not tender directly over the trochanteric bursa.  No radicular symptoms.  Normal strength, gait, and reflexes to the bilateral lower extremities.  Neurological: She is alert and oriented to person,  place, and time.  Skin: Skin is warm and dry. No rash noted.  Psychiatric: She has a normal mood and affect. Her behavior is normal.     ED Treatments / Results  Labs (all labs ordered are listed, but only abnormal results are displayed) Labs Reviewed - No data to display  EKG  EKG Interpretation None       Radiology Dg Chest 1 View  Result Date: 03/14/2017 CLINICAL DATA:  Left shoulder pain. EXAM: CHEST 1 VIEW COMPARISON:  10/30/2013 FINDINGS: The heart size and mediastinal contours are within normal limits. Both lungs are clear. The visualized skeletal structures are unremarkable. Aortic atherosclerosis. IMPRESSION: No acute abnormality. Aortic Atherosclerosis (ICD10-I70.0). Electronically Signed   By: Lorriane Shire M.D.   On: 03/14/2017 12:14   Dg Lumbar Spine Complete  Result Date: 03/14/2017 CLINICAL DATA:  Low back and left hip pain. EXAM:  LUMBAR SPINE - COMPLETE 4+ VIEW COMPARISON:  Lumbar radiographs dated 06/16/2016 FINDINGS: There is no fracture or bone destruction. There is chronic disc space narrowing and degenerative endplate changes at D3-2. There are degenerative changes of the vertebral endplates at I7-1 and I4-5, chronic. Alignment is normal. There is moderate facet arthritis at L5-S1 bilaterally, unchanged. Aortic atherosclerosis. Tiny stone in the lower pole of the left kidney, unchanged. IMPRESSION: No acute abnormality of the lumbar spine. No change since the prior study. Multilevel degenerative disc disease as described. Facet arthritis at L5-S1 as described. Aortic Atherosclerosis (ICD10-I70.0). Electronically Signed   By: Lorriane Shire M.D.   On: 03/14/2017 12:11   Dg Pelvis 1-2 Views  Result Date: 03/14/2017 CLINICAL DATA:  Low back and left hip pain. EXAM: PELVIS - 1-2 VIEW COMPARISON:  Scout image for CT scan of the abdomen and pelvis dated 05/18/2009 FINDINGS: There is no evidence of pelvic fracture or diastasis. No pelvic bone lesions are seen. SI joints  appear normal. Hip joints appear normal. IMPRESSION: Negative. Electronically Signed   By: Lorriane Shire M.D.   On: 03/14/2017 12:12   Dg Shoulder Left  Result Date: 03/14/2017 CLINICAL DATA:  Left shoulder pain for 1 month. EXAM: LEFT SHOULDER - 2+ VIEW COMPARISON:  None. FINDINGS: There is no evidence of fracture or dislocation. Mild AC joint arthropathy. Glenohumeral joint appears normal. Soft tissues are unremarkable. IMPRESSION: Mild left AC joint arthropathy. Electronically Signed   By: Lorriane Shire M.D.   On: 03/14/2017 12:15    Procedures Procedures (including critical care time)  Medications Ordered in ED Medications - No data to display   Initial Impression / Assessment and Plan / ED Course  I have reviewed the triage vital signs and the nursing notes.  Pertinent labs & imaging results that were available during my care of the patient were reviewed by me and considered in my medical decision making (see chart for details).    With her history of breast cancer of the shoulder, chest, hip, and pelvis, and L-spine were imaged and show no bony abnormalities.  No lytic lesions.  Minimal areas of focal arthritis, not diffuse.  Given physical therapy referral.  Daily Mobic if she has no contraindications.  Tramadol for pain at night.  Lidoderm patches.  Primary care follow-up if not improving.  Final Clinical Impressions(s) / ED Diagnoses   Final diagnoses:  Acute left-sided low back pain with left-sided sciatica  Tendinitis of left rotator cuff    ED Discharge Orders        Ordered    meloxicam (MOBIC) 7.5 MG tablet  Daily     03/14/17 1316    traMADol (ULTRAM) 50 MG tablet  Every 12 hours PRN     03/14/17 1316    lidocaine (LIDODERM) 5 %  Every 24 hours     03/14/17 1316       Tanna Furry, MD 03/14/17 1321

## 2017-04-08 DIAGNOSIS — M25519 Pain in unspecified shoulder: Secondary | ICD-10-CM | POA: Diagnosis not present

## 2017-04-08 DIAGNOSIS — R251 Tremor, unspecified: Secondary | ICD-10-CM | POA: Diagnosis not present

## 2017-05-19 DIAGNOSIS — L309 Dermatitis, unspecified: Secondary | ICD-10-CM | POA: Diagnosis not present

## 2017-05-19 DIAGNOSIS — Z853 Personal history of malignant neoplasm of breast: Secondary | ICD-10-CM | POA: Diagnosis not present

## 2017-05-19 DIAGNOSIS — Z9189 Other specified personal risk factors, not elsewhere classified: Secondary | ICD-10-CM | POA: Diagnosis not present

## 2017-05-26 DIAGNOSIS — I10 Essential (primary) hypertension: Secondary | ICD-10-CM | POA: Diagnosis not present

## 2017-05-26 DIAGNOSIS — R3915 Urgency of urination: Secondary | ICD-10-CM | POA: Diagnosis not present

## 2017-05-26 DIAGNOSIS — G4700 Insomnia, unspecified: Secondary | ICD-10-CM | POA: Diagnosis not present

## 2017-05-26 DIAGNOSIS — Z0000 Encounter for general adult medical examination without abnormal findings: Secondary | ICD-10-CM | POA: Diagnosis not present

## 2017-05-26 DIAGNOSIS — M25519 Pain in unspecified shoulder: Secondary | ICD-10-CM | POA: Diagnosis not present

## 2017-05-26 DIAGNOSIS — G3184 Mild cognitive impairment, so stated: Secondary | ICD-10-CM | POA: Diagnosis not present

## 2017-05-26 DIAGNOSIS — E785 Hyperlipidemia, unspecified: Secondary | ICD-10-CM | POA: Diagnosis not present

## 2017-05-26 DIAGNOSIS — G20 Parkinson's disease: Secondary | ICD-10-CM | POA: Diagnosis not present

## 2017-05-26 DIAGNOSIS — R197 Diarrhea, unspecified: Secondary | ICD-10-CM | POA: Diagnosis not present

## 2017-05-27 DIAGNOSIS — R3915 Urgency of urination: Secondary | ICD-10-CM | POA: Diagnosis not present

## 2017-05-27 DIAGNOSIS — G20 Parkinson's disease: Secondary | ICD-10-CM | POA: Diagnosis not present

## 2017-05-27 DIAGNOSIS — G4700 Insomnia, unspecified: Secondary | ICD-10-CM | POA: Diagnosis not present

## 2017-05-27 DIAGNOSIS — R197 Diarrhea, unspecified: Secondary | ICD-10-CM | POA: Diagnosis not present

## 2017-05-27 DIAGNOSIS — Z0000 Encounter for general adult medical examination without abnormal findings: Secondary | ICD-10-CM | POA: Diagnosis not present

## 2017-05-27 DIAGNOSIS — I10 Essential (primary) hypertension: Secondary | ICD-10-CM | POA: Diagnosis not present

## 2017-05-27 DIAGNOSIS — E785 Hyperlipidemia, unspecified: Secondary | ICD-10-CM | POA: Diagnosis not present

## 2017-05-27 DIAGNOSIS — G3184 Mild cognitive impairment, so stated: Secondary | ICD-10-CM | POA: Diagnosis not present

## 2017-05-27 DIAGNOSIS — M25519 Pain in unspecified shoulder: Secondary | ICD-10-CM | POA: Diagnosis not present

## 2017-05-30 DIAGNOSIS — B078 Other viral warts: Secondary | ICD-10-CM | POA: Diagnosis not present

## 2017-05-30 DIAGNOSIS — Z85828 Personal history of other malignant neoplasm of skin: Secondary | ICD-10-CM | POA: Diagnosis not present

## 2017-05-30 DIAGNOSIS — L570 Actinic keratosis: Secondary | ICD-10-CM | POA: Diagnosis not present

## 2017-05-30 DIAGNOSIS — L821 Other seborrheic keratosis: Secondary | ICD-10-CM | POA: Diagnosis not present

## 2017-05-30 DIAGNOSIS — L708 Other acne: Secondary | ICD-10-CM | POA: Diagnosis not present

## 2017-06-01 DIAGNOSIS — I10 Essential (primary) hypertension: Secondary | ICD-10-CM | POA: Diagnosis not present

## 2017-06-01 DIAGNOSIS — R197 Diarrhea, unspecified: Secondary | ICD-10-CM | POA: Diagnosis not present

## 2017-09-13 DIAGNOSIS — N390 Urinary tract infection, site not specified: Secondary | ICD-10-CM | POA: Diagnosis not present

## 2017-09-13 DIAGNOSIS — R251 Tremor, unspecified: Secondary | ICD-10-CM | POA: Diagnosis not present

## 2017-09-13 DIAGNOSIS — M25519 Pain in unspecified shoulder: Secondary | ICD-10-CM | POA: Diagnosis not present

## 2017-09-13 DIAGNOSIS — M79671 Pain in right foot: Secondary | ICD-10-CM | POA: Diagnosis not present

## 2017-09-13 DIAGNOSIS — G3184 Mild cognitive impairment, so stated: Secondary | ICD-10-CM | POA: Diagnosis not present

## 2017-09-13 DIAGNOSIS — G20 Parkinson's disease: Secondary | ICD-10-CM | POA: Diagnosis not present

## 2017-09-13 DIAGNOSIS — R300 Dysuria: Secondary | ICD-10-CM | POA: Diagnosis not present

## 2017-10-28 DIAGNOSIS — Z23 Encounter for immunization: Secondary | ICD-10-CM | POA: Diagnosis not present

## 2017-12-14 DIAGNOSIS — G20 Parkinson's disease: Secondary | ICD-10-CM | POA: Diagnosis not present

## 2017-12-14 DIAGNOSIS — G4700 Insomnia, unspecified: Secondary | ICD-10-CM | POA: Diagnosis not present

## 2018-01-02 ENCOUNTER — Other Ambulatory Visit: Payer: Self-pay | Admitting: Family Medicine

## 2018-01-02 DIAGNOSIS — Z1231 Encounter for screening mammogram for malignant neoplasm of breast: Secondary | ICD-10-CM

## 2018-02-08 ENCOUNTER — Ambulatory Visit
Admission: RE | Admit: 2018-02-08 | Discharge: 2018-02-08 | Disposition: A | Discharge status: Home / Self Care | Payer: Medicare Other | Source: Ambulatory Visit | Attending: Family Medicine | Admitting: Family Medicine

## 2018-02-08 DIAGNOSIS — Z1231 Encounter for screening mammogram for malignant neoplasm of breast: Secondary | ICD-10-CM

## 2019-01-03 ENCOUNTER — Other Ambulatory Visit: Payer: Self-pay | Admitting: Family Medicine

## 2019-01-03 DIAGNOSIS — Z1231 Encounter for screening mammogram for malignant neoplasm of breast: Secondary | ICD-10-CM

## 2019-01-25 ENCOUNTER — Inpatient Hospital Stay (HOSPITAL_COMMUNITY)
Admission: EM | Admit: 2019-01-25 | Discharge: 2019-01-29 | DRG: 392 | Disposition: A | Discharge status: Home Health | Payer: Medicare Other | Attending: Family Medicine | Admitting: Family Medicine

## 2019-01-25 ENCOUNTER — Emergency Department (HOSPITAL_COMMUNITY): Payer: Medicare Other

## 2019-01-25 ENCOUNTER — Encounter (HOSPITAL_COMMUNITY): Payer: Self-pay

## 2019-01-25 ENCOUNTER — Other Ambulatory Visit: Payer: Self-pay

## 2019-01-25 DIAGNOSIS — Z923 Personal history of irradiation: Secondary | ICD-10-CM

## 2019-01-25 DIAGNOSIS — Z96651 Presence of right artificial knee joint: Secondary | ICD-10-CM | POA: Diagnosis present

## 2019-01-25 DIAGNOSIS — I10 Essential (primary) hypertension: Secondary | ICD-10-CM | POA: Diagnosis present

## 2019-01-25 DIAGNOSIS — W19XXXA Unspecified fall, initial encounter: Secondary | ICD-10-CM

## 2019-01-25 DIAGNOSIS — W109XXA Fall (on) (from) unspecified stairs and steps, initial encounter: Secondary | ICD-10-CM | POA: Diagnosis present

## 2019-01-25 DIAGNOSIS — K5792 Diverticulitis of intestine, part unspecified, without perforation or abscess without bleeding: Secondary | ICD-10-CM | POA: Diagnosis not present

## 2019-01-25 DIAGNOSIS — K5720 Diverticulitis of large intestine with perforation and abscess without bleeding: Secondary | ICD-10-CM | POA: Diagnosis not present

## 2019-01-25 DIAGNOSIS — G20A1 Parkinson's disease without dyskinesia, without mention of fluctuations: Secondary | ICD-10-CM | POA: Diagnosis present

## 2019-01-25 DIAGNOSIS — S0101XA Laceration without foreign body of scalp, initial encounter: Secondary | ICD-10-CM

## 2019-01-25 DIAGNOSIS — E876 Hypokalemia: Secondary | ICD-10-CM | POA: Diagnosis not present

## 2019-01-25 DIAGNOSIS — Z885 Allergy status to narcotic agent status: Secondary | ICD-10-CM

## 2019-01-25 DIAGNOSIS — F0280 Dementia in other diseases classified elsewhere without behavioral disturbance: Secondary | ICD-10-CM | POA: Diagnosis present

## 2019-01-25 DIAGNOSIS — G20 Parkinson's disease: Secondary | ICD-10-CM | POA: Diagnosis present

## 2019-01-25 DIAGNOSIS — N390 Urinary tract infection, site not specified: Secondary | ICD-10-CM | POA: Diagnosis present

## 2019-01-25 DIAGNOSIS — S0990XA Unspecified injury of head, initial encounter: Secondary | ICD-10-CM

## 2019-01-25 DIAGNOSIS — Y92018 Other place in single-family (private) house as the place of occurrence of the external cause: Secondary | ICD-10-CM

## 2019-01-25 DIAGNOSIS — Z20828 Contact with and (suspected) exposure to other viral communicable diseases: Secondary | ICD-10-CM | POA: Diagnosis present

## 2019-01-25 DIAGNOSIS — Z86000 Personal history of in-situ neoplasm of breast: Secondary | ICD-10-CM

## 2019-01-25 DIAGNOSIS — Z23 Encounter for immunization: Secondary | ICD-10-CM

## 2019-01-25 DIAGNOSIS — F0390 Unspecified dementia without behavioral disturbance: Secondary | ICD-10-CM | POA: Diagnosis present

## 2019-01-25 DIAGNOSIS — Z791 Long term (current) use of non-steroidal anti-inflammatories (NSAID): Secondary | ICD-10-CM

## 2019-01-25 LAB — CBC WITH DIFFERENTIAL/PLATELET
Abs Immature Granulocytes: 0.08 10*3/uL — ABNORMAL HIGH (ref 0.00–0.07)
Basophils Absolute: 0.1 10*3/uL (ref 0.0–0.1)
Basophils Relative: 0 %
Eosinophils Absolute: 0.1 10*3/uL (ref 0.0–0.5)
Eosinophils Relative: 0 %
HCT: 41.8 % (ref 36.0–46.0)
Hemoglobin: 12.9 g/dL (ref 12.0–15.0)
Immature Granulocytes: 1 %
Lymphocytes Relative: 10 %
Lymphs Abs: 1.4 10*3/uL (ref 0.7–4.0)
MCH: 29.1 pg (ref 26.0–34.0)
MCHC: 30.9 g/dL (ref 30.0–36.0)
MCV: 94.1 fL (ref 80.0–100.0)
Monocytes Absolute: 0.8 10*3/uL (ref 0.1–1.0)
Monocytes Relative: 6 %
Neutro Abs: 11.7 10*3/uL — ABNORMAL HIGH (ref 1.7–7.7)
Neutrophils Relative %: 83 %
Platelets: 244 10*3/uL (ref 150–400)
RBC: 4.44 MIL/uL (ref 3.87–5.11)
RDW: 14.8 % (ref 11.5–15.5)
WBC: 14.1 10*3/uL — ABNORMAL HIGH (ref 4.0–10.5)
nRBC: 0 % (ref 0.0–0.2)

## 2019-01-25 LAB — COMPREHENSIVE METABOLIC PANEL
ALT: 6 U/L (ref 0–44)
AST: 19 U/L (ref 15–41)
Albumin: 3.7 g/dL (ref 3.5–5.0)
Alkaline Phosphatase: 59 U/L (ref 38–126)
Anion gap: 10 (ref 5–15)
BUN: 16 mg/dL (ref 8–23)
CO2: 26 mmol/L (ref 22–32)
Calcium: 9.1 mg/dL (ref 8.9–10.3)
Chloride: 103 mmol/L (ref 98–111)
Creatinine, Ser: 0.91 mg/dL (ref 0.44–1.00)
GFR calc Af Amer: >60 mL/min (ref >60)
GFR calc non Af Amer: >60 mL/min (ref >60)
Glucose, Bld: 141 mg/dL — ABNORMAL HIGH (ref 70–99)
Potassium: 3.7 mmol/L (ref 3.5–5.1)
Sodium: 139 mmol/L (ref 135–145)
Total Bilirubin: 0.9 mg/dL (ref 0.3–1.2)
Total Protein: 6.6 g/dL (ref 6.5–8.1)

## 2019-01-25 LAB — LIPASE, BLOOD: Lipase: 24 U/L (ref 11–51)

## 2019-01-25 MED ORDER — TETANUS-DIPHTH-ACELL PERTUSSIS 5-2.5-18.5 LF-MCG/0.5 IM SUSP
0.5000 mL | Freq: Once | INTRAMUSCULAR | Status: AC
  Administered 2019-01-26: 02:00:00 0.5 mL via INTRAMUSCULAR
  Filled 2019-01-25: qty 0.5

## 2019-01-25 MED ORDER — IOHEXOL 300 MG/ML  SOLN
100.0000 mL | Freq: Once | INTRAMUSCULAR | Status: AC | PRN
  Administered 2019-01-25: 100 mL via INTRAVENOUS

## 2019-01-25 MED ORDER — LIDOCAINE-EPINEPHRINE (PF) 2 %-1:200000 IJ SOLN
10.0000 mL | Freq: Once | INTRAMUSCULAR | Status: AC
  Administered 2019-01-26: 10 mL
  Filled 2019-01-25: qty 20

## 2019-01-25 NOTE — ED Provider Notes (Signed)
[facility] EMERGENCY DEPARTMENT Provider Note   CSN: [id number] Arrival date & time: 01/25/19  2112     History Chief Complaint  Patient presents with  . Fall    Katie Carrillo is a 78 y.o. female with a past medical history of ductal carcinoma in situ status post lumpectomy, Parkinson's, hypertension, dementia, who presents today for evaluation of a fall.  History obtained primarily from triage note and patient's husband.  Patient's husband reports that patient was getting ready to go for a walk outside.  They have 3 steps and she went to reach for the handrail and missed.  She then fell backwards down the steps hitting the back of her head on the ground.  He states that she was unconscious for 1 to 2 minutes, however when she woke up she was not confused.  He denies any seizure-like activity.    He reports that she has been "slow" like she is not feeling well today.  Patient does not remember falling or what she was doing.  She states that she has had abdominal pain for the past 1 to 1.5 weeks thinks it is her diverticulitis.  Last BM was yesterday and was normal for her.  She reports that she has a wound on the back of her head.  She is unsure when her last tetanus shot was.  She states that the area that hurts her the most is her right ear where the c-collar is pressing on it.  HPI     Past Medical History:  Diagnosis Date  . Cancer [facility])    breast      lumpectomy   lt  . Dementia   . Hypertension    dr Loney Hering    [employer]  . Personal history of radiation therapy 2008  . Urinary urgency     Patient Active Problem List   Diagnosis Date Noted  . Dense breast tissue 03/02/2014  . DCIS (ductal carcinoma in situ) of breast 09/30/2011  . Osteoarthritis of right knee 07/02/2011    Past Surgical History:  Procedure Laterality Date  . ABDOMINAL HYSTERECTOMY    . BREAST BIOPSY Left 01/2006  . BREAST EXCISIONAL BIOPSY Right   . BREAST LUMPECTOMY  Left 02/2006  . BREAST SURGERY     lt  . hemmoriods    . TOTAL KNEE ARTHROPLASTY  06/30/2011   Procedure: TOTAL KNEE ARTHROPLASTY;  Surgeon: Kerin Salen, MD;  Location: [place];  Service: Orthopedics;  Laterality: Right;     OB History   No obstetric history on file.     History reviewed. No pertinent family history.  Social History   Tobacco Use  . Smoking status: Never Smoker  . Smokeless tobacco: Never Used  Substance Use Topics  . Alcohol use: No  . Drug use: No    Home Medications Prior to Admission medications   Medication Sig Start Date End Date Taking? Authorizing Provider  donepezil (ARICEPT) 10 MG tablet Take 10 mg by mouth Daily. 09/17/11   [provider]  lidocaine (LIDODERM) 5 % Place 1 patch onto the skin daily. Apply to painful area for 12 hours each day or night as needed. 03/14/17   Tanna Furry, MD  lisinopril-hydrochlorothiazide (PRINZIDE,ZESTORETIC) 10-12.5 MG per tablet 10-12.5 mg. 08/30/11   [provider]  meloxicam (MOBIC) 7.5 MG tablet Take 1 tablet (7.5 mg total) by mouth daily. 03/14/17   Tanna Furry, MD  Multiple Vitamin (MULITIVITAMIN WITH MINERALS) TABS Take 1  tablet by mouth daily.    [provider]  Omega-3 Fatty Acids (FISH OIL) 600 MG CAPS Take 1 tablet by mouth 2 (two) times daily.    [provider]  oxybutynin (DITROPAN) 5 MG tablet Take 5 mg by mouth Twice daily. 09/27/11   [provider]  oxyCODONE-acetaminophen (PERCOCET/ROXICET) 5-325 MG tablet Take 1 tablet by mouth every 6 (six) hours as needed for severe pain. 06/16/16   Mackuen, Courteney Lyn, MD  Red Yeast Rice 600 MG CAPS Take 2 capsules by mouth every evening.     [provider]  tamsulosin (FLOMAX) 0.4 MG CAPS capsule Take 1 capsule (0.4 mg total) by mouth daily. 06/16/16   Mackuen, Courteney Lyn, MD  traMADol (ULTRAM) 50 MG tablet Take 1 tablet (50 mg total) by mouth every 12 (twelve) hours as needed for moderate pain. 03/14/17    Tanna Furry, MD    Allergies    Morphine and related  Review of Systems   Review of Systems  Constitutional: Negative for chills and fever.  Respiratory: Negative for choking, chest tightness and shortness of breath.   Gastrointestinal: Positive for abdominal pain. Negative for blood in stool, diarrhea, nausea and vomiting.  Genitourinary: Negative for dysuria and urgency.  Musculoskeletal: Positive for neck pain. Negative for back pain.  Skin: Negative for color change and rash.  Neurological: Positive for syncope.  Psychiatric/Behavioral: Negative for confusion.  All other systems reviewed and are negative.   Physical Exam Updated Vital Signs BP (!) 160/76 (BP Location: Right Arm)   Pulse 64   Temp 98.7 F (37.1 C) (Oral)   Resp 18   Ht 5\' 8"  (1.727 m)   Wt 86.2 kg   SpO2 93%   BMI 28.89 kg/m   Physical Exam Vitals and nursing note reviewed.  Constitutional:      General: She is not in acute distress.    Appearance: She is well-developed.  HENT:     Head: Normocephalic.     Comments: Dried blood on the right sided posterior scalp.  Appears to be a 1cm laceration with controlled bleeding, however exam limited by dried blood and c-collar in place.  Eyes:     Conjunctiva/sclera: Conjunctivae normal.  Neck:     Comments: C-collar in place.  Cardiovascular:     Rate and Rhythm: Normal rate and regular rhythm.     Heart sounds: No murmur.  Pulmonary:     Effort: Pulmonary effort is normal. No respiratory distress.     Breath sounds: Normal breath sounds.  Abdominal:     Palpations: Abdomen is soft.     Tenderness: There is abdominal tenderness (Generalized, worse in lower. ).  Skin:    General: Skin is warm and dry.  Neurological:     General: No focal deficit present.     Mental Status: She is alert.     Comments: Patient is awake and alert, oriented to person, place, and time.  She does not remember events directly leading up to her fall.  She is able to lift  both legs off the bed.  5/5 grip strength bilaterally.  Slight tremor in hands bilaterally.   Psychiatric:        Mood and Affect: Mood normal.        Behavior: Behavior normal.     ED Results / Procedures / Treatments   Labs (all labs ordered are listed, but only abnormal results are displayed) Labs Reviewed  COMPREHENSIVE METABOLIC PANEL - Abnormal; Notable  for the following components:      Result Value   Glucose, Bld 141 (*)    All other components within normal limits  CBC WITH DIFFERENTIAL/PLATELET - Abnormal; Notable for the following components:   WBC 14.1 (*)    Neutro Abs 11.7 (*)    Abs Immature Granulocytes 0.08 (*)    All other components within normal limits  LIPASE, BLOOD  URINALYSIS, ROUTINE W REFLEX MICROSCOPIC    EKG EKG Interpretation  Date/Time:  Thursday January 25 2019 21:45:09 EST Ventricular Rate:  63 PR Interval:    QRS Duration: 99 QT Interval:  427 QTC Calculation: 438 R Axis:   59 Text Interpretation: Sinus rhythm Consider left atrial enlargement Left ventricular hypertrophy No significant change since last tracing Confirmed by Isla Pence [id number]) on 01/25/2019 9:47:59 PM   Radiology DG Chest 2 View  Result Date: 01/25/2019 CLINICAL DATA:  Syncope EXAM: CHEST - 2 VIEW COMPARISON:  March 14, 2017 FINDINGS: The heart size and mediastinal contours are within normal limits. Both lungs are clear. The visualized skeletal structures are unremarkable. IMPRESSION: No active cardiopulmonary disease. Electronically Signed   By: Prudencio Pair M.D.   On: 01/25/2019 22:13    Procedures Procedures (including critical care time)  Medications Ordered in ED Medications  Tdap (BOOSTRIX) injection 0.5 mL (has no administration in time range)  lidocaine-EPINEPHrine (XYLOCAINE W/EPI) 2 %-1:200000 (PF) injection 10 mL (has no administration in time range)    ED Course  I have reviewed the triage vital signs and the nursing notes.  Pertinent labs &  imaging results that were available during my care of the patient were reviewed by me and considered in my medical decision making (see chart for details).  Clinical Course as of Jan 25 2327  Thu Jan 25, 2019  2226 Consistent with baseline of 94%  SpO2: 93 % [EH]    Clinical Course User Index [EH] Ollen Gross   MDM Rules/Calculators/A&P                     Patient presents today for evaluation after a fall.  She reportedly went to reach for a rail that she missed causing her to fall backwards down 3 steps, however unsure why she missed the rail.  She does report that she has had abdominal pain over the past 1 to 1.5 weeks that she is concerned may be her diverticulitis.  EKG obtained without evidence of ischemia or acute abnormality.  Labs obtained and reviewed, CBC shows mild leukocytosis at 14.1.  CMP without significant derangements.  CT head, neck and abdomen pelvis are still pending at this time.  Patient is unsure when her last tetanus shot was Tdap was ordered.  At shift change care was transferred to [facility] who will follow pending studies, re-evaulate and determine disposition.     Final Clinical Impression(s) / ED Diagnoses Final diagnoses:  Fall, initial encounter  Injury of head, initial encounter  Laceration of scalp, initial encounter    Rx / DC Orders ED Discharge Orders    None       Ollen Gross 01/25/19 2328    Isla Pence, MD 02/05/19 [phone]

## 2019-01-25 NOTE — Discharge Instructions (Addendum)

## 2019-01-25 NOTE — ED Triage Notes (Signed)
Pt bib ems reports that the pt fell from the third step. LOC for 1 min. Pt AOx4 on arrival. No blood thinners. Pt takes medications for memory. Small lac and hematoma to the back of the head.   139/72 Hr 70 98% RA RR18 cbg 153

## 2019-01-26 DIAGNOSIS — I10 Essential (primary) hypertension: Secondary | ICD-10-CM | POA: Diagnosis present

## 2019-01-26 DIAGNOSIS — K5792 Diverticulitis of intestine, part unspecified, without perforation or abscess without bleeding: Secondary | ICD-10-CM | POA: Diagnosis present

## 2019-01-26 DIAGNOSIS — N390 Urinary tract infection, site not specified: Secondary | ICD-10-CM | POA: Diagnosis present

## 2019-01-26 DIAGNOSIS — K5720 Diverticulitis of large intestine with perforation and abscess without bleeding: Secondary | ICD-10-CM | POA: Diagnosis present

## 2019-01-26 DIAGNOSIS — Z86000 Personal history of in-situ neoplasm of breast: Secondary | ICD-10-CM | POA: Diagnosis not present

## 2019-01-26 DIAGNOSIS — Z885 Allergy status to narcotic agent status: Secondary | ICD-10-CM | POA: Diagnosis not present

## 2019-01-26 DIAGNOSIS — F0390 Unspecified dementia without behavioral disturbance: Secondary | ICD-10-CM | POA: Diagnosis not present

## 2019-01-26 DIAGNOSIS — S0101XA Laceration without foreign body of scalp, initial encounter: Secondary | ICD-10-CM | POA: Diagnosis present

## 2019-01-26 DIAGNOSIS — W109XXA Fall (on) (from) unspecified stairs and steps, initial encounter: Secondary | ICD-10-CM | POA: Diagnosis present

## 2019-01-26 DIAGNOSIS — G20 Parkinson's disease: Secondary | ICD-10-CM

## 2019-01-26 DIAGNOSIS — Z923 Personal history of irradiation: Secondary | ICD-10-CM | POA: Diagnosis not present

## 2019-01-26 DIAGNOSIS — Z23 Encounter for immunization: Secondary | ICD-10-CM | POA: Diagnosis present

## 2019-01-26 DIAGNOSIS — F0280 Dementia in other diseases classified elsewhere without behavioral disturbance: Secondary | ICD-10-CM | POA: Diagnosis present

## 2019-01-26 DIAGNOSIS — Z96651 Presence of right artificial knee joint: Secondary | ICD-10-CM | POA: Diagnosis present

## 2019-01-26 DIAGNOSIS — E876 Hypokalemia: Secondary | ICD-10-CM | POA: Diagnosis not present

## 2019-01-26 DIAGNOSIS — Z791 Long term (current) use of non-steroidal anti-inflammatories (NSAID): Secondary | ICD-10-CM | POA: Diagnosis not present

## 2019-01-26 DIAGNOSIS — Y92018 Other place in single-family (private) house as the place of occurrence of the external cause: Secondary | ICD-10-CM | POA: Diagnosis not present

## 2019-01-26 DIAGNOSIS — Z20828 Contact with and (suspected) exposure to other viral communicable diseases: Secondary | ICD-10-CM | POA: Diagnosis present

## 2019-01-26 LAB — CBC
HCT: 37.3 % (ref 36.0–46.0)
Hemoglobin: 12 g/dL (ref 12.0–15.0)
MCH: 29.1 pg (ref 26.0–34.0)
MCHC: 32.2 g/dL (ref 30.0–36.0)
MCV: 90.5 fL (ref 80.0–100.0)
Platelets: 208 10*3/uL (ref 150–400)
RBC: 4.12 MIL/uL (ref 3.87–5.11)
RDW: 14.9 % (ref 11.5–15.5)
WBC: 12.1 10*3/uL — ABNORMAL HIGH (ref 4.0–10.5)
nRBC: 0 % (ref 0.0–0.2)

## 2019-01-26 LAB — URINALYSIS, ROUTINE W REFLEX MICROSCOPIC
Bacteria, UA: NONE SEEN
Bilirubin Urine: NEGATIVE
Glucose, UA: NEGATIVE mg/dL
Hgb urine dipstick: NEGATIVE
Ketones, ur: NEGATIVE mg/dL
Leukocytes,Ua: NEGATIVE
Nitrite: POSITIVE — AB
Protein, ur: NEGATIVE mg/dL
Specific Gravity, Urine: >1.046 — ABNORMAL HIGH (ref 1.005–1.030)
pH: 6 (ref 5.0–8.0)

## 2019-01-26 LAB — BASIC METABOLIC PANEL
Anion gap: 9 (ref 5–15)
BUN: 14 mg/dL (ref 8–23)
CO2: 29 mmol/L (ref 22–32)
Calcium: 8.9 mg/dL (ref 8.9–10.3)
Chloride: 102 mmol/L (ref 98–111)
Creatinine, Ser: 0.74 mg/dL (ref 0.44–1.00)
GFR calc Af Amer: >60 mL/min (ref >60)
GFR calc non Af Amer: >60 mL/min (ref >60)
Glucose, Bld: 119 mg/dL — ABNORMAL HIGH (ref 70–99)
Potassium: 3.5 mmol/L (ref 3.5–5.1)
Sodium: 140 mmol/L (ref 135–145)

## 2019-01-26 LAB — C DIFFICILE QUICK SCREEN W PCR REFLEX
C Diff antigen: NEGATIVE
C Diff interpretation: NOT DETECTED
C Diff toxin: NEGATIVE

## 2019-01-26 LAB — SARS CORONAVIRUS 2 (TAT 6-24 HRS): SARS Coronavirus 2: NEGATIVE

## 2019-01-26 MED ORDER — DONEPEZIL HCL 10 MG PO TABS
10.0000 mg | ORAL_TABLET | Freq: Every day | ORAL | Status: DC
  Administered 2019-01-26 – 2019-01-28 (×3): 10 mg via ORAL
  Filled 2019-01-26 (×4): qty 1

## 2019-01-26 MED ORDER — OXYBUTYNIN CHLORIDE 5 MG PO TABS
5.0000 mg | ORAL_TABLET | Freq: Two times a day (BID) | ORAL | Status: DC
  Administered 2019-01-26 – 2019-01-29 (×6): 5 mg via ORAL
  Filled 2019-01-26 (×7): qty 1

## 2019-01-26 MED ORDER — PIPERACILLIN-TAZOBACTAM 3.375 G IVPB 30 MIN
3.3750 g | Freq: Once | INTRAVENOUS | Status: AC
  Administered 2019-01-26: 02:00:00 3.375 g via INTRAVENOUS
  Filled 2019-01-26: qty 50

## 2019-01-26 MED ORDER — ENOXAPARIN SODIUM 40 MG/0.4ML ~~LOC~~ SOLN
40.0000 mg | SUBCUTANEOUS | Status: DC
  Administered 2019-01-26 – 2019-01-29 (×4): 40 mg via SUBCUTANEOUS
  Filled 2019-01-26 (×4): qty 0.4

## 2019-01-26 MED ORDER — SODIUM CHLORIDE 0.9 % IV SOLN
2.0000 g | INTRAVENOUS | Status: DC
  Administered 2019-01-26 – 2019-01-29 (×4): 2 g via INTRAVENOUS
  Filled 2019-01-26 (×2): qty 2
  Filled 2019-01-26: qty 20
  Filled 2019-01-26 (×2): qty 2

## 2019-01-26 MED ORDER — ONDANSETRON HCL 4 MG PO TABS: 4.0000 mg | ORAL_TABLET | Freq: Four times a day (QID) | ORAL | Status: DC | PRN

## 2019-01-26 MED ORDER — ACETAMINOPHEN 325 MG PO TABS
650.0000 mg | ORAL_TABLET | Freq: Four times a day (QID) | ORAL | Status: DC | PRN
  Administered 2019-01-26 – 2019-01-28 (×4): 650 mg via ORAL
  Filled 2019-01-26 (×4): qty 2

## 2019-01-26 MED ORDER — METRONIDAZOLE IN NACL 5-0.79 MG/ML-% IV SOLN
500.0000 mg | Freq: Three times a day (TID) | INTRAVENOUS | Status: DC
  Administered 2019-01-26 – 2019-01-29 (×10): 500 mg via INTRAVENOUS
  Filled 2019-01-26 (×11): qty 100

## 2019-01-26 MED ORDER — HYDROCHLOROTHIAZIDE 12.5 MG PO CAPS
12.5000 mg | ORAL_CAPSULE | Freq: Every day | ORAL | Status: DC
  Administered 2019-01-26 – 2019-01-29 (×4): 12.5 mg via ORAL
  Filled 2019-01-26 (×4): qty 1

## 2019-01-26 MED ORDER — LISINOPRIL 10 MG PO TABS
10.0000 mg | ORAL_TABLET | Freq: Every day | ORAL | Status: DC
  Administered 2019-01-26 – 2019-01-29 (×4): 10 mg via ORAL
  Filled 2019-01-26 (×4): qty 1

## 2019-01-26 MED ORDER — ACETAMINOPHEN 650 MG RE SUPP: 650.0000 mg | Freq: Four times a day (QID) | RECTAL | Status: DC | PRN

## 2019-01-26 MED ORDER — ONDANSETRON HCL 4 MG/2ML IJ SOLN: 4.0000 mg | Freq: Four times a day (QID) | INTRAMUSCULAR | Status: DC | PRN

## 2019-01-26 MED ORDER — TAMSULOSIN HCL 0.4 MG PO CAPS
0.4000 mg | ORAL_CAPSULE | Freq: Every day | ORAL | Status: DC
  Administered 2019-01-26 – 2019-01-29 (×4): 0.4 mg via ORAL
  Filled 2019-01-26 (×4): qty 1

## 2019-01-26 MED ORDER — LISINOPRIL-HYDROCHLOROTHIAZIDE 10-12.5 MG PO TABS: 1.0000 | ORAL_TABLET | Freq: Every day | ORAL | Status: DC

## 2019-01-26 NOTE — Plan of Care (Signed)
  Problem: Pain Managment: Goal: General experience of comfort will improve Outcome: Progressing   Problem: Safety: Goal: Ability to remain free from injury will improve Outcome: Progressing   Problem: Skin Integrity: Goal: Risk for impaired skin integrity will decrease Outcome: Progressing   

## 2019-01-26 NOTE — H&P (Addendum)
History and Physical    Katie Carrillo [id number] DOB: 07-05-1940 DOA: 01/25/2019  PCP: Lawerance Cruel, MD  Patient coming from: Home  I have personally briefly reviewed patient's old medical records in [place]  Chief Complaint: Fall, hit head  HPI: Katie Carrillo is a 78 y.o. female with medical history significant of dementia, HTN, parkinsons dz, prior diverticulitis.  Has been having abd pain for past 1.5 weeks or so that she thinks (correctly) is her diverticulitis.  Not as active today.  Slipped and hit head while going down 3 stairs.   ED Course: Other than head lac, no intracranial injury, CT head and c spine are clear.  Does have WBC of 14k.  And CT abd/pelvis does indeed confirm diverticulitis with extensive phlegmon.  No obvious perf nor abscess though per radiologist.   Review of Systems: As per HPI, otherwise all review of systems negative.  Past Medical History:  Diagnosis Date  . Cancer [facility])    breast      lumpectomy   lt  . Dementia   . Hypertension    dr Loney Hering    [employer]  . Personal history of radiation therapy 2008  . Urinary urgency     Past Surgical History:  Procedure Laterality Date  . ABDOMINAL HYSTERECTOMY    . BREAST BIOPSY Left 01/2006  . BREAST EXCISIONAL BIOPSY Right   . BREAST LUMPECTOMY Left 02/2006  . BREAST SURGERY     lt  . hemmoriods    . TOTAL KNEE ARTHROPLASTY  06/30/2011   Procedure: TOTAL KNEE ARTHROPLASTY;  Surgeon: Kerin Salen, MD;  Location: [place];  Service: Orthopedics;  Laterality: Right;     reports that she has never smoked. She has never used smokeless tobacco. She reports that she does not drink alcohol or use drugs.  Allergies  Allergen Reactions  . Morphine And Related Other (See Comments)    Hallucinations     History reviewed. No pertinent family history.   Prior to Admission medications   Medication Sig Start Date End Date Taking? Authorizing Provider  donepezil (ARICEPT) 10  MG tablet Take 10 mg by mouth Daily. 09/17/11   [provider]  lidocaine (LIDODERM) 5 % Place 1 patch onto the skin daily. Apply to painful area for 12 hours each day or night as needed. 03/14/17   Tanna Furry, MD  lisinopril-hydrochlorothiazide (PRINZIDE,ZESTORETIC) 10-12.5 MG per tablet 10-12.5 mg. 08/30/11   [provider]  meloxicam (MOBIC) 7.5 MG tablet Take 1 tablet (7.5 mg total) by mouth daily. 03/14/17   Tanna Furry, MD  Multiple Vitamin (MULITIVITAMIN WITH MINERALS) TABS Take 1 tablet by mouth daily.    [provider]  Omega-3 Fatty Acids (FISH OIL) 600 MG CAPS Take 1 tablet by mouth 2 (two) times daily.    [provider]  oxybutynin (DITROPAN) 5 MG tablet Take 5 mg by mouth Twice daily. 09/27/11   [provider]  oxyCODONE-acetaminophen (PERCOCET/ROXICET) 5-325 MG tablet Take 1 tablet by mouth every 6 (six) hours as needed for severe pain. 06/16/16   Mackuen, Courteney Lyn, MD  Red Yeast Rice 600 MG CAPS Take 2 capsules by mouth every evening.     [provider]  tamsulosin (FLOMAX) 0.4 MG CAPS capsule Take 1 capsule (0.4 mg total) by mouth daily. 06/16/16   Mackuen, Courteney Lyn, MD  traMADol (ULTRAM) 50 MG tablet Take 1 tablet (50 mg total) by mouth every 12 (twelve) hours as needed  for moderate pain. 03/14/17   Tanna Furry, MD    Physical Exam: Vitals:   01/25/19 2124 01/25/19 2128 01/25/19 2353 01/26/19 0015  BP:  (!) 160/76 (!) 151/72 130/67  Pulse:  64 72 (!) 59  Resp:  18 14 12   Temp: 98.9 F (37.2 C) 98.7 F (37.1 C)    TempSrc: Oral Oral    SpO2:  93% 95% 93%  Weight:      Height:        Constitutional: NAD, calm, comfortable Eyes: PERRL, lids and conjunctivae normal ENMT: Mucous membranes are moist. Posterior pharynx clear of any exudate or lesions.Normal dentition.  Neck: normal, supple, no masses, no thyromegaly Respiratory: clear to auscultation bilaterally, no wheezing, no crackles. Normal respiratory  effort. No accessory muscle use.  Cardiovascular: Regular rate and rhythm, no murmurs / rubs / gallops. No extremity edema. 2+ pedal pulses. No carotid bruits.  Abdomen: TTP Musculoskeletal: no clubbing / cyanosis. No joint deformity upper and lower extremities. Good ROM, no contractures. Normal muscle tone.  Skin: no rashes, lesions, ulcers. No induration Neurologic: CN 2-12 grossly intact. Sensation intact, DTR normal. Strength 5/5 in all 4.  Psychiatric: Normal judgment and insight. Alert and oriented x 3. Normal mood.    Labs on Admission: I have personally reviewed following labs and imaging studies  CBC: Recent Labs  Lab 01/25/19 2231  WBC 14.1*  NEUTROABS 11.7*  HGB 12.9  HCT 41.8  MCV 94.1  PLT [id number]   Basic Metabolic Panel: Recent Labs  Lab 01/25/19 2231  NA 139  K 3.7  CL 103  CO2 26  GLUCOSE 141*  BUN 16  CREATININE 0.91  CALCIUM 9.1   GFR: Estimated Creatinine Clearance: 59.5 mL/min (by C-G formula based on SCr of 0.91 mg/dL). Liver Function Tests: Recent Labs  Lab 01/25/19 2231  AST 19  ALT 6  ALKPHOS 59  BILITOT 0.9  PROT 6.6  ALBUMIN 3.7   Recent Labs  Lab 01/25/19 2231  LIPASE 24   No results for input(s): AMMONIA in the last 168 hours. Coagulation Profile: No results for input(s): INR, PROTIME in the last 168 hours. Cardiac Enzymes: No results for input(s): CKTOTAL, CKMB, CKMBINDEX, TROPONINI in the last 168 hours. BNP (last 3 results) No results for input(s): PROBNP in the last 8760 hours. HbA1C: No results for input(s): HGBA1C in the last 72 hours. CBG: No results for input(s): GLUCAP in the last 168 hours. Lipid Profile: No results for input(s): CHOL, HDL, LDLCALC, TRIG, CHOLHDL, LDLDIRECT in the last 72 hours. Thyroid Function Tests: No results for input(s): TSH, T4TOTAL, FREET4, T3FREE, THYROIDAB in the last 72 hours. Anemia Panel: No results for input(s): VITAMINB12, FOLATE, FERRITIN, TIBC, IRON, RETICCTPCT in the last 72  hours. Urine analysis:    Component Value Date/Time   COLORURINE YELLOW 06/16/2016 1828   APPEARANCEUR CLEAR 06/16/2016 1828   LABSPEC 1.009 06/16/2016 1828   PHURINE 5.5 06/16/2016 1828   GLUCOSEU NEGATIVE 06/16/2016 1828   HGBUR NEGATIVE 06/16/2016 1828   BILIRUBINUR NEGATIVE 06/16/2016 1828   KETONESUR NEGATIVE 06/16/2016 1828   PROTEINUR NEGATIVE 06/16/2016 1828   UROBILINOGEN 0.2 06/18/2011 1036   NITRITE NEGATIVE 06/16/2016 1828   LEUKOCYTESUR NEGATIVE 06/16/2016 1828    Radiological Exams on Admission: DG Chest 2 View  Result Date: 01/25/2019 CLINICAL DATA:  Syncope EXAM: CHEST - 2 VIEW COMPARISON:  March 14, 2017 FINDINGS: The heart size and mediastinal contours are within normal limits. Both lungs are clear. The visualized skeletal structures are  unremarkable. IMPRESSION: No active cardiopulmonary disease. Electronically Signed   By: Prudencio Pair M.D.   On: 01/25/2019 22:13   CT Head Wo Contrast  Result Date: 01/25/2019 CLINICAL DATA:  Fall down 3 steps EXAM: CT HEAD WITHOUT CONTRAST TECHNIQUE: Contiguous axial images were obtained from the base of the skull through the vertex without intravenous contrast. COMPARISON:  None. FINDINGS: Brain: No evidence of acute territorial infarction, hemorrhage, hydrocephalus,extra-axial collection or mass lesion/mass effect. There is dilatation the ventricles and sulci consistent with age-related atrophy. Low-attenuation changes in the deep white matter consistent with small vessel ischemia. Vascular: No hyperdense vessel or unexpected calcification. Skull: The skull is intact. No fracture or focal lesion identified. Sinuses/Orbits: The visualized paranasal sinuses and mastoid air cells are clear. The orbits and globes intact. Other: Soft tissue hematoma seen overlying the right parietal skull. Cervical spine: Alignment: Straightening of the normal cervical lordosis. Skull base and vertebrae: Visualized skull base is intact. No  atlanto-occipital dissociation. The vertebral body heights are well maintained. No fracture or pathologic osseous lesion seen. Large anterior osteophytes seen in the mid to lower cervical spine. Soft tissues and spinal canal: The visualized paraspinal soft tissues are unremarkable. No prevertebral soft tissue swelling is seen. The spinal canal is grossly unremarkable, no large epidural collection or significant canal narrowing. Disc levels: Cervical spine spondylosis is seen most notable at C5-C6 and C6-C7. Upper chest: The lung apices are clear. Thoracic inlet is within normal limits. Heterogeneous right thyroid lobe with a low-density lesion measuring less than 1 cm. Other: None IMPRESSION: No acute intracranial abnormality. Findings consistent with age related atrophy and chronic small vessel ischemia Soft tissue hematoma overlying the right parietal skull No acute fracture or malalignment of the spine. Electronically Signed   By: Prudencio Pair M.D.   On: 01/25/2019 23:55   CT Cervical Spine Wo Contrast  Result Date: 01/25/2019 CLINICAL DATA:  Fall down 3 steps EXAM: CT HEAD WITHOUT CONTRAST TECHNIQUE: Contiguous axial images were obtained from the base of the skull through the vertex without intravenous contrast. COMPARISON:  None. FINDINGS: Brain: No evidence of acute territorial infarction, hemorrhage, hydrocephalus,extra-axial collection or mass lesion/mass effect. There is dilatation the ventricles and sulci consistent with age-related atrophy. Low-attenuation changes in the deep white matter consistent with small vessel ischemia. Vascular: No hyperdense vessel or unexpected calcification. Skull: The skull is intact. No fracture or focal lesion identified. Sinuses/Orbits: The visualized paranasal sinuses and mastoid air cells are clear. The orbits and globes intact. Other: Soft tissue hematoma seen overlying the right parietal skull. Cervical spine: Alignment: Straightening of the normal cervical  lordosis. Skull base and vertebrae: Visualized skull base is intact. No atlanto-occipital dissociation. The vertebral body heights are well maintained. No fracture or pathologic osseous lesion seen. Large anterior osteophytes seen in the mid to lower cervical spine. Soft tissues and spinal canal: The visualized paraspinal soft tissues are unremarkable. No prevertebral soft tissue swelling is seen. The spinal canal is grossly unremarkable, no large epidural collection or significant canal narrowing. Disc levels: Cervical spine spondylosis is seen most notable at C5-C6 and C6-C7. Upper chest: The lung apices are clear. Thoracic inlet is within normal limits. Heterogeneous right thyroid lobe with a low-density lesion measuring less than 1 cm. Other: None IMPRESSION: No acute intracranial abnormality. Findings consistent with age related atrophy and chronic small vessel ischemia Soft tissue hematoma overlying the right parietal skull No acute fracture or malalignment of the spine. Electronically Signed   By: Ebony Cargo.D.  On: 01/25/2019 23:55   CT Abdomen Pelvis W Contrast  Result Date: 01/26/2019 CLINICAL DATA:  Diverticulitis suspected, fell down 3 steps EXAM: CT ABDOMEN AND PELVIS WITH CONTRAST TECHNIQUE: Multidetector CT imaging of the abdomen and pelvis was performed using the standard protocol following bolus administration of intravenous contrast. CONTRAST:  149mL OMNIPAQUE IOHEXOL 300 MG/ML  SOLN COMPARISON:  CT 05/18/2009 FINDINGS: Lower chest: Atelectatic changes seen dependently in the lung bases. Bandlike areas of opacity likely reflect subsegmental atelectasis and/or scarring. Cardiac size at the upper limits of normal. No pericardial effusion. Hepatobiliary: Subcentimeter hypoattenuating focus in segment 4 of the liver (3/16) too small to fully characterize on CT imaging but statistically likely benign. No worrisome focal liver abnormality is seen. No gallstones, gallbladder wall thickening, or  biliary dilatation. Pancreas: Unremarkable. No pancreatic ductal dilatation or surrounding inflammatory changes. Spleen: Normal in size without focal abnormality. No splenic injury or perisplenic hematoma. Adrenals/Urinary Tract: Partially exophytic 3.9 cm fluid attenuation cyst in the interpolar left kidney, increased in size from comparison CT but without worrisome features. Additional fluid attenuation cyst in the anterior interpolar right kidney measuring 1.2 cm in size few additional subcentimeter hypoattenuating foci too small to fully characterize on CT imaging but statistically likely benign. No worrisome renal lesions. No urolithiasis or hydronephrosis. Kidneys enhance and excrete symmetrically. There is circumferential bladder wall thickening with intraluminal gas. No loss of discernible fat plane with the adjacent inflamed bowel to suggest direct fistulization. Stomach/Bowel: Distal esophagus, stomach and duodenal sweep are unremarkable. No small bowel wall thickening or dilatation. Postsurgical changes of the right hemicolon likely reflecting prior hemicolectomy and ileocecal anastomosis. There is extensive distal colonic diverticulosis with focal thickening and phlegmon centered upon the distal sigmoid and multiple inflamed colonic diverticula with adjacent likely reactive free fluid. No extraluminal gas is seen. No organized collection or abscess. Vascular/Lymphatic: Reactive adenopathy in the pelvis. Atherosclerotic plaque within the normal caliber aorta. Major venous structures are unremarkable with some laminar filling artifact from the left iliac vein. Reproductive: Uterus is surgically absent. No concerning adnexal lesions. Other: Small volume free fluid in the pelvis with extensive pericolonic phlegmon and perivesicular stranding as well. Features are better detailed above. No extraluminal gas. No organized collection or abscess. Mild posterior body wall edema. No bowel containing hernias.  Musculoskeletal: Multilevel degenerative changes are present in the imaged portions of the spine. No acute osseous abnormality or suspicious osseous lesion. Additional degenerative changes noted in the hips. Remote left posterior tenth rib fracture. IMPRESSION: 1. Findings consistent with acute sigmoid diverticulitis without convincing evidence for perforation or abscess formation. Extensive phlegmon seen within the low pelvis. 2. Circumferential bladder wall thickening with intraluminal gas, findings are suspicious for a concurrent cystitis rather than simple reactive change. Correlate with urinalysis. No CT evidence for direct fistulization with the adjacent bowel. 3. No evidence of acute traumatic abnormality in the abdomen or pelvis. 4. Remote left tenth rib fracture. 5. Postsurgical changes of the right colon, correlate with surgical history. Prior hysterectomy. 6.  Aortic Atherosclerosis (ICD10-I70.0). Electronically Signed   By: Lovena Le M.D.   On: 01/26/2019 00:07    EKG: Independently reviewed.  Assessment/Plan Principal Problem:   Diverticulitis Active Problems:   Dementia ([place])   Parkinson's disease ([place])    1. Diverticultis with phlegmon - but no abscess per radiologist nor perf 1. Got zosyn in ED 2. Will switch to rocephin / flagyl 3. Clear liquid diet 2. Parkinson's disease - 1. Continue home med once med rec  completed 3. Dementia, HTN - 1. Continue home meds when med rec completed  DVT prophylaxis: Lovenox Code Status: Full Family Communication: No family in room, Husband will try and get med rec list for tomorrow AM Disposition Plan: Home after admit Consults called: None Admission status: Place in obs    Akai Dollard, [place] [facility]  How to contact the [facility] Attending or Consulting provider [place] or covering provider during after hours [place], for this patient?  1. Check the care team in [facility] and look for a) attending/consulting [facility] provider listed  and b) the [facility] team listed 2. Log into [email or web address]  Amion Physician Scheduling and messaging for groups and whole hospitals  On call and physician scheduling software for group practices, residents, hospitalists and other medical providers for call, clinic, rotation and shift schedules. On[employer] is a hospital-wide system for scheduling doctors and paging doctors on call. EasyPlot is for scientific plotting and data analysis.  [email or web address]  and use [place]'s universal password to access. If you do not have the password, please contact the hospital operator.  3. Locate the [facility] provider you are looking for under [facility] and page to a number that you can be directly reached. 4. If you still have difficulty reaching the provider, please page the [facility] (Director on Call) for the Hospitalists listed on amion for assistance.  01/26/2019, 1:47 AM

## 2019-01-26 NOTE — ED Notes (Signed)
Husband updated.

## 2019-01-26 NOTE — Progress Notes (Signed)
78 year old female who was admitted early morning after she was brought into the ED due to fall, head laceration and was subsequently diagnosed with acute diverticulitis.  Please see H&P for details.  Patient seen and examined.  She still complains of left lower quadrant abdominal pain.  Some headache.  No other complaint.  On examination, she has left lower quadrant abdominal tenderness.  Lungs clear to auscultation.  She has 2 staples at head laceration with no bleeding and healing well. Continue Rocephin and Flagyl for diverticulitis.  Blood pressure remained stable despite of holding her antihypertensives.  She is on lisinopril and hydrochlorothiazide and I will continue hold them and place her on as needed IV hydralazine.  Her med rec is still not completed.  I will resume her home medications as necessary once it is completed.

## 2019-01-26 NOTE — ED Notes (Signed)
Cleaned pt perineuml, placed brief over purewick and pt on all new bed linens with chux beneath. Told pt to call us when she has urinated so that we can get the specimen ordered

## 2019-01-26 NOTE — ED Provider Notes (Signed)
Care assumed from [facility], [place].  Please see her full H&P.  In short,  Katie Carrillo is a 78 y.o. female presents for fall.  Unknown if patient had syncope that created fall or not.  Patient hit her head while falling down 3 steps.  Asthma reports patient was unconscious 1 to 2 minutes.  She was not confused upon waking.  No seizure-like activity.  Patient does report she is had abdominal pain for the last 1-2 weeks and thinks it may be her diverticulitis.  Denies bloody bowels.  No vomiting.  On my evaluation of the patient, she reports she has been feeling poorly today with a significant increase in her abdominal pain.  She reports when they got ready to leave the house she felt a little woozy and then slipped on the stairs.  No chest pain or shortness of breath.  Physical Exam  BP 130/67   Pulse (!) 59   Temp 98.7 F (37.1 C) (Oral)   Resp 12   Ht 5\' 8"  (1.727 m)   Wt 86.2 kg   SpO2 93%   BMI 28.89 kg/m   Physical Exam Vitals and nursing note reviewed.  Constitutional:      General: She is not in acute distress.    Appearance: She is well-developed.  HENT:     Head: Normocephalic.     Comments: 2cm laceration to the posterior scalp Eyes:     General: No scleral icterus.    Conjunctiva/sclera: Conjunctivae normal.  Cardiovascular:     Rate and Rhythm: Normal rate.  Pulmonary:     Effort: Pulmonary effort is normal.  Abdominal:     Tenderness: There is abdominal tenderness ( generalized).  Musculoskeletal:        General: Normal range of motion.     Cervical back: Normal range of motion.  Skin:    General: Skin is warm and dry.  Neurological:     Mental Status: She is alert.     Results for orders placed or performed during the hospital encounter of 01/25/19  Urinalysis, Routine w reflex microscopic  Result Value Ref Range   Color, Urine YELLOW YELLOW   APPearance CLEAR CLEAR   Specific Gravity, Urine >1.046 (H) 1.005 - 1.030   pH 6.0 5.0 - 8.0    Glucose, UA NEGATIVE NEGATIVE mg/dL   Hgb urine dipstick NEGATIVE NEGATIVE   Bilirubin Urine NEGATIVE NEGATIVE   Ketones, ur NEGATIVE NEGATIVE mg/dL   Protein, ur NEGATIVE NEGATIVE mg/dL   Nitrite POSITIVE (A) NEGATIVE   Leukocytes,Ua NEGATIVE NEGATIVE   RBC / HPF 0-5 0 - 5 RBC/hpf   WBC, UA 0-5 0 - 5 WBC/hpf   Bacteria, UA NONE SEEN NONE SEEN  Comprehensive metabolic panel  Result Value Ref Range   Sodium 139 135 - 145 mmol/L   Potassium 3.7 3.5 - 5.1 mmol/L   Chloride 103 98 - 111 mmol/L   CO2 26 22 - 32 mmol/L   Glucose, Bld 141 (H) 70 - 99 mg/dL   BUN 16 8 - 23 mg/dL   Creatinine, Ser 0.91 0.44 - 1.00 mg/dL   Calcium 9.1 8.9 - 10.3 mg/dL   Total Protein 6.6 6.5 - 8.1 g/dL   Albumin 3.7 3.5 - 5.0 g/dL   AST 19 15 - 41 U/L   ALT 6 0 - 44 U/L   Alkaline Phosphatase 59 38 - 126 U/L   Total Bilirubin 0.9 0.3 - 1.2 mg/dL   GFR calc non Af  Amer >60 >60 mL/min   GFR calc Af Amer >60 >60 mL/min   Anion gap 10 5 - 15  Lipase, blood  Result Value Ref Range   Lipase 24 11 - 51 U/L  CBC with Differential  Result Value Ref Range   WBC 14.1 (H) 4.0 - 10.5 K/uL   RBC 4.44 3.87 - 5.11 MIL/uL   Hemoglobin 12.9 12.0 - 15.0 g/dL   HCT 41.8 36.0 - 46.0 %   MCV 94.1 80.0 - 100.0 fL   MCH 29.1 26.0 - 34.0 pg   MCHC 30.9 30.0 - 36.0 g/dL   RDW 14.8 11.5 - 15.5 %   Platelets 244 150 - 400 K/uL   nRBC 0.0 0.0 - 0.2 %   Neutrophils Relative % 83 %   Neutro Abs 11.7 (H) 1.7 - 7.7 K/uL   Lymphocytes Relative 10 %   Lymphs Abs 1.4 0.7 - 4.0 K/uL   Monocytes Relative 6 %   Monocytes Absolute 0.8 0.1 - 1.0 K/uL   Eosinophils Relative 0 %   Eosinophils Absolute 0.1 0.0 - 0.5 K/uL   Basophils Relative 0 %   Basophils Absolute 0.1 0.0 - 0.1 K/uL   Immature Granulocytes 1 %   Abs Immature Granulocytes 0.08 (H) 0.00 - 0.07 K/uL   DG Chest 2 View  Result Date: 01/25/2019 CLINICAL DATA:  Syncope EXAM: CHEST - 2 VIEW COMPARISON:  March 14, 2017 FINDINGS: The heart size and mediastinal  contours are within normal limits. Both lungs are clear. The visualized skeletal structures are unremarkable. IMPRESSION: No active cardiopulmonary disease. Electronically Signed   By: Prudencio Pair M.D.   On: 01/25/2019 22:13   CT Head Wo Contrast  Result Date: 01/25/2019 CLINICAL DATA:  Fall down 3 steps EXAM: CT HEAD WITHOUT CONTRAST TECHNIQUE: Contiguous axial images were obtained from the base of the skull through the vertex without intravenous contrast. COMPARISON:  None. FINDINGS: Brain: No evidence of acute territorial infarction, hemorrhage, hydrocephalus,extra-axial collection or mass lesion/mass effect. There is dilatation the ventricles and sulci consistent with age-related atrophy. Low-attenuation changes in the deep white matter consistent with small vessel ischemia. Vascular: No hyperdense vessel or unexpected calcification. Skull: The skull is intact. No fracture or focal lesion identified. Sinuses/Orbits: The visualized paranasal sinuses and mastoid air cells are clear. The orbits and globes intact. Other: Soft tissue hematoma seen overlying the right parietal skull. Cervical spine: Alignment: Straightening of the normal cervical lordosis. Skull base and vertebrae: Visualized skull base is intact. No atlanto-occipital dissociation. The vertebral body heights are well maintained. No fracture or pathologic osseous lesion seen. Large anterior osteophytes seen in the mid to lower cervical spine. Soft tissues and spinal canal: The visualized paraspinal soft tissues are unremarkable. No prevertebral soft tissue swelling is seen. The spinal canal is grossly unremarkable, no large epidural collection or significant canal narrowing. Disc levels: Cervical spine spondylosis is seen most notable at C5-C6 and C6-C7. Upper chest: The lung apices are clear. Thoracic inlet is within normal limits. Heterogeneous right thyroid lobe with a low-density lesion measuring less than 1 cm. Other: None IMPRESSION: No  acute intracranial abnormality. Findings consistent with age related atrophy and chronic small vessel ischemia Soft tissue hematoma overlying the right parietal skull No acute fracture or malalignment of the spine. Electronically Signed   By: Prudencio Pair M.D.   On: 01/25/2019 23:55   CT Cervical Spine Wo Contrast  Result Date: 01/25/2019 CLINICAL DATA:  Fall down 3 steps EXAM: CT HEAD WITHOUT  CONTRAST TECHNIQUE: Contiguous axial images were obtained from the base of the skull through the vertex without intravenous contrast. COMPARISON:  None. FINDINGS: Brain: No evidence of acute territorial infarction, hemorrhage, hydrocephalus,extra-axial collection or mass lesion/mass effect. There is dilatation the ventricles and sulci consistent with age-related atrophy. Low-attenuation changes in the deep white matter consistent with small vessel ischemia. Vascular: No hyperdense vessel or unexpected calcification. Skull: The skull is intact. No fracture or focal lesion identified. Sinuses/Orbits: The visualized paranasal sinuses and mastoid air cells are clear. The orbits and globes intact. Other: Soft tissue hematoma seen overlying the right parietal skull. Cervical spine: Alignment: Straightening of the normal cervical lordosis. Skull base and vertebrae: Visualized skull base is intact. No atlanto-occipital dissociation. The vertebral body heights are well maintained. No fracture or pathologic osseous lesion seen. Large anterior osteophytes seen in the mid to lower cervical spine. Soft tissues and spinal canal: The visualized paraspinal soft tissues are unremarkable. No prevertebral soft tissue swelling is seen. The spinal canal is grossly unremarkable, no large epidural collection or significant canal narrowing. Disc levels: Cervical spine spondylosis is seen most notable at C5-C6 and C6-C7. Upper chest: The lung apices are clear. Thoracic inlet is within normal limits. Heterogeneous right thyroid lobe with a  low-density lesion measuring less than 1 cm. Other: None IMPRESSION: No acute intracranial abnormality. Findings consistent with age related atrophy and chronic small vessel ischemia Soft tissue hematoma overlying the right parietal skull No acute fracture or malalignment of the spine. Electronically Signed   By: Prudencio Pair M.D.   On: 01/25/2019 23:55   CT Abdomen Pelvis W Contrast  Result Date: 01/26/2019 CLINICAL DATA:  Diverticulitis suspected, fell down 3 steps EXAM: CT ABDOMEN AND PELVIS WITH CONTRAST TECHNIQUE: Multidetector CT imaging of the abdomen and pelvis was performed using the standard protocol following bolus administration of intravenous contrast. CONTRAST:  117mL OMNIPAQUE IOHEXOL 300 MG/ML  SOLN COMPARISON:  CT 05/18/2009 FINDINGS: Lower chest: Atelectatic changes seen dependently in the lung bases. Bandlike areas of opacity likely reflect subsegmental atelectasis and/or scarring. Cardiac size at the upper limits of normal. No pericardial effusion. Hepatobiliary: Subcentimeter hypoattenuating focus in segment 4 of the liver (3/16) too small to fully characterize on CT imaging but statistically likely benign. No worrisome focal liver abnormality is seen. No gallstones, gallbladder wall thickening, or biliary dilatation. Pancreas: Unremarkable. No pancreatic ductal dilatation or surrounding inflammatory changes. Spleen: Normal in size without focal abnormality. No splenic injury or perisplenic hematoma. Adrenals/Urinary Tract: Partially exophytic 3.9 cm fluid attenuation cyst in the interpolar left kidney, increased in size from comparison CT but without worrisome features. Additional fluid attenuation cyst in the anterior interpolar right kidney measuring 1.2 cm in size few additional subcentimeter hypoattenuating foci too small to fully characterize on CT imaging but statistically likely benign. No worrisome renal lesions. No urolithiasis or hydronephrosis. Kidneys enhance and excrete  symmetrically. There is circumferential bladder wall thickening with intraluminal gas. No loss of discernible fat plane with the adjacent inflamed bowel to suggest direct fistulization. Stomach/Bowel: Distal esophagus, stomach and duodenal sweep are unremarkable. No small bowel wall thickening or dilatation. Postsurgical changes of the right hemicolon likely reflecting prior hemicolectomy and ileocecal anastomosis. There is extensive distal colonic diverticulosis with focal thickening and phlegmon centered upon the distal sigmoid and multiple inflamed colonic diverticula with adjacent likely reactive free fluid. No extraluminal gas is seen. No organized collection or abscess. Vascular/Lymphatic: Reactive adenopathy in the pelvis. Atherosclerotic plaque within the normal caliber aorta. Major venous  structures are unremarkable with some laminar filling artifact from the left iliac vein. Reproductive: Uterus is surgically absent. No concerning adnexal lesions. Other: Small volume free fluid in the pelvis with extensive pericolonic phlegmon and perivesicular stranding as well. Features are better detailed above. No extraluminal gas. No organized collection or abscess. Mild posterior body wall edema. No bowel containing hernias. Musculoskeletal: Multilevel degenerative changes are present in the imaged portions of the spine. No acute osseous abnormality or suspicious osseous lesion. Additional degenerative changes noted in the hips. Remote left posterior tenth rib fracture. IMPRESSION: 1. Findings consistent with acute sigmoid diverticulitis without convincing evidence for perforation or abscess formation. Extensive phlegmon seen within the low pelvis. 2. Circumferential bladder wall thickening with intraluminal gas, findings are suspicious for a concurrent cystitis rather than simple reactive change. Correlate with urinalysis. No CT evidence for direct fistulization with the adjacent bowel. 3. No evidence of acute  traumatic abnormality in the abdomen or pelvis. 4. Remote left tenth rib fracture. 5. Postsurgical changes of the right colon, correlate with surgical history. Prior hysterectomy. 6.  Aortic Atherosclerosis (ICD10-I70.0). Electronically Signed   By: Lovena Le M.D.   On: 01/26/2019 00:07     ED Course/Procedures   Clinical Course as of Jan 25 301  Thu Jan 25, 2019  2226 Consistent with baseline of 94%  SpO2: 93 % [EH]  2230 Plan: Patient pending CT scan head and neck along with abdominal scans.  If head and neck are within normal limits, will reassess laceration to the back of her head.  Dispo per CT scan results.   [HM]  Fri Jan 26, 2019  0122 Removed.  Full range of motion of her neck without pain.  No midline or paraspinal tenderness.   [HM]  0123 Evaluation of the scalp hematoma.  2 cm laceration noted.  Will repair.   [HM]  0219 Discussed with Dr. Alcario Drought who will admit.   [HM]  0302 Increase specific gravity and nitrite positive but no clear indication for urinary tract infection.  She is receiving Zosyn which will cover for possible UTI.  Urine culture sent.  Specific Gravity, Urine(!): >1.046 [HM]    Clinical Course User Index [EH] Lorin Glass, PA-C [HM] Shatia Sindoni, Jarrett Soho, PA-C    .Marland KitchenLaceration Repair  Date/Time: 01/26/2019 1:28 AM Performed by: Abigail Butts, PA-C Authorized by: Abigail Butts, PA-C   Consent:    Consent obtained:  Verbal   Consent given by:  Patient   Risks discussed:  Infection, need for additional repair, pain, poor cosmetic result and poor wound healing   Alternatives discussed:  No treatment and delayed treatment Universal protocol:    Procedure explained and questions answered to patient or proxy's satisfaction: yes     Relevant documents present and verified: yes     Test results available and properly labeled: yes     Imaging studies available: yes     Required blood products, implants, devices, and special  equipment available: yes     Site/side marked: yes     Immediately prior to procedure, a time out was called: yes     Patient identity confirmed:  Verbally with patient Anesthesia (see MAR for exact dosages):    Anesthesia method:  Local infiltration   Local anesthetic:  Lidocaine 2% WITH epi Laceration details:    Location:  Scalp   Length (cm):  2 Repair type:    Repair type:  Simple Exploration:    Hemostasis achieved with:  Direct pressure and epinephrine  Wound exploration: entire depth of wound probed and visualized   Treatment:    Area cleansed with:  Saline   Amount of cleaning:  Standard   Irrigation solution:  Sterile water   Irrigation method:  Syringe Skin repair:    Repair method:  Staples   Number of staples:  2 Approximation:    Approximation:  Close Post-procedure details:    Dressing:  Open (no dressing)   Patient tolerance of procedure:  Tolerated well, no immediate complications    MDM   Patient presents after fall.  Questionable syncopal episode.  CT head and neck are without acute abnormality.  CT scan of her abdomen shows significant diverticulitis with phlegmon.  No clear evidence of bowel perforation.  Additionally concern for possible cystitis.  Personally evaluated these images.  On further evaluation, patient reports she has had urinary frequency all day today.  Denies dysuria, hematuria or foul odor.  She reports she has been feeling badly for approximately a week but felt much worse today.  Given findings of CT scan, she will need to be admitted for IV antibiotics.  Will utilize Zosyn for likely UTI and diverticulitis.  Scalp laceration repaired.   Fall, initial encounter  Injury of head, initial encounter  Laceration of scalp, initial encounter  Diverticulitis  Urinary tract infection without hematuria, site unspecified        Abigail Butts, PA-C 01/26/19 0302    Ward, Delice Bison, DO 01/26/19 0401

## 2019-01-27 LAB — CBC WITH DIFFERENTIAL/PLATELET
Abs Immature Granulocytes: 0.04 10*3/uL (ref 0.00–0.07)
Basophils Absolute: 0.1 10*3/uL (ref 0.0–0.1)
Basophils Relative: 1 %
Eosinophils Absolute: 0.1 10*3/uL (ref 0.0–0.5)
Eosinophils Relative: 1 %
HCT: 34.7 % — ABNORMAL LOW (ref 36.0–46.0)
Hemoglobin: 11.3 g/dL — ABNORMAL LOW (ref 12.0–15.0)
Immature Granulocytes: 0 %
Lymphocytes Relative: 11 %
Lymphs Abs: 1.1 10*3/uL (ref 0.7–4.0)
MCH: 29.7 pg (ref 26.0–34.0)
MCHC: 32.6 g/dL (ref 30.0–36.0)
MCV: 91.1 fL (ref 80.0–100.0)
Monocytes Absolute: 0.7 10*3/uL (ref 0.1–1.0)
Monocytes Relative: 7 %
Neutro Abs: 8.4 10*3/uL — ABNORMAL HIGH (ref 1.7–7.7)
Neutrophils Relative %: 80 %
Platelets: 219 10*3/uL (ref 150–400)
RBC: 3.81 MIL/uL — ABNORMAL LOW (ref 3.87–5.11)
RDW: 14.9 % (ref 11.5–15.5)
WBC: 10.4 10*3/uL (ref 4.0–10.5)
nRBC: 0 % (ref 0.0–0.2)

## 2019-01-27 LAB — BASIC METABOLIC PANEL
Anion gap: 11 (ref 5–15)
BUN: 12 mg/dL (ref 8–23)
CO2: 25 mmol/L (ref 22–32)
Calcium: 8.5 mg/dL — ABNORMAL LOW (ref 8.9–10.3)
Chloride: 103 mmol/L (ref 98–111)
Creatinine, Ser: 0.79 mg/dL (ref 0.44–1.00)
GFR calc Af Amer: >60 mL/min (ref >60)
GFR calc non Af Amer: >60 mL/min (ref >60)
Glucose, Bld: 157 mg/dL — ABNORMAL HIGH (ref 70–99)
Potassium: 3.4 mmol/L — ABNORMAL LOW (ref 3.5–5.1)
Sodium: 139 mmol/L (ref 135–145)

## 2019-01-27 LAB — URINE CULTURE: Culture: <10000 — AB

## 2019-01-27 MED ORDER — CARBIDOPA-LEVODOPA ER 25-100 MG PO TBCR
1.0000 | EXTENDED_RELEASE_TABLET | Freq: Two times a day (BID) | ORAL | Status: DC
  Administered 2019-01-27 – 2019-01-28 (×3): 1 via ORAL
  Filled 2019-01-27 (×5): qty 1

## 2019-01-27 NOTE — Plan of Care (Signed)
  Problem: Pain Managment: Goal: General experience of comfort will improve Outcome: Progressing   Problem: Safety: Goal: Ability to remain free from injury will improve Outcome: Progressing   Problem: Skin Integrity: Goal: Risk for impaired skin integrity will decrease Outcome: Progressing   

## 2019-01-27 NOTE — Progress Notes (Signed)
PROGRESS NOTE    Katie Carrillo  [id number] DOB: 09-06-1940 DOA: 01/25/2019 PCP: Lawerance Cruel, MD   Brief Narrative:  HPI: Katie Carrillo is a 78 y.o. female with medical history significant of dementia, HTN, parkinsons dz, prior diverticulitis.  Has been having abd pain for past 1.5 weeks or so that she thinks (correctly) is her diverticulitis.  Not as active today.  Slipped and hit head while going down 3 stairs.   ED Course: Other than head lac, no intracranial injury, CT head and c spine are clear.  Does have WBC of 14k.  And CT abd/pelvis does indeed confirm diverticulitis with extensive phlegmon.  No obvious perf nor abscess though per radiologist.  Assessment & Plan:   Principal Problem:   Diverticulitis Active Problems:   Dementia ([place])   Parkinson's disease ([place])   Acute diverticulitis  Acute diverticulitis: Continues to have abdominal pain, slightly improved and tenderness improved as well.  Tolerating full liquid diet.  Will advance to soft diet and continue current antibiotics.  C. difficile ruled out.  Parkinson's disease: Reportedly, she was recently started on some medications.  We are still waiting for her husband to bring the name of their medications so we can resume them.  I personally called him and left a voicemail.  Essential hypertension: Controlled.  Continue lisinopril and hydrochlorothiazide.  Dementia: Continue Aricept.  Hypokalemia: 3.4.  Replaced.  Recheck in the morning.  DVT prophylaxis: Lovenox Code Status: Full code Family Communication:  None present at bedside.  Plan of care discussed with patient in length and he verbalized understanding and agreed with it. Disposition Plan: Potential discharge tomorrow.  Consulted PT OT today.  Estimated body mass index is 28.89 kg/m as calculated from the following:   Height as of this encounter: 5\' 8"  (1.727 m).   Weight as of this encounter: 86.2 kg.      Nutritional status:                 Consultants:   None  Procedures:   None  Antimicrobials:   Rocephin and Flagyl   Subjective: Seen and examined.  Continues to have left lower quadrant pain but improved.  No other complaint.  Tolerating liquid diet.  Objective: Vitals:   01/26/19 0439 01/26/19 1526 01/26/19 2050 01/27/19 0432  BP: (!) 142/74 (!) 152/80 140/70 (!) 102/47  Pulse: 61 81 79 65  Resp: 18 18 14 20   Temp: 98.4 F (36.9 C) 98.2 F (36.8 C) 98.7 F (37.1 C) 98.6 F (37 C)  TempSrc: Oral Oral Oral   SpO2: 99% 93% 99% 96%  Weight:      Height:        Intake/Output Summary (Last 24 hours) at 01/27/2019 1400 Last data filed at 01/27/2019 [id number] Gross per 24 hour  Intake 800 ml  Output 450 ml  Net 350 ml   Filed Weights   01/25/19 2120  Weight: 86.2 kg    Examination:  General exam: Appears calm and comfortable  Respiratory system: Clear to auscultation. Respiratory effort normal. Cardiovascular system: S1 & S2 heard, RRR. No JVD, murmurs, rubs, gallops or clicks. No pedal edema. Gastrointestinal system: Abdomen is nondistended, soft and left lower quadrant tenderness. No organomegaly or masses felt. Normal bowel sounds heard. Central nervous system: Alert and oriented. No focal neurological deficits. Extremities: Symmetric 5 x 5 power. Skin: No rashes, lesions or ulcers Psychiatry: Judgement and insight appear normal. Mood & affect appropriate.    Data Reviewed: I  have personally reviewed following labs and imaging studies  CBC: Recent Labs  Lab 01/25/19 2231 01/26/19 0319 01/27/19 0837  WBC 14.1* 12.1* 10.4  NEUTROABS 11.7*  --  8.4*  HGB 12.9 12.0 11.3*  HCT 41.8 37.3 34.7*  MCV 94.1 90.5 91.1  PLT 244 208 [id number]   Basic Metabolic Panel: Recent Labs  Lab 01/25/19 2231 01/26/19 0319 01/27/19 0837  NA 139 140 139  K 3.7 3.5 3.4*  CL 103 102 103  CO2 26 29 25   GLUCOSE 141* 119* 157*  BUN 16 14 12   CREATININE 0.91 0.74 0.79  CALCIUM 9.1 8.9 8.5*    GFR: Estimated Creatinine Clearance: 67.7 mL/min (by C-G formula based on SCr of 0.79 mg/dL). Liver Function Tests: Recent Labs  Lab 01/25/19 2231  AST 19  ALT 6  ALKPHOS 59  BILITOT 0.9  PROT 6.6  ALBUMIN 3.7   Recent Labs  Lab 01/25/19 2231  LIPASE 24   No results for input(s): AMMONIA in the last 168 hours. Coagulation Profile: No results for input(s): INR, PROTIME in the last 168 hours. Cardiac Enzymes: No results for input(s): CKTOTAL, CKMB, CKMBINDEX, TROPONINI in the last 168 hours. BNP (last 3 results) No results for input(s): PROBNP in the last 8760 hours. HbA1C: No results for input(s): HGBA1C in the last 72 hours. CBG: No results for input(s): GLUCAP in the last 168 hours. Lipid Profile: No results for input(s): CHOL, HDL, LDLCALC, TRIG, CHOLHDL, LDLDIRECT in the last 72 hours. Thyroid Function Tests: No results for input(s): TSH, T4TOTAL, FREET4, T3FREE, THYROIDAB in the last 72 hours. Anemia Panel: No results for input(s): VITAMINB12, FOLATE, FERRITIN, TIBC, IRON, RETICCTPCT in the last 72 hours. Sepsis Labs: No results for input(s): PROCALCITON, LATICACIDVEN in the last 168 hours.  Recent Results (from the past 240 hour(s))  SARS CORONAVIRUS 2 (TAT 6-24 HRS) Nasopharyngeal Nasopharyngeal Swab     Status: None   Collection Time: 01/26/19  1:59 AM   Specimen: Nasopharyngeal Swab  Result Value Ref Range Status   SARS Coronavirus 2 NEGATIVE NEGATIVE Final    Comment: (NOTE) SARS-CoV-2 target nucleic acids are NOT DETECTED. The SARS-CoV-2 RNA is generally detectable in upper and lower respiratory specimens during the acute phase of infection. Negative results do not preclude SARS-CoV-2 infection, do not rule out co-infections with other pathogens, and should not be used as the sole basis for treatment or other patient management decisions. Negative results must be combined with clinical observations, patient history, and epidemiological information.  The expected result is Negative. Fact Sheet for Patients: SugarRoll.be Fact Sheet for Healthcare Providers: https://www.woods-mathews.com/ This test is not yet approved or cleared by the [place] FDA and  has been authorized for detection and/or diagnosis of SARS-CoV-2 by FDA under an Emergency Use Authorization (EUA). This EUA will remain  in effect (meaning this test can be used) for the duration of the COVID-19 declaration under Section 56 4(b)(1) of the Act, 21 U.S.C. section 360bbb-3(b)(1), unless the authorization is terminated or revoked sooner. Performed at [facility], [place] [address], [place], [place] [address]   Urine culture     Status: Abnormal   Collection Time: 01/26/19  6:00 AM   Specimen: Urine, Clean Catch  Result Value Ref Range Status   Specimen Description URINE, CLEAN CATCH  Final   Special Requests NONE  Final   Culture (A)  Final    <10,000 COLONIES/mL INSIGNIFICANT GROWTH Performed at [facility], [place] [address], [place], [place]  [id number]    Report Status 01/27/2019 FINAL  Final  C difficile quick scan w PCR reflex     Status: None   Collection Time: 01/26/19  3:57 PM   Specimen: STOOL  Result Value Ref Range Status   C Diff antigen NEGATIVE NEGATIVE Final   C Diff toxin NEGATIVE NEGATIVE Final   C Diff interpretation No C. difficile detected.  Final    Comment: Performed at [facility], [place] [address] [place], [place] [address]      Radiology Studies: DG Chest 2 View  Result Date: 01/25/2019 CLINICAL DATA:  Syncope EXAM: CHEST - 2 VIEW COMPARISON:  March 14, 2017 FINDINGS: The heart size and mediastinal contours are within normal limits. Both lungs are clear. The visualized skeletal structures are unremarkable. IMPRESSION: No active cardiopulmonary disease. Electronically Signed   By: Prudencio Pair M.D.   On: 01/25/2019 22:13   CT Head Wo Contrast  Result Date:  01/25/2019 CLINICAL DATA:  Fall down 3 steps EXAM: CT HEAD WITHOUT CONTRAST TECHNIQUE: Contiguous axial images were obtained from the base of the skull through the vertex without intravenous contrast. COMPARISON:  None. FINDINGS: Brain: No evidence of acute territorial infarction, hemorrhage, hydrocephalus,extra-axial collection or mass lesion/mass effect. There is dilatation the ventricles and sulci consistent with age-related atrophy. Low-attenuation changes in the deep white matter consistent with small vessel ischemia. Vascular: No hyperdense vessel or unexpected calcification. Skull: The skull is intact. No fracture or focal lesion identified. Sinuses/Orbits: The visualized paranasal sinuses and mastoid air cells are clear. The orbits and globes intact. Other: Soft tissue hematoma seen overlying the right parietal skull. Cervical spine: Alignment: Straightening of the normal cervical lordosis. Skull base and vertebrae: Visualized skull base is intact. No atlanto-occipital dissociation. The vertebral body heights are well maintained. No fracture or pathologic osseous lesion seen. Large anterior osteophytes seen in the mid to lower cervical spine. Soft tissues and spinal canal: The visualized paraspinal soft tissues are unremarkable. No prevertebral soft tissue swelling is seen. The spinal canal is grossly unremarkable, no large epidural collection or significant canal narrowing. Disc levels: Cervical spine spondylosis is seen most notable at C5-C6 and C6-C7. Upper chest: The lung apices are clear. Thoracic inlet is within normal limits. Heterogeneous right thyroid lobe with a low-density lesion measuring less than 1 cm. Other: None IMPRESSION: No acute intracranial abnormality. Findings consistent with age related atrophy and chronic small vessel ischemia Soft tissue hematoma overlying the right parietal skull No acute fracture or malalignment of the spine. Electronically Signed   By: Prudencio Pair M.D.   On:  01/25/2019 23:55   CT Cervical Spine Wo Contrast  Result Date: 01/25/2019 CLINICAL DATA:  Fall down 3 steps EXAM: CT HEAD WITHOUT CONTRAST TECHNIQUE: Contiguous axial images were obtained from the base of the skull through the vertex without intravenous contrast. COMPARISON:  None. FINDINGS: Brain: No evidence of acute territorial infarction, hemorrhage, hydrocephalus,extra-axial collection or mass lesion/mass effect. There is dilatation the ventricles and sulci consistent with age-related atrophy. Low-attenuation changes in the deep white matter consistent with small vessel ischemia. Vascular: No hyperdense vessel or unexpected calcification. Skull: The skull is intact. No fracture or focal lesion identified. Sinuses/Orbits: The visualized paranasal sinuses and mastoid air cells are clear. The orbits and globes intact. Other: Soft tissue hematoma seen overlying the right parietal skull. Cervical spine: Alignment: Straightening of the normal cervical lordosis. Skull base and vertebrae: Visualized skull base is intact. No atlanto-occipital dissociation. The vertebral body heights are  well maintained. No fracture or pathologic osseous lesion seen. Large anterior osteophytes seen in the mid to lower cervical spine. Soft tissues and spinal canal: The visualized paraspinal soft tissues are unremarkable. No prevertebral soft tissue swelling is seen. The spinal canal is grossly unremarkable, no large epidural collection or significant canal narrowing. Disc levels: Cervical spine spondylosis is seen most notable at C5-C6 and C6-C7. Upper chest: The lung apices are clear. Thoracic inlet is within normal limits. Heterogeneous right thyroid lobe with a low-density lesion measuring less than 1 cm. Other: None IMPRESSION: No acute intracranial abnormality. Findings consistent with age related atrophy and chronic small vessel ischemia Soft tissue hematoma overlying the right parietal skull No acute fracture or malalignment  of the spine. Electronically Signed   By: Prudencio Pair M.D.   On: 01/25/2019 23:55   CT Abdomen Pelvis W Contrast  Result Date: 01/26/2019 CLINICAL DATA:  Diverticulitis suspected, fell down 3 steps EXAM: CT ABDOMEN AND PELVIS WITH CONTRAST TECHNIQUE: Multidetector CT imaging of the abdomen and pelvis was performed using the standard protocol following bolus administration of intravenous contrast. CONTRAST:  170mL OMNIPAQUE IOHEXOL 300 MG/ML  SOLN COMPARISON:  CT 05/18/2009 FINDINGS: Lower chest: Atelectatic changes seen dependently in the lung bases. Bandlike areas of opacity likely reflect subsegmental atelectasis and/or scarring. Cardiac size at the upper limits of normal. No pericardial effusion. Hepatobiliary: Subcentimeter hypoattenuating focus in segment 4 of the liver (3/16) too small to fully characterize on CT imaging but statistically likely benign. No worrisome focal liver abnormality is seen. No gallstones, gallbladder wall thickening, or biliary dilatation. Pancreas: Unremarkable. No pancreatic ductal dilatation or surrounding inflammatory changes. Spleen: Normal in size without focal abnormality. No splenic injury or perisplenic hematoma. Adrenals/Urinary Tract: Partially exophytic 3.9 cm fluid attenuation cyst in the interpolar left kidney, increased in size from comparison CT but without worrisome features. Additional fluid attenuation cyst in the anterior interpolar right kidney measuring 1.2 cm in size few additional subcentimeter hypoattenuating foci too small to fully characterize on CT imaging but statistically likely benign. No worrisome renal lesions. No urolithiasis or hydronephrosis. Kidneys enhance and excrete symmetrically. There is circumferential bladder wall thickening with intraluminal gas. No loss of discernible fat plane with the adjacent inflamed bowel to suggest direct fistulization. Stomach/Bowel: Distal esophagus, stomach and duodenal sweep are unremarkable. No small bowel  wall thickening or dilatation. Postsurgical changes of the right hemicolon likely reflecting prior hemicolectomy and ileocecal anastomosis. There is extensive distal colonic diverticulosis with focal thickening and phlegmon centered upon the distal sigmoid and multiple inflamed colonic diverticula with adjacent likely reactive free fluid. No extraluminal gas is seen. No organized collection or abscess. Vascular/Lymphatic: Reactive adenopathy in the pelvis. Atherosclerotic plaque within the normal caliber aorta. Major venous structures are unremarkable with some laminar filling artifact from the left iliac vein. Reproductive: Uterus is surgically absent. No concerning adnexal lesions. Other: Small volume free fluid in the pelvis with extensive pericolonic phlegmon and perivesicular stranding as well. Features are better detailed above. No extraluminal gas. No organized collection or abscess. Mild posterior body wall edema. No bowel containing hernias. Musculoskeletal: Multilevel degenerative changes are present in the imaged portions of the spine. No acute osseous abnormality or suspicious osseous lesion. Additional degenerative changes noted in the hips. Remote left posterior tenth rib fracture. IMPRESSION: 1. Findings consistent with acute sigmoid diverticulitis without convincing evidence for perforation or abscess formation. Extensive phlegmon seen within the low pelvis. 2. Circumferential bladder wall thickening with intraluminal gas, findings are suspicious for  a concurrent cystitis rather than simple reactive change. Correlate with urinalysis. No CT evidence for direct fistulization with the adjacent bowel. 3. No evidence of acute traumatic abnormality in the abdomen or pelvis. 4. Remote left tenth rib fracture. 5. Postsurgical changes of the right colon, correlate with surgical history. Prior hysterectomy. 6.  Aortic Atherosclerosis (ICD10-I70.0). Electronically Signed   By: Lovena Le M.D.   On: 01/26/2019  00:07    Scheduled Meds: . donepezil  10 mg Oral QHS  . enoxaparin (LOVENOX) injection  40 mg Subcutaneous Q24H  . hydrochlorothiazide  12.5 mg Oral Daily  . lisinopril  10 mg Oral Daily  . oxybutynin  5 mg Oral BID  . tamsulosin  0.4 mg Oral Daily   Continuous Infusions: . cefTRIAXone (ROCEPHIN)  IV 2 g (01/27/19 0803)  . metronidazole 500 mg (01/27/19 0857)     LOS: 1 day   Time spent: 30 minutes   Darliss Cheney, MD [facility]  01/27/2019, 2:00 PM   To contact the attending provider between 7A-7P or the covering provider during after hours 7P-7A, please log into the web site [email or web address] and use password [email or web address].

## 2019-01-28 LAB — CBC WITH DIFFERENTIAL/PLATELET
Abs Immature Granulocytes: 0.05 10*3/uL (ref 0.00–0.07)
Basophils Absolute: 0 10*3/uL (ref 0.0–0.1)
Basophils Relative: 0 %
Eosinophils Absolute: 0.1 10*3/uL (ref 0.0–0.5)
Eosinophils Relative: 1 %
HCT: 35.4 % — ABNORMAL LOW (ref 36.0–46.0)
Hemoglobin: 11.7 g/dL — ABNORMAL LOW (ref 12.0–15.0)
Immature Granulocytes: 1 %
Lymphocytes Relative: 16 %
Lymphs Abs: 1.8 10*3/uL (ref 0.7–4.0)
MCH: 30.4 pg (ref 26.0–34.0)
MCHC: 33.1 g/dL (ref 30.0–36.0)
MCV: 91.9 fL (ref 80.0–100.0)
Monocytes Absolute: 0.8 10*3/uL (ref 0.1–1.0)
Monocytes Relative: 7 %
Neutro Abs: 8.2 10*3/uL — ABNORMAL HIGH (ref 1.7–7.7)
Neutrophils Relative %: 75 %
Platelets: 213 10*3/uL (ref 150–400)
RBC: 3.85 MIL/uL — ABNORMAL LOW (ref 3.87–5.11)
RDW: 14.9 % (ref 11.5–15.5)
WBC: 10.9 10*3/uL — ABNORMAL HIGH (ref 4.0–10.5)
nRBC: 0 % (ref 0.0–0.2)

## 2019-01-28 LAB — BASIC METABOLIC PANEL
Anion gap: 11 (ref 5–15)
BUN: 12 mg/dL (ref 8–23)
CO2: 25 mmol/L (ref 22–32)
Calcium: 8.6 mg/dL — ABNORMAL LOW (ref 8.9–10.3)
Chloride: 104 mmol/L (ref 98–111)
Creatinine, Ser: 0.77 mg/dL (ref 0.44–1.00)
GFR calc Af Amer: >60 mL/min (ref >60)
GFR calc non Af Amer: >60 mL/min (ref >60)
Glucose, Bld: 112 mg/dL — ABNORMAL HIGH (ref 70–99)
Potassium: 3.4 mmol/L — ABNORMAL LOW (ref 3.5–5.1)
Sodium: 140 mmol/L (ref 135–145)

## 2019-01-28 MED ORDER — CARBIDOPA-LEVODOPA 25-100 MG PO TABS
1.0000 | ORAL_TABLET | Freq: Three times a day (TID) | ORAL | Status: DC
  Administered 2019-01-28 – 2019-01-29 (×3): 1 via ORAL
  Filled 2019-01-28 (×3): qty 1

## 2019-01-28 MED ORDER — POTASSIUM CHLORIDE CRYS ER 20 MEQ PO TBCR
40.0000 meq | EXTENDED_RELEASE_TABLET | ORAL | Status: AC
  Administered 2019-01-28 (×2): 40 meq via ORAL
  Filled 2019-01-28 (×2): qty 2

## 2019-01-28 NOTE — Progress Notes (Signed)
PROGRESS NOTE    Katie Carrillo  [id number] DOB: February 23, 1940 DOA: 01/25/2019 PCP: Lawerance Cruel, MD   Brief Narrative:  HPI: Katie Carrillo is a 78 y.o. female with medical history significant of dementia, HTN, parkinsons dz, prior diverticulitis.  Has been having abd pain for past 1.5 weeks or so that she thinks (correctly) is her diverticulitis.  Not as active today.  Slipped and hit head while going down 3 stairs.   ED Course: Other than head lac, no intracranial injury, CT head and c spine are clear.  Does have WBC of 14k.  And CT abd/pelvis does indeed confirm diverticulitis with extensive phlegmon.  No obvious perf nor abscess though per radiologist.  Assessment & Plan:   Principal Problem:   Diverticulitis Active Problems:   Dementia ([place])   Parkinson's disease ([place])   Acute diverticulitis  Acute diverticulitis: Continues to have abdominal pain, although it is improving but very slowly.  No nausea, diarrhea or any other complaint.  Tolerating soft diet.  Will advance to regular diet.  And give her 1 more day.  Parkinson's disease: She told the nurse that she takes Sinemet 3 times a day.  Not sure if this is accurate.  I have started her on twice daily.  Essential hypertension: Controlled.  Continue lisinopril and hydrochlorothiazide.  Dementia: Continue Aricept.  Hypokalemia: 3.4 once again.  Will replace today.  Recheck in the morning.  DVT prophylaxis: Lovenox Code Status: Full code Family Communication:  None present at bedside.  Called and spoke to her husband.  He was also in favor of keeping her overnight due to her pain. Disposition Plan: Seen by PT OT.  They recommend home health.  This is ordered.  Potential discharge tomorrow.  Estimated body mass index is 28.89 kg/m as calculated from the following:   Height as of this encounter: 5\' 8"  (1.727 m).   Weight as of this encounter: 86.2 kg.      Nutritional status:                Consultants:   None  Procedures:   None  Antimicrobials:   Rocephin and Flagyl   Subjective: Seen and examined.  Continues to have left lower quadrant pain although improving.  No other complaint.  Objective: Vitals:   01/27/19 0432 01/27/19 1520 01/27/19 2041 01/28/19 0425  BP: (!) 102/47 (!) 106/52 123/66 124/60  Pulse: 65 67 75 62  Resp: 20 18 16 16   Temp: 98.6 F (37 C) 98 F (36.7 C) 98.7 F (37.1 C) 98.2 F (36.8 C)  TempSrc:  Oral Oral Oral  SpO2: 96% 94% 93% 91%  Weight:      Height:        Intake/Output Summary (Last 24 hours) at 01/28/2019 1323 Last data filed at 01/28/2019 1025 Gross per 24 hour  Intake 1140 ml  Output 1300 ml  Net -160 ml   Filed Weights   01/25/19 2120  Weight: 86.2 kg    Examination:  General exam: Appears calm and comfortable  Respiratory system: Clear to auscultation. Respiratory effort normal. Cardiovascular system: S1 & S2 heard, RRR. No JVD, murmurs, rubs, gallops or clicks. No pedal edema. Gastrointestinal system: Abdomen is nondistended, soft and left lower quadrant abdominal tenderness. No organomegaly or masses felt. Normal bowel sounds heard. Central nervous system: Alert and oriented. No focal neurological deficits. Extremities: Symmetric 5 x 5 power. Skin: No rashes, lesions or ulcers.  Psychiatry: Judgement and insight appear normal. Mood &  affect appropriate.   Data Reviewed: I have personally reviewed following labs and imaging studies  CBC: Recent Labs  Lab 01/25/19 2231 01/26/19 0319 01/27/19 0837 01/28/19 0314  WBC 14.1* 12.1* 10.4 10.9*  NEUTROABS 11.7*  --  8.4* 8.2*  HGB 12.9 12.0 11.3* 11.7*  HCT 41.8 37.3 34.7* 35.4*  MCV 94.1 90.5 91.1 91.9  PLT 244 208 219 [id number]   Basic Metabolic Panel: Recent Labs  Lab 01/25/19 2231 01/26/19 0319 01/27/19 0837 01/28/19 0314  NA 139 140 139 140  K 3.7 3.5 3.4* 3.4*  CL 103 102 103 104  CO2 26 29 25 25   GLUCOSE 141* 119* 157* 112*  BUN 16  14 12 12   CREATININE 0.91 0.74 0.79 0.77  CALCIUM 9.1 8.9 8.5* 8.6*   GFR: Estimated Creatinine Clearance: 67.7 mL/min (by C-G formula based on SCr of 0.77 mg/dL). Liver Function Tests: Recent Labs  Lab 01/25/19 2231  AST 19  ALT 6  ALKPHOS 59  BILITOT 0.9  PROT 6.6  ALBUMIN 3.7   Recent Labs  Lab 01/25/19 2231  LIPASE 24   No results for input(s): AMMONIA in the last 168 hours. Coagulation Profile: No results for input(s): INR, PROTIME in the last 168 hours. Cardiac Enzymes: No results for input(s): CKTOTAL, CKMB, CKMBINDEX, TROPONINI in the last 168 hours. BNP (last 3 results) No results for input(s): PROBNP in the last 8760 hours. HbA1C: No results for input(s): HGBA1C in the last 72 hours. CBG: No results for input(s): GLUCAP in the last 168 hours. Lipid Profile: No results for input(s): CHOL, HDL, LDLCALC, TRIG, CHOLHDL, LDLDIRECT in the last 72 hours. Thyroid Function Tests: No results for input(s): TSH, T4TOTAL, FREET4, T3FREE, THYROIDAB in the last 72 hours. Anemia Panel: No results for input(s): VITAMINB12, FOLATE, FERRITIN, TIBC, IRON, RETICCTPCT in the last 72 hours. Sepsis Labs: No results for input(s): PROCALCITON, LATICACIDVEN in the last 168 hours.  Recent Results (from the past 240 hour(s))  SARS CORONAVIRUS 2 (TAT 6-24 HRS) Nasopharyngeal Nasopharyngeal Swab     Status: None   Collection Time: 01/26/19  1:59 AM   Specimen: Nasopharyngeal Swab  Result Value Ref Range Status   SARS Coronavirus 2 NEGATIVE NEGATIVE Final    Comment: (NOTE) SARS-CoV-2 target nucleic acids are NOT DETECTED. The SARS-CoV-2 RNA is generally detectable in upper and lower respiratory specimens during the acute phase of infection. Negative results do not preclude SARS-CoV-2 infection, do not rule out co-infections with other pathogens, and should not be used as the sole basis for treatment or other patient management decisions. Negative results must be combined with  clinical observations, patient history, and epidemiological information. The expected result is Negative. Fact Sheet for Patients: SugarRoll.be Fact Sheet for Healthcare Providers: https://www.woods-mathews.com/ This test is not yet approved or cleared by the [place] FDA and  has been authorized for detection and/or diagnosis of SARS-CoV-2 by FDA under an Emergency Use Authorization (EUA). This EUA will remain  in effect (meaning this test can be used) for the duration of the COVID-19 declaration under Section 56 4(b)(1) of the Act, 21 U.S.C. section 360bbb-3(b)(1), unless the authorization is terminated or revoked sooner. Performed at [facility], [place] [address] [place],  [address]   Urine culture     Status: Abnormal   Collection Time: 01/26/19  6:00 AM   Specimen: Urine, Clean Catch  Result Value Ref Range Status   Specimen Description URINE, CLEAN CATCH  Final   Special Requests NONE  Final  Culture (A)  Final    <10,000 COLONIES/mL INSIGNIFICANT GROWTH Performed at [place] [address], [place], [place] [address]    Report Status 01/27/2019 FINAL  Final  C difficile quick scan w PCR reflex     Status: None   Collection Time: 01/26/19  3:57 PM   Specimen: STOOL  Result Value Ref Range Status   C Diff antigen NEGATIVE NEGATIVE Final   C Diff toxin NEGATIVE NEGATIVE Final   C Diff interpretation No C. difficile detected.  Final    Comment: Performed at [facility], [place] [address] [place], [place] [address]      Radiology Studies: No results found.  Scheduled Meds: . Carbidopa-Levodopa ER  1 tablet Oral BID  . donepezil  10 mg Oral QHS  . enoxaparin (LOVENOX) injection  40 mg Subcutaneous Q24H  . hydrochlorothiazide  12.5 mg Oral Daily  . lisinopril  10 mg Oral Daily  . oxybutynin  5 mg Oral BID  . tamsulosin  0.4 mg Oral Daily   Continuous Infusions: . cefTRIAXone (ROCEPHIN)  IV 2  g (01/28/19 0741)  . metronidazole 500 mg (01/28/19 0740)     LOS: 2 days   Time spent: 29 minutes   Darliss Cheney, MD [facility]  01/28/2019, 1:23 PM   To contact the attending provider between 7A-7P or the covering provider during after hours 7P-7A, please log into the web site [email or web address] and use password [email or web address].

## 2019-01-28 NOTE — Evaluation (Addendum)
Physical Therapy Evaluation Patient Details Name: Katie Carrillo MRN: [id number] DOB: 1940-04-23 Today's Date: 01/28/2019   History of Present Illness  Katie Carrillo is a 78 y.o. female with medical history significant of dementia, HTN, parkinsons dz, prior diverticulitis.  Slipped and hit head while going down 3 stairsOther than head lac, no intracranial injury, CT head and c spine are clear.Acute diverticulitis: Continues to have abdominal pain, slightly improved and tenderness improved as well.  Tolerating full liquid diet.  Will advance to soft diet and continue current antibiotics.   Clinical Impression   Pt admitted with above diagnosis. Independent, and working as a caregiver prior to admission;  Pt currently with functional limitations due to the deficits listed below (see PT Problem List). Pt will benefit from skilled PT to increase their independence and safety with mobility to allow discharge to the venue listed below.    Notable that her O2 sats ranged low: 89-93% on room air, and that she had a cough at initially getting up to sitting eOB.    Follow Up Recommendations Home health PT;Supervision/Assistance - 24 hour    Equipment Recommendations  Rolling walker with 5" wheels;3in1 (PT)    Recommendations for Other Services       Precautions / Restrictions Precautions Precautions: Fall Precaution Comments: fall risk reduced with use of RW      Mobility  Bed Mobility Overal bed mobility: Needs Assistance Bed Mobility: Supine to Sit     Supine to sit: Min assist     General bed mobility comments: Min handheld assist to pull to sit  Transfers Overall transfer level: Needs assistance Equipment used: Rolling walker (2 wheeled) Transfers: Sit to/from Stand Sit to Stand: Min guard         General transfer comment: Cues to scoot to EOB to make task easier by moving closer to her base of support; Stood without physical assist; slow  rise  Ambulation/Gait Ambulation/Gait assistance: Counsellor (Feet): 10 Feet Assistive device: Rolling walker (2 wheeled) Gait Pattern/deviations: Step-through pattern     General Gait Details: Using RW well  Stairs            Wheelchair Mobility    Modified Rankin (Stroke Patients Only)       Balance Overall balance assessment: Needs assistance           Standing balance-Leahy Scale: Fair Standing balance comment: on today's assessement, she appropriately requested a RW for initial amb/dynamic balance task                             Pertinent Vitals/Pain Pain Assessment: Faces Faces Pain Scale: Hurts a little bit Pain Location: abdominal soreness Pain Descriptors / Indicators: Sore Pain Intervention(s): Monitored during session    Home Living Family/patient expects to be discharged to:: Private residence Living Arrangements: Spouse/significant other Available Help at Discharge: Family Type of Home: House Home Access: Stairs to enter Entrance Stairs-Rails: Right Entrance Stairs-Number of Steps: 3 Home Layout: One level Home Equipment: None Additional Comments: Bathroom information gleaned form chart review; will nee to be verified    Prior Function Level of Independence: Independent         Comments: Works as a Building control surveyor for a client with [employer]; tells me her husband is interested in her stopping that job -- it is becoming more and more physical     Hand Dominance        Extremity/Trunk Assessment  Upper Extremity Assessment Upper Extremity Assessment: Overall WFL for tasks assessed(for simple tasks)    Lower Extremity Assessment Lower Extremity Assessment: Generalized weakness       Communication   Communication: No difficulties  Cognition Arousal/Alertness: Awake/alert Behavior During Therapy: WFL for tasks assessed/performed Overall Cognitive Status: Within Functional Limits for tasks assessed                                         General Comments General comments (skin integrity, edema, etc.): Noted cough that started with intially getting OOB; O2 sats on Room Air ranged 89-93%    Exercises     Assessment/Plan    PT Assessment Patient needs continued PT services  PT Problem List Decreased strength;Decreased activity tolerance;Decreased balance       PT Treatment Interventions DME instruction;Gait training;Stair training;Functional mobility training;Therapeutic activities;Therapeutic exercise;Balance training;Patient/family education    PT Goals (Current goals can be found in the Care Plan section)  Acute Rehab PT Goals Patient Stated Goal: Hopes to be home soon PT Goal Formulation: With patient Time For Goal Achievement: 02/11/19 Potential to Achieve Goals: Good    Frequency Min 3X/week   Barriers to discharge        Co-evaluation               AM-PAC PT "6 Clicks" Mobility  Outcome Measure Help needed turning from your back to your side while in a flat bed without using bedrails?: None Help needed moving from lying on your back to sitting on the side of a flat bed without using bedrails?: A Little Help needed moving to and from a bed to a chair (including a wheelchair)?: A Little Help needed standing up from a chair using your Carrillo (e.g., wheelchair or bedside chair)?: A Little Help needed to walk in hospital room?: A Little Help needed climbing 3-5 steps with a railing? : A Little 6 Click Score: 19    End of Session Equipment Utilized During Treatment: Gait belt Activity Tolerance: Patient tolerated treatment well Patient left: in chair;with call bell/phone within reach;with chair alarm set;Other (comment)(preparing to eat breakfast) Nurse Communication: Mobility status PT Visit Diagnosis: Unsteadiness on feet (R26.81);Muscle weakness (generalized) (M62.81)    Time: [id number] PT Time Calculation (min) (ACUTE ONLY): 18  min   Charges:   PT Evaluation $PT Eval Low Complexity: [place], PT  Acute Rehabilitation Services Pager [phone] Office [phone]   Colletta [place] 01/28/2019, 8:45 AM

## 2019-01-29 LAB — BASIC METABOLIC PANEL
Anion gap: 9 (ref 5–15)
BUN: 14 mg/dL (ref 8–23)
CO2: 25 mmol/L (ref 22–32)
Calcium: 8.6 mg/dL — ABNORMAL LOW (ref 8.9–10.3)
Chloride: 103 mmol/L (ref 98–111)
Creatinine, Ser: 0.79 mg/dL (ref 0.44–1.00)
GFR calc Af Amer: >60 mL/min (ref >60)
GFR calc non Af Amer: >60 mL/min (ref >60)
Glucose, Bld: 120 mg/dL — ABNORMAL HIGH (ref 70–99)
Potassium: 3.8 mmol/L (ref 3.5–5.1)
Sodium: 137 mmol/L (ref 135–145)

## 2019-01-29 LAB — CBC WITH DIFFERENTIAL/PLATELET
Abs Immature Granulocytes: 0.04 10*3/uL (ref 0.00–0.07)
Basophils Absolute: 0.1 10*3/uL (ref 0.0–0.1)
Basophils Relative: 1 %
Eosinophils Absolute: 0.2 10*3/uL (ref 0.0–0.5)
Eosinophils Relative: 2 %
HCT: 35.3 % — ABNORMAL LOW (ref 36.0–46.0)
Hemoglobin: 11.1 g/dL — ABNORMAL LOW (ref 12.0–15.0)
Immature Granulocytes: 0 %
Lymphocytes Relative: 16 %
Lymphs Abs: 1.6 10*3/uL (ref 0.7–4.0)
MCH: 28.8 pg (ref 26.0–34.0)
MCHC: 31.4 g/dL (ref 30.0–36.0)
MCV: 91.5 fL (ref 80.0–100.0)
Monocytes Absolute: 0.8 10*3/uL (ref 0.1–1.0)
Monocytes Relative: 8 %
Neutro Abs: 7 10*3/uL (ref 1.7–7.7)
Neutrophils Relative %: 73 %
Platelets: 229 10*3/uL (ref 150–400)
RBC: 3.86 MIL/uL — ABNORMAL LOW (ref 3.87–5.11)
RDW: 14.9 % (ref 11.5–15.5)
WBC: 9.6 10*3/uL (ref 4.0–10.5)
nRBC: 0 % (ref 0.0–0.2)

## 2019-01-29 MED ORDER — OXYBUTYNIN CHLORIDE 5 MG PO TABS: 5.0000 mg | ORAL_TABLET | Freq: Two times a day (BID) | ORAL | 0 refills | Status: DC

## 2019-01-29 MED ORDER — METRONIDAZOLE 500 MG PO TABS: 500.0000 mg | ORAL_TABLET | Freq: Three times a day (TID) | ORAL | 0 refills | Status: AC

## 2019-01-29 MED ORDER — CIPROFLOXACIN HCL 500 MG PO TABS: 500.0000 mg | ORAL_TABLET | Freq: Two times a day (BID) | ORAL | 0 refills | Status: AC

## 2019-01-29 NOTE — Progress Notes (Signed)
Physical Therapy Treatment Patient Details Name: Katie Carrillo MRN: [id number] DOB: [birth year] Today's Date: 01/29/2019    History of Present Illness Katie Carrillo is a 78 y.o. female with medical history significant of dementia, HTN, parkinsons dz, prior diverticulitis.  Slipped and hit head while going down 3 stairsOther than head lac, no intracranial injury, CT head and c spine are clear.Acute diverticulitis: Continues to have abdominal pain, slightly improved and tenderness improved as well.  Tolerating full liquid diet.  Will advance to soft diet and continue current antibiotics.     PT Comments    Pt doing well with use of RW.  Con't to recommend HHPT with family support.   Follow Up Recommendations  Home health PT;Supervision/Assistance - 24 hour     Equipment Recommendations  Rolling walker with 5" wheels;3in1 (PT)    Recommendations for Other Services       Precautions / Restrictions Precautions Precautions: Fall    Mobility  Bed Mobility Overal bed mobility: Needs Assistance Bed Mobility: Supine to Sit     Supine to sit: Min guard     General bed mobility comments: min/guard, but pt using rail  Transfers Overall transfer level: Needs assistance Equipment used: Rolling walker (2 wheeled) Transfers: Sit to/from Stand Sit to Stand: Min guard         General transfer comment: good use of UE to/from bed, toilet, and recliner.  Ambulation/Gait Ambulation/Gait assistance: Min guard Gait Distance (Feet): 100 Feet(plus 10' to bathroom) Assistive device: Rolling walker (2 wheeled) Gait Pattern/deviations: Step-through pattern     General Gait Details: good cadence   Stairs             Wheelchair Mobility    Modified Rankin (Stroke Patients Only)       Balance Overall balance assessment: Needs assistance         Standing balance support: During functional activity Standing balance-Leahy Scale: Fair Standing balance comment: Able to  stand at sink and work on hair with fair balance, needs RW for safe gait                            Cognition Arousal/Alertness: Awake/alert Behavior During Therapy: WFL for tasks assessed/performed Overall Cognitive Status: Within Functional Limits for tasks assessed                                        Exercises      General Comments        Pertinent Vitals/Pain Pain Assessment: No/denies pain    Home Living                      Prior Function            PT Goals (current goals can now be found in the care plan section) Acute Rehab PT Goals PT Goal Formulation: With patient Time For Goal Achievement: 02/11/19 Potential to Achieve Goals: Good Progress towards PT goals: Progressing toward goals    Frequency    Min 3X/week      PT Plan Current plan remains appropriate    Co-evaluation              AM-PAC PT "6 Clicks" Mobility   Outcome Measure  Help needed turning from your back to your side while in a flat bed without using bedrails?: None  Help needed moving from lying on your back to sitting on the side of a flat bed without using bedrails?: A Little Help needed moving to and from a bed to a chair (including a wheelchair)?: A Little Help needed standing up from a chair using your arms (e.g., wheelchair or bedside chair)?: A Little Help needed to walk in hospital room?: A Little Help needed climbing 3-5 steps with a railing? : A Little 6 Click Score: 19    End of Session Equipment Utilized During Treatment: Gait belt Activity Tolerance: Patient tolerated treatment well Patient left: in chair;with call bell/phone within reach;with chair alarm set   PT Visit Diagnosis: Unsteadiness on feet (R26.81);Muscle weakness (generalized) (M62.81)     Time: 1035-1101 PT Time Calculation (min) (ACUTE ONLY): 26 min  Charges:  $Gait Training: 8-22 mins(no charge for time on toilet)                     [name],  [place] Pager [phone] 01/29/2019    [name] 01/29/2019, 1:04 PM

## 2019-01-29 NOTE — TOC Initial Note (Addendum)
Transition of Care [facility]) - Initial/Assessment Note    Patient Details  Name: Katie Carrillo MRN: [id number] Date of Birth: 02-02-1940  [facility] of [facility]) CM/SW Contact:    Marilu Favre, RN Phone Number: 01/29/2019, 9:29 AM  Clinical Narrative:                 Confirmed face sheet information with patient. From home with husband. Patient already has walker and 3 in1 at home. Provided Medicare.gov home health list. Patient's first choice ia [facility], if they cannot accept referral patient has no preference on home health agency.   Referral called to [facility] with [facility], she will call branch and than call NCM back with determination.   Mateo Flow with [facility] declined referral.   Left message for [facility] with [place]. Tommi Rumps can accept hhpt/ot order  Cassie with [facility], and Hoyle Sauer with [facility] declined referral.     Left message for [place] with [place] with [facility] checking to see if he can accept referral. Dian Situ has declined referral.  Mack Guise with [facility] , she is checking to see if she can accept referral . [facility] no longer covers [place].  Expected Discharge Plan: [place] Barriers to Discharge: Continued Medical Work up   Patient Goals and CMS Choice Patient states their goals for this hospitalization and ongoing recovery are:: to return to home CMS Medicare.gov Compare Post Acute Care list provided to:: Patient Choice offered to / list presented to : Patient  Expected Discharge Plan and Services Expected Discharge Plan: [place]   Discharge Planning Services: CM Consult Post Acute Care Choice: [place] arrangements for the past 2 months: Single Family Home Expected Discharge Date: 01/29/19               DME Arranged: N/A         HH Arranged: PT [place]: [place] ([place]) Date [place]: 01/29/19 Time [place]: [phone] Representative spoke with at [place]: [place] Arrangements/Services Living arrangements for the past 2 months: [place] with:: Spouse Patient language and need for interpreter reviewed:: Yes        Need for Family Participation in Patient Care: Yes (Comment) Care giver support system in place?: Yes (comment) Current home services: DME Criminal Activity/Legal Involvement Pertinent to Current Situation/Hospitalization: No - Comment as needed  Activities of Daily Living Home Assistive Devices/Equipment: None ADL Screening (condition at time of admission) Patient's cognitive ability adequate to safely complete daily activities?: Yes Is the patient deaf or have difficulty hearing?: No Does the patient have difficulty seeing, even when wearing glasses/contacts?: No Does the patient have difficulty concentrating, remembering, or making decisions?: No Patient able to express need for assistance with ADLs?: Yes Does the patient have difficulty dressing or bathing?: No Independently performs ADLs?: Yes (appropriate for developmental age) Does the patient have difficulty walking or climbing stairs?: No Weakness of Legs: None Weakness of Arms/Hands: None  Permission Sought/Granted   Permission granted to share information with : Yes, Verbal Permission Granted     Permission granted to share info w AGENCY: [place]        Emotional Assessment Appearance:: Appears stated age Attitude/Demeanor/Rapport: Engaged Affect (typically observed): Accepting Orientation: : Oriented to Self, Oriented to Place, Oriented to  Time, Oriented to Situation Alcohol / Substance Use: Not Applicable Psych Involvement: No (  comment)  Admission diagnosis:  Diverticulitis [K57.92] Injury of head, initial encounter [S09.90XA] Fall, initial encounter [W19.XXXA] Laceration of scalp, initial encounter [S01.01XA] Urinary tract infection without hematuria,  site unspecified [N39.0] Acute diverticulitis [K57.92] Patient Active Problem List   Diagnosis Date Noted  . Diverticulitis 01/26/2019  . Dementia ([place]) 01/26/2019  . Parkinson's disease ([place]) 01/26/2019  . Acute diverticulitis 01/26/2019  . Dense breast tissue 03/02/2014  . DCIS (ductal carcinoma in situ) of breast 09/30/2011  . Osteoarthritis of right knee 07/02/2011   PCP:  Lawerance Cruel, MD Pharmacy:   [facility], [place] - [address] [place] [address] Phone: [phone] Fax: [phone]  [id number] - [place], [address] [place] [place] [address] Phone: [phone] Fax: [phone]     Social Determinants of Health (SDOH) Interventions    Readmission Risk Interventions No flowsheet data found.

## 2019-01-29 NOTE — Discharge Summary (Signed)
Physician Discharge Summary  Katie Carrillo [id number] DOB: [birth year] DOA: 01/25/2019  PCP: Lawerance Cruel, MD  Admit date: 01/25/2019 Discharge date: 01/29/2019  Admitted From: Home Disposition: Home  Recommendations for Outpatient Follow-up:  1. Follow up with PCP in 1-2 weeks 2. Please obtain BMP/CBC in one week 3. Please follow up on the following pending results:  Home Health: Yes Equipment/Devices: None  Discharge Condition: Stable CODE STATUS: Full code Diet recommendation: Cardiac  Subjective: Seen and examined.  Feels much better with no more pain.  Tolerating regular diet.  Wants to go home.  Brief/Interim Summary: Katie Carrillo a 78 y.o.femalewith medical history significant ofdementia, HTN, parkinsons dz, prior diverticulitis presented to ED with complaint of abdominal pain for 1.5 weeks and also slipped and hit her head while going down 3 stairs.  In ED, she was found to have head laceration with no other intracranial injury.  CT head and CT spine were unremarkable.  White blood cells 14,000.  CT abdomen and pelvis confirmed diverticulitis with extensive phlegmon but no abscess or any perforation.  She was admitted to hospital service and was started on broad-spectrum IV antibiotics and kept n.p.o.  Subsequently when her pain improved, she was started on clears and diet was advanced over the course of last 2 to 3 days and she has tolerated regular diet with no more abdominal pain and very minimal left lower quadrant tenderness.  She was seen by PT OT and they recommended home health which has been arranged for her.  Now that patient is stable and tolerating regular diet, she is being discharged in stable condition.  I have prescribed her 7 more days of oral ciprofloxacin and Flagyl.  Discharge Diagnoses:  Principal Problem:   Diverticulitis Active Problems:   Dementia ([place])   Parkinson's disease ([place])   Acute diverticulitis    Discharge  Instructions  Discharge Instructions    Discharge patient   Complete by: As directed    Discharge disposition: 06-Home-Health Care Svc   Discharge patient date: 01/29/2019     Allergies as of 01/29/2019      Reactions   Morphine And Related Other (See Comments)   Hallucinations       Medication List    TAKE these medications   acetaminophen 325 MG tablet Commonly known as: TYLENOL Take 325-650 mg by mouth every 8 (eight) hours as needed for mild pain or headache.   carbidopa-levodopa 25-100 MG tablet Commonly known as: SINEMET IR Take 1 tablet by mouth 3 (three) times daily.   ciprofloxacin 500 MG tablet Commonly known as: Cipro Take 1 tablet (500 mg total) by mouth 2 (two) times daily for 7 days.   donepezil 23 MG Tabs tablet Commonly known as: ARICEPT Take 23 mg by mouth at bedtime.   Fish Oil 1200 MG Caps Take 1,200 mg by mouth every evening.   lidocaine 5 % Commonly known as: Lidoderm Place 1 patch onto the skin daily. Apply to painful area for 12 hours each day or night as needed.   lisinopril-hydrochlorothiazide 10-12.5 MG tablet Commonly known as: ZESTORETIC Take 0.5-1 tablets by mouth daily.   meloxicam 7.5 MG tablet Commonly known as: Mobic Take 1 tablet (7.5 mg total) by mouth daily.   memantine 10 MG tablet Commonly known as: NAMENDA Take 10 mg by mouth 2 (two) times daily.   metroNIDAZOLE 500 MG tablet Commonly known as: Flagyl Take 1 tablet (500 mg total) by mouth 3 (three) times daily for 7 days.  multivitamin with minerals Tabs tablet Take 1 tablet by mouth daily.   oxybutynin 5 MG tablet Commonly known as: DITROPAN Take 1 tablet (5 mg total) by mouth 2 (two) times daily. What changed: when to take this   oxyCODONE-acetaminophen 5-325 MG tablet Commonly known as: PERCOCET/ROXICET Take 1 tablet by mouth every 6 (six) hours as needed for severe pain.   propranolol 10 MG tablet Commonly known as: INDERAL Take 10 mg by mouth 2 (two)  times daily.   Red Yeast Rice 600 MG Caps Take 1,200 mg by mouth every evening.   traMADol 50 MG tablet Commonly known as: ULTRAM Take 1 tablet (50 mg total) by mouth every 12 (twelve) hours as needed for moderate pain.      Follow-up Information    Schedule an appointment as soon as possible for a visit  with Lawerance Cruel, MD.   Specialty: [facility] Contact information: [place] [address] [phone]          Allergies  Allergen Reactions  . Morphine And Related Other (See Comments)    Hallucinations     Consultations: None   Procedures/Studies: DG Chest 2 View  Result Date: 01/25/2019 CLINICAL DATA:  Syncope EXAM: CHEST - 2 VIEW COMPARISON:  March 14, 2017 FINDINGS: The heart size and mediastinal contours are within normal limits. Both lungs are clear. The visualized skeletal structures are unremarkable. IMPRESSION: No active cardiopulmonary disease. Electronically Signed   By: Prudencio Pair M.D.   On: 01/25/2019 22:13   CT Head Wo Contrast  Result Date: 01/25/2019 CLINICAL DATA:  Fall down 3 steps EXAM: CT HEAD WITHOUT CONTRAST TECHNIQUE: Contiguous axial images were obtained from the base of the skull through the vertex without intravenous contrast. COMPARISON:  None. FINDINGS: Brain: No evidence of acute territorial infarction, hemorrhage, hydrocephalus,extra-axial collection or mass lesion/mass effect. There is dilatation the ventricles and sulci consistent with age-related atrophy. Low-attenuation changes in the deep white matter consistent with small vessel ischemia. Vascular: No hyperdense vessel or unexpected calcification. Skull: The skull is intact. No fracture or focal lesion identified. Sinuses/Orbits: The visualized paranasal sinuses and mastoid air cells are clear. The orbits and globes intact. Other: Soft tissue hematoma seen overlying the right parietal skull. Cervical spine: Alignment: Straightening of the normal  cervical lordosis. Skull base and vertebrae: Visualized skull base is intact. No atlanto-occipital dissociation. The vertebral body heights are well maintained. No fracture or pathologic osseous lesion seen. Large anterior osteophytes seen in the mid to lower cervical spine. Soft tissues and spinal canal: The visualized paraspinal soft tissues are unremarkable. No prevertebral soft tissue swelling is seen. The spinal canal is grossly unremarkable, no large epidural collection or significant canal narrowing. Disc levels: Cervical spine spondylosis is seen most notable at C5-C6 and C6-C7. Upper chest: The lung apices are clear. Thoracic inlet is within normal limits. Heterogeneous right thyroid lobe with a low-density lesion measuring less than 1 cm. Other: None IMPRESSION: No acute intracranial abnormality. Findings consistent with age related atrophy and chronic small vessel ischemia Soft tissue hematoma overlying the right parietal skull No acute fracture or malalignment of the spine. Electronically Signed   By: Prudencio Pair M.D.   On: 01/25/2019 23:55   CT Cervical Spine Wo Contrast  Result Date: 01/25/2019 CLINICAL DATA:  Fall down 3 steps EXAM: CT HEAD WITHOUT CONTRAST TECHNIQUE: Contiguous axial images were obtained from the base of the skull through the vertex without intravenous contrast. COMPARISON:  None. FINDINGS: Brain:  No evidence of acute territorial infarction, hemorrhage, hydrocephalus,extra-axial collection or mass lesion/mass effect. There is dilatation the ventricles and sulci consistent with age-related atrophy. Low-attenuation changes in the deep white matter consistent with small vessel ischemia. Vascular: No hyperdense vessel or unexpected calcification. Skull: The skull is intact. No fracture or focal lesion identified. Sinuses/Orbits: The visualized paranasal sinuses and mastoid air cells are clear. The orbits and globes intact. Other: Soft tissue hematoma seen overlying the right  parietal skull. Cervical spine: Alignment: Straightening of the normal cervical lordosis. Skull base and vertebrae: Visualized skull base is intact. No atlanto-occipital dissociation. The vertebral body heights are well maintained. No fracture or pathologic osseous lesion seen. Large anterior osteophytes seen in the mid to lower cervical spine. Soft tissues and spinal canal: The visualized paraspinal soft tissues are unremarkable. No prevertebral soft tissue swelling is seen. The spinal canal is grossly unremarkable, no large epidural collection or significant canal narrowing. Disc levels: Cervical spine spondylosis is seen most notable at C5-C6 and C6-C7. Upper chest: The lung apices are clear. Thoracic inlet is within normal limits. Heterogeneous right thyroid lobe with a low-density lesion measuring less than 1 cm. Other: None IMPRESSION: No acute intracranial abnormality. Findings consistent with age related atrophy and chronic small vessel ischemia Soft tissue hematoma overlying the right parietal skull No acute fracture or malalignment of the spine. Electronically Signed   By: Prudencio Pair M.D.   On: 01/25/2019 23:55   CT Abdomen Pelvis W Contrast  Result Date: 01/26/2019 CLINICAL DATA:  Diverticulitis suspected, fell down 3 steps EXAM: CT ABDOMEN AND PELVIS WITH CONTRAST TECHNIQUE: Multidetector CT imaging of the abdomen and pelvis was performed using the standard protocol following bolus administration of intravenous contrast. CONTRAST:  142mL OMNIPAQUE IOHEXOL 300 MG/ML  SOLN COMPARISON:  CT 05/18/2009 FINDINGS: Lower chest: Atelectatic changes seen dependently in the lung bases. Bandlike areas of opacity likely reflect subsegmental atelectasis and/or scarring. Cardiac size at the upper limits of normal. No pericardial effusion. Hepatobiliary: Subcentimeter hypoattenuating focus in segment 4 of the liver (3/16) too small to fully characterize on CT imaging but statistically likely benign. No worrisome  focal liver abnormality is seen. No gallstones, gallbladder wall thickening, or biliary dilatation. Pancreas: Unremarkable. No pancreatic ductal dilatation or surrounding inflammatory changes. Spleen: Normal in size without focal abnormality. No splenic injury or perisplenic hematoma. Adrenals/Urinary Tract: Partially exophytic 3.9 cm fluid attenuation cyst in the interpolar left kidney, increased in size from comparison CT but without worrisome features. Additional fluid attenuation cyst in the anterior interpolar right kidney measuring 1.2 cm in size few additional subcentimeter hypoattenuating foci too small to fully characterize on CT imaging but statistically likely benign. No worrisome renal lesions. No urolithiasis or hydronephrosis. Kidneys enhance and excrete symmetrically. There is circumferential bladder wall thickening with intraluminal gas. No loss of discernible fat plane with the adjacent inflamed bowel to suggest direct fistulization. Stomach/Bowel: Distal esophagus, stomach and duodenal sweep are unremarkable. No small bowel wall thickening or dilatation. Postsurgical changes of the right hemicolon likely reflecting prior hemicolectomy and ileocecal anastomosis. There is extensive distal colonic diverticulosis with focal thickening and phlegmon centered upon the distal sigmoid and multiple inflamed colonic diverticula with adjacent likely reactive free fluid. No extraluminal gas is seen. No organized collection or abscess. Vascular/Lymphatic: Reactive adenopathy in the pelvis. Atherosclerotic plaque within the normal caliber aorta. Major venous structures are unremarkable with some laminar filling artifact from the left iliac vein. Reproductive: Uterus is surgically absent. No concerning adnexal lesions. Other: Small  volume free fluid in the pelvis with extensive pericolonic phlegmon and perivesicular stranding as well. Features are better detailed above. No extraluminal gas. No organized collection  or abscess. Mild posterior body wall edema. No bowel containing hernias. Musculoskeletal: Multilevel degenerative changes are present in the imaged portions of the spine. No acute osseous abnormality or suspicious osseous lesion. Additional degenerative changes noted in the hips. Remote left posterior tenth rib fracture. IMPRESSION: 1. Findings consistent with acute sigmoid diverticulitis without convincing evidence for perforation or abscess formation. Extensive phlegmon seen within the low pelvis. 2. Circumferential bladder wall thickening with intraluminal gas, findings are suspicious for a concurrent cystitis rather than simple reactive change. Correlate with urinalysis. No CT evidence for direct fistulization with the adjacent bowel. 3. No evidence of acute traumatic abnormality in the abdomen or pelvis. 4. Remote left tenth rib fracture. 5. Postsurgical changes of the right colon, correlate with surgical history. Prior hysterectomy. 6.  Aortic Atherosclerosis (ICD10-I70.0). Electronically Signed   By: Lovena Le M.D.   On: 01/26/2019 00:07      Discharge Exam: Vitals:   01/28/19 2128 01/29/19 0459  BP: 140/67 133/69  Pulse: 77 63  Resp: 20 18  Temp: 99.4 F (37.4 C) 98.6 F (37 C)  SpO2: 95% 91%   Vitals:   01/28/19 0425 01/28/19 1447 01/28/19 2128 01/29/19 0459  BP: 124/60 (!) 119/51 140/67 133/69  Pulse: 62 (!) 59 77 63  Resp: 16 18 20 18   Temp: 98.2 F (36.8 C) 98.6 F (37 C) 99.4 F (37.4 C) 98.6 F (37 C)  TempSrc: Oral Oral Oral Oral  SpO2: 91% 95% 95% 91%  Weight:      Height:        General: Pt is alert, awake, not in acute distress Cardiovascular: RRR, S1/S2 +, no rubs, no gallops Respiratory: CTA bilaterally, no wheezing, no rhonchi Abdominal: Soft, very minimal left lower quadrant tenderness, ND, bowel sounds + Extremities: no edema, no cyanosis    The results of significant diagnostics from this hospitalization (including imaging, microbiology, ancillary  and laboratory) are listed below for reference.     Microbiology: Recent Results (from the past 240 hour(s))  SARS CORONAVIRUS 2 (TAT 6-24 HRS) Nasopharyngeal Nasopharyngeal Swab     Status: None   Collection Time: 01/26/19  1:59 AM   Specimen: Nasopharyngeal Swab  Result Value Ref Range Status   SARS Coronavirus 2 NEGATIVE NEGATIVE Final    Comment: (NOTE) SARS-CoV-2 target nucleic acids are NOT DETECTED. The SARS-CoV-2 RNA is generally detectable in upper and lower respiratory specimens during the acute phase of infection. Negative results do not preclude SARS-CoV-2 infection, do not rule out co-infections with other pathogens, and should not be used as the sole basis for treatment or other patient management decisions. Negative results must be combined with clinical observations, patient history, and epidemiological information. The expected result is Negative. Fact Sheet for Patients: SugarRoll.be Fact Sheet for Healthcare Providers: https://www.woods-mathews.com/ This test is not yet approved or cleared by the [place] FDA and  has been authorized for detection and/or diagnosis of SARS-CoV-2 by FDA under an Emergency Use Authorization (EUA). This EUA will remain  in effect (meaning this test can be used) for the duration of the COVID-19 declaration under Section 56 4(b)(1) of the Act, 21 U.S.C. section 360bbb-3(b)(1), unless the authorization is terminated or revoked sooner. Performed at [facility], [place] [address] [place], [place] [address]   Urine culture     Status: Abnormal  Collection Time: 01/26/19  6:00 AM   Specimen: Urine, Clean Catch  Result Value Ref Range Status   Specimen Description URINE, CLEAN CATCH  Final   Special Requests NONE  Final   Culture (A)  Final    <10,000 COLONIES/mL INSIGNIFICANT GROWTH Performed at [facility], [address] [place], [place] [address]    Report Status  01/27/2019 FINAL  Final  C difficile quick scan w PCR reflex     Status: None   Collection Time: 01/26/19  3:57 PM   Specimen: STOOL  Result Value Ref Range Status   C Diff antigen NEGATIVE NEGATIVE Final   C Diff toxin NEGATIVE NEGATIVE Final   C Diff interpretation No C. difficile detected.  Final    Comment: Performed at [facility], [place] [address], [place], [place] [address]     Labs: BNP (last 3 results) No results for input(s): BNP in the last 8760 hours. Basic Metabolic Panel: Recent Labs  Lab 01/25/19 2231 01/26/19 0319 01/27/19 0837 01/28/19 0314 01/29/19 0222  NA 139 140 139 140 137  K 3.7 3.5 3.4* 3.4* 3.8  CL 103 102 103 104 103  CO2 26 29 25 25 25   GLUCOSE 141* 119* 157* 112* 120*  BUN 16 14 12 12 14   CREATININE 0.91 0.74 0.79 0.77 0.79  CALCIUM 9.1 8.9 8.5* 8.6* 8.6*   Liver Function Tests: Recent Labs  Lab 01/25/19 2231  AST 19  ALT 6  ALKPHOS 59  BILITOT 0.9  PROT 6.6  ALBUMIN 3.7   Recent Labs  Lab 01/25/19 2231  LIPASE 24   No results for input(s): AMMONIA in the last 168 hours. CBC: Recent Labs  Lab 01/25/19 2231 01/26/19 0319 01/27/19 0837 01/28/19 0314 01/29/19 0222  WBC 14.1* 12.1* 10.4 10.9* 9.6  NEUTROABS 11.7*  --  8.4* 8.2* 7.0  HGB 12.9 12.0 11.3* 11.7* 11.1*  HCT 41.8 37.3 34.7* 35.4* 35.3*  MCV 94.1 90.5 91.1 91.9 91.5  PLT 244 208 219 213 229   Cardiac Enzymes: No results for input(s): CKTOTAL, CKMB, CKMBINDEX, TROPONINI in the last 168 hours. BNP: Invalid input(s): POCBNP CBG: No results for input(s): GLUCAP in the last 168 hours. D-Dimer No results for input(s): DDIMER in the last 72 hours. Hgb A1c No results for input(s): HGBA1C in the last 72 hours. Lipid Profile No results for input(s): CHOL, HDL, LDLCALC, TRIG, CHOLHDL, LDLDIRECT in the last 72 hours. Thyroid function studies No results for input(s): TSH, T4TOTAL, T3FREE, THYROIDAB in the last 72 hours.  Invalid input(s): FREET3 Anemia work  up No results for input(s): VITAMINB12, FOLATE, FERRITIN, TIBC, IRON, RETICCTPCT in the last 72 hours. Urinalysis    Component Value Date/Time   COLORURINE YELLOW 01/26/2019 0210   APPEARANCEUR CLEAR 01/26/2019 0210   LABSPEC >1.046 (H) 01/26/2019 0210   PHURINE 6.0 01/26/2019 0210   GLUCOSEU NEGATIVE 01/26/2019 0210   HGBUR NEGATIVE 01/26/2019 0210   BILIRUBINUR NEGATIVE 01/26/2019 0210   KETONESUR NEGATIVE 01/26/2019 0210   PROTEINUR NEGATIVE 01/26/2019 0210   UROBILINOGEN 0.2 06/18/2011 1036   NITRITE POSITIVE (A) 01/26/2019 0210   LEUKOCYTESUR NEGATIVE 01/26/2019 0210   Sepsis Labs Invalid input(s): PROCALCITONIN,  WBC,  LACTICIDVEN Microbiology Recent Results (from the past 240 hour(s))  SARS CORONAVIRUS 2 (TAT 6-24 HRS) Nasopharyngeal Nasopharyngeal Swab     Status: None   Collection Time: 01/26/19  1:59 AM   Specimen: Nasopharyngeal Swab  Result Value Ref Range Status   SARS Coronavirus 2  NEGATIVE NEGATIVE Final    Comment: (NOTE) SARS-CoV-2 target nucleic acids are NOT DETECTED. The SARS-CoV-2 RNA is generally detectable in upper and lower respiratory specimens during the acute phase of infection. Negative results do not preclude SARS-CoV-2 infection, do not rule out co-infections with other pathogens, and should not be used as the sole basis for treatment or other patient management decisions. Negative results must be combined with clinical observations, patient history, and epidemiological information. The expected result is Negative. Fact Sheet for Patients: SugarRoll.be Fact Sheet for Healthcare Providers: https://www.woods-mathews.com/ This test is not yet approved or cleared by the [place] FDA and  has been authorized for detection and/or diagnosis of SARS-CoV-2 by FDA under an Emergency Use Authorization (EUA). This EUA will remain  in effect (meaning this test can be used) for the duration of the COVID-19  declaration under Section 56 4(b)(1) of the Act, 21 U.S.C. section 360bbb-3(b)(1), unless the authorization is terminated or revoked sooner. Performed at [facility], [place] [address] [place], [place] [address]   Urine culture     Status: Abnormal   Collection Time: 01/26/19  6:00 AM   Specimen: Urine, Clean Catch  Result Value Ref Range Status   Specimen Description URINE, CLEAN CATCH  Final   Special Requests NONE  Final   Culture (A)  Final    <10,000 COLONIES/mL INSIGNIFICANT GROWTH Performed at [facility], [place] [address], [place], [place] [address]    Report Status 01/27/2019 FINAL  Final  C difficile quick scan w PCR reflex     Status: None   Collection Time: 01/26/19  3:57 PM   Specimen: STOOL  Result Value Ref Range Status   C Diff antigen NEGATIVE NEGATIVE Final   C Diff toxin NEGATIVE NEGATIVE Final   C Diff interpretation No C. difficile detected.  Final    Comment: Performed at [facility], [place] [address], [place],  [address]     Time coordinating discharge: Over 30 minutes  SIGNED:   Darliss Cheney, MD  [facility] 01/29/2019, 8:36 AM  If 7PM-7AM, please contact night-coverage [email or web address] Password [email or web address]

## 2019-01-29 NOTE — Progress Notes (Signed)
PT Cancellation Note  Patient Details Name: Katie Carrillo MRN: [id number] DOB: 03-15-1940   Cancelled Treatment:    Reason Eval/Treat Not Completed: Other (comment)Pt eating breakfast and wanted to finish. Pt is scheduled to d/c today with HHPT.  Will check back as schedule permits.   [name] 01/29/2019, 9:28 AM

## 2019-02-23 ENCOUNTER — Other Ambulatory Visit: Payer: Self-pay

## 2019-02-23 ENCOUNTER — Ambulatory Visit
Admission: RE | Admit: 2019-02-23 | Discharge: 2019-02-23 | Disposition: A | Discharge status: Home / Self Care | Payer: Medicare Other | Source: Ambulatory Visit | Attending: Family Medicine | Admitting: Family Medicine

## 2019-02-23 DIAGNOSIS — Z1231 Encounter for screening mammogram for malignant neoplasm of breast: Secondary | ICD-10-CM

## 2019-04-06 ENCOUNTER — Encounter (HOSPITAL_BASED_OUTPATIENT_CLINIC_OR_DEPARTMENT_OTHER): Payer: Self-pay | Admitting: *Deleted

## 2019-04-06 ENCOUNTER — Emergency Department (HOSPITAL_BASED_OUTPATIENT_CLINIC_OR_DEPARTMENT_OTHER)
Admission: EM | Admit: 2019-04-06 | Discharge: 2019-04-06 | Disposition: A | Discharge status: Home / Self Care | Payer: Medicare Other | Attending: Emergency Medicine | Admitting: Emergency Medicine

## 2019-04-06 ENCOUNTER — Emergency Department (HOSPITAL_BASED_OUTPATIENT_CLINIC_OR_DEPARTMENT_OTHER): Payer: Medicare Other

## 2019-04-06 ENCOUNTER — Other Ambulatory Visit: Payer: Self-pay

## 2019-04-06 DIAGNOSIS — K5732 Diverticulitis of large intestine without perforation or abscess without bleeding: Secondary | ICD-10-CM | POA: Diagnosis not present

## 2019-04-06 DIAGNOSIS — K5792 Diverticulitis of intestine, part unspecified, without perforation or abscess without bleeding: Secondary | ICD-10-CM

## 2019-04-06 DIAGNOSIS — I10 Essential (primary) hypertension: Secondary | ICD-10-CM | POA: Diagnosis not present

## 2019-04-06 DIAGNOSIS — Z886 Allergy status to analgesic agent status: Secondary | ICD-10-CM | POA: Insufficient documentation

## 2019-04-06 DIAGNOSIS — R1032 Left lower quadrant pain: Secondary | ICD-10-CM | POA: Diagnosis present

## 2019-04-06 DIAGNOSIS — Z79899 Other long term (current) drug therapy: Secondary | ICD-10-CM | POA: Insufficient documentation

## 2019-04-06 DIAGNOSIS — G20 Parkinson's disease: Secondary | ICD-10-CM | POA: Insufficient documentation

## 2019-04-06 DIAGNOSIS — F0280 Dementia in other diseases classified elsewhere without behavioral disturbance: Secondary | ICD-10-CM | POA: Diagnosis not present

## 2019-04-06 LAB — CBC WITH DIFFERENTIAL/PLATELET
Abs Immature Granulocytes: 0.02 10*3/uL (ref 0.00–0.07)
Basophils Absolute: 0.1 10*3/uL (ref 0.0–0.1)
Basophils Relative: 1 %
Eosinophils Absolute: 0.2 10*3/uL (ref 0.0–0.5)
Eosinophils Relative: 2 %
HCT: 41.3 % (ref 36.0–46.0)
Hemoglobin: 13.1 g/dL (ref 12.0–15.0)
Immature Granulocytes: 0 %
Lymphocytes Relative: 16 %
Lymphs Abs: 1.7 10*3/uL (ref 0.7–4.0)
MCH: 29.8 pg (ref 26.0–34.0)
MCHC: 31.7 g/dL (ref 30.0–36.0)
MCV: 94.1 fL (ref 80.0–100.0)
Monocytes Absolute: 0.9 10*3/uL (ref 0.1–1.0)
Monocytes Relative: 8 %
Neutro Abs: 7.8 10*3/uL — ABNORMAL HIGH (ref 1.7–7.7)
Neutrophils Relative %: 73 %
Platelets: 233 10*3/uL (ref 150–400)
RBC: 4.39 MIL/uL (ref 3.87–5.11)
RDW: 15.4 % (ref 11.5–15.5)
WBC: 10.6 10*3/uL — ABNORMAL HIGH (ref 4.0–10.5)
nRBC: 0 % (ref 0.0–0.2)

## 2019-04-06 LAB — COMPREHENSIVE METABOLIC PANEL
ALT: 10 U/L (ref 0–44)
AST: 15 U/L (ref 15–41)
Albumin: 4.1 g/dL (ref 3.5–5.0)
Alkaline Phosphatase: 60 U/L (ref 38–126)
Anion gap: 10 (ref 5–15)
BUN: 19 mg/dL (ref 8–23)
CO2: 28 mmol/L (ref 22–32)
Calcium: 9.1 mg/dL (ref 8.9–10.3)
Chloride: 101 mmol/L (ref 98–111)
Creatinine, Ser: 0.89 mg/dL (ref 0.44–1.00)
GFR calc Af Amer: >60 mL/min (ref >60)
GFR calc non Af Amer: >60 mL/min (ref >60)
Glucose, Bld: 124 mg/dL — ABNORMAL HIGH (ref 70–99)
Potassium: 3.2 mmol/L — ABNORMAL LOW (ref 3.5–5.1)
Sodium: 139 mmol/L (ref 135–145)
Total Bilirubin: 0.4 mg/dL (ref 0.3–1.2)
Total Protein: 7.6 g/dL (ref 6.5–8.1)

## 2019-04-06 LAB — URINALYSIS, ROUTINE W REFLEX MICROSCOPIC
Bilirubin Urine: NEGATIVE
Glucose, UA: NEGATIVE mg/dL
Hgb urine dipstick: NEGATIVE
Ketones, ur: NEGATIVE mg/dL
Leukocytes,Ua: NEGATIVE
Nitrite: NEGATIVE
Protein, ur: NEGATIVE mg/dL
Specific Gravity, Urine: >1.03 — ABNORMAL HIGH (ref 1.005–1.030)
pH: 5.5 (ref 5.0–8.0)

## 2019-04-06 MED ORDER — HYDROCODONE-ACETAMINOPHEN 5-325 MG PO TABS: 1.0000 | ORAL_TABLET | Freq: Four times a day (QID) | ORAL | 0 refills | Status: DC | PRN

## 2019-04-06 MED ORDER — CIPROFLOXACIN HCL 500 MG PO TABS: 500.0000 mg | ORAL_TABLET | Freq: Two times a day (BID) | ORAL | 0 refills | Status: DC

## 2019-04-06 MED ORDER — METRONIDAZOLE 500 MG PO TABS: 500.0000 mg | ORAL_TABLET | Freq: Three times a day (TID) | ORAL | 0 refills | Status: DC

## 2019-04-06 MED ORDER — IOHEXOL 300 MG/ML  SOLN
100.0000 mL | Freq: Once | INTRAMUSCULAR | Status: AC | PRN
  Administered 2019-04-06: 100 mL via INTRAVENOUS

## 2019-04-06 NOTE — ED Provider Notes (Signed)
[facility] EMERGENCY DEPARTMENT Provider Note   CSN: [id number] Arrival date & time: 04/06/19  1700     History Chief Complaint  Patient presents with  . Abdominal Pain    Katie Carrillo is a 79 y.o. female.  Patient is a 79 year old female with past medical history of diverticulosis, dementia, Parkinson's, hypertension.  She presents today for evaluation of abdominal pain.  She describes pain in her left lower abdomen that has been occurring intermittently for several weeks, however worsened over the past few days.  She describes a crampy pain to the left lower quadrant.  She does report some constipation, but did have a bowel movement this morning.  This was nonbloody.  She denies fevers or chills.  The history is provided by the patient.  Abdominal Pain Pain location:  LLQ Pain quality: cramping   Pain radiates to:  Does not radiate Pain severity:  Moderate Onset quality:  Sudden Duration:  3 weeks Timing:  Intermittent Progression:  Worsening Chronicity:  New Relieved by:  Nothing Worsened by:  Movement and palpation      Past Medical History:  Diagnosis Date  . Cancer [facility])    breast      lumpectomy   lt  . Dementia   . Hypertension    dr Loney Hering    [employer]  . Personal history of radiation therapy 2008  . Urinary urgency     Patient Active Problem List   Diagnosis Date Noted  . Diverticulitis 01/26/2019  . Dementia ([place]) 01/26/2019  . Parkinson's disease ([place]) 01/26/2019  . Acute diverticulitis 01/26/2019  . Dense breast tissue 03/02/2014  . DCIS (ductal carcinoma in situ) of breast 09/30/2011  . Osteoarthritis of right knee 07/02/2011    Past Surgical History:  Procedure Laterality Date  . ABDOMINAL HYSTERECTOMY    . BREAST BIOPSY Left 01/2006  . BREAST EXCISIONAL BIOPSY Right   . BREAST LUMPECTOMY Left 02/2006  . BREAST SURGERY     lt  . hemmoriods    . TOTAL KNEE ARTHROPLASTY  06/30/2011   Procedure: TOTAL KNEE ARTHROPLASTY;   Surgeon: Kerin Salen, MD;  Location: [place];  Service: Orthopedics;  Laterality: Right;     OB History   No obstetric history on file.     No family history on file.  Social History   Tobacco Use  . Smoking status: Never Smoker  . Smokeless tobacco: Never Used  Substance Use Topics  . Alcohol use: No  . Drug use: No    Home Medications Prior to Admission medications   Medication Sig Start Date End Date Taking? Authorizing Provider  acetaminophen (TYLENOL) 325 MG tablet Take 325-650 mg by mouth every 8 (eight) hours as needed for mild pain or headache.     [provider]  carbidopa-levodopa (SINEMET IR) 25-100 MG tablet Take 1 tablet by mouth 3 (three) times daily.    [provider]  donepezil (ARICEPT) 23 MG TABS tablet Take 23 mg by mouth at bedtime.    [provider]  lidocaine (LIDODERM) 5 % Place 1 patch onto the skin daily. Apply to painful area for 12 hours each day or night as needed. Patient not taking: Reported on 01/27/2019 03/14/17   Tanna Furry, MD  lisinopril-hydrochlorothiazide (PRINZIDE,ZESTORETIC) 10-12.5 MG per tablet Take 0.5-1 tablets by mouth daily.  08/30/11   [provider]  meloxicam (MOBIC) 7.5 MG tablet Take 1 tablet (7.5 mg total) by mouth daily. Patient not taking: Reported on  01/27/2019 03/14/17   Tanna Furry, MD  memantine (NAMENDA) 10 MG tablet Take 10 mg by mouth 2 (two) times daily.    [provider]  Multiple Vitamin (MULITIVITAMIN WITH MINERALS) TABS Take 1 tablet by mouth daily.    [provider]  Omega-3 Fatty Acids (FISH OIL) 1200 MG CAPS Take 1,200 mg by mouth every evening.    [provider]  oxybutynin (DITROPAN) 5 MG tablet Take 1 tablet (5 mg total) by mouth 2 (two) times daily. 01/29/19 02/28/19  Darliss Cheney, MD  oxyCODONE-acetaminophen (PERCOCET/ROXICET) 5-325 MG tablet Take 1 tablet by mouth every 6 (six) hours as needed for severe pain. Patient not taking: Reported on  01/27/2019 06/16/16   Mackuen, Courteney Lyn, MD  propranolol (INDERAL) 10 MG tablet Take 10 mg by mouth 2 (two) times daily.    [provider]  Red Yeast Rice 600 MG CAPS Take 1,200 mg by mouth every evening.     [provider]  traMADol (ULTRAM) 50 MG tablet Take 1 tablet (50 mg total) by mouth every 12 (twelve) hours as needed for moderate pain. Patient not taking: Reported on 01/27/2019 03/14/17   Tanna Furry, MD    Allergies    Morphine and related  Review of Systems   Review of Systems  Gastrointestinal: Positive for abdominal pain.  All other systems reviewed and are negative.   Physical Exam Updated Vital Signs BP 139/79 (BP Location: Right Arm)   Pulse 68   Temp 98.7 F (37.1 C) (Oral)   Resp 18   Ht 5\' 8"  (1.727 m)   Wt 86.2 kg   SpO2 98%   BMI 28.89 kg/m   Physical Exam Vitals and nursing note reviewed.  Constitutional:      General: She is not in acute distress.    Appearance: She is well-developed. She is not diaphoretic.  HENT:     Head: Normocephalic and atraumatic.  Cardiovascular:     Rate and Rhythm: Normal rate and regular rhythm.     Heart sounds: No murmur. No friction rub. No gallop.   Pulmonary:     Effort: Pulmonary effort is normal. No respiratory distress.     Breath sounds: Normal breath sounds. No wheezing.  Abdominal:     General: Bowel sounds are normal. There is no distension.     Palpations: Abdomen is soft.     Tenderness: There is abdominal tenderness in the left lower quadrant. There is no right CVA tenderness, left CVA tenderness, guarding or rebound.  Musculoskeletal:        General: Normal range of motion.     Cervical back: Normal range of motion and neck supple.  Skin:    General: Skin is warm and dry.  Neurological:     Mental Status: She is alert and oriented to person, place, and time.     ED Results / Procedures / Treatments   Labs (all labs ordered are listed, but only abnormal results are  displayed) Labs Reviewed  COMPREHENSIVE METABOLIC PANEL  CBC WITH DIFFERENTIAL/PLATELET  URINALYSIS, ROUTINE W REFLEX MICROSCOPIC    EKG None  Radiology No results found.  Procedures Procedures (including critical care time)  Medications Ordered in ED Medications - No data to display  ED Course  I have reviewed the triage vital signs and the nursing notes.  Pertinent labs & imaging results that were available during my care of the patient were reviewed by me and considered in my medical decision making (see  chart for details).    MDM Rules/Calculators/A&P  CT scan shows acute, uncomplicated diverticulitis.  This will be treated with Cipro and Flagyl, pain medication, and follow-up as needed.  Final Clinical Impression(s) / ED Diagnoses Final diagnoses:  None    Rx / DC Orders ED Discharge Orders    None       Veryl Speak, MD 04/06/19 [phone]

## 2019-04-06 NOTE — ED Triage Notes (Signed)
Pain in L lower abd. That started yesterday.  Pt. Points to the L lower abd and L groin area.  Pt. Reports she has history of diverticulitis. Pt. Is also reporting she has constipation issues.

## 2019-04-06 NOTE — Discharge Instructions (Addendum)
Begin taking Cipro and Flagyl as prescribed.  Take hydrocodone as prescribed as needed for pain.  Return to the Emergency Department if your develop worsening pain, high fever, bloody stools, or other new and concerning symptoms.

## 2019-04-06 NOTE — ED Notes (Signed)
Pt unable to urinate at this time.  

## 2019-11-08 ENCOUNTER — Other Ambulatory Visit (HOSPITAL_COMMUNITY): Payer: Self-pay | Admitting: Family

## 2019-11-08 DIAGNOSIS — U071 COVID-19: Secondary | ICD-10-CM

## 2019-11-08 NOTE — Progress Notes (Signed)
I connected by phone with Katie Carrillo on 11/08/2019 at 6:34 PM to discuss the potential use of a new treatment for mild to moderate COVID-19 viral infection in non-hospitalized patients.  This patient is a 79 y.o. female that meets the FDA criteria for Emergency Use Authorization of COVID monoclonal antibody casirivimab/imdevimab or bamlanivimab/eteseviamb.  Has a (+) direct SARS-CoV-2 viral test result  Has mild or moderate COVID-19   Is NOT hospitalized due to COVID-19  Is within 10 days of symptom onset  Has at least one of the high risk factor(s) for progression to severe COVID-19 and/or hospitalization as defined in EUA.  Specific high risk criteria : Older age (>/= 78 yo) and Immunosuppressive Disease or Treatment   Symptom onset 11/01/19 of fatigue and cough.   I have spoken and communicated the following to the patient or parent/caregiver regarding COVID monoclonal antibody treatment:  1. FDA has authorized the emergency use for the treatment of mild to moderate COVID-19 in adults and pediatric patients with positive results of direct SARS-CoV-2 viral testing who are 28 years of age and older weighing at least 40 kg, and who are at high risk for progressing to severe COVID-19 and/or hospitalization.  2. The significant known and potential risks and benefits of COVID monoclonal antibody, and the extent to which such potential risks and benefits are unknown.  3. Information on available alternative treatments and the risks and benefits of those alternatives, including clinical trials.  4. Patients treated with COVID monoclonal antibody should continue to self-isolate and use infection control measures (e.g., wear mask, isolate, social distance, avoid sharing personal items, clean and disinfect "high touch" surfaces, and frequent handwashing) according to CDC guidelines.   5. The patient or parent/caregiver has the option to accept or refuse COVID monoclonal antibody  treatment.  After reviewing this information with the patient, the patient has agreed to receive one of the available covid 19 monoclonal antibodies and will be provided an appropriate fact sheet prior to infusion. Asencion Gowda, NP 11/08/2019 6:34 PM

## 2019-11-09 ENCOUNTER — Ambulatory Visit (HOSPITAL_COMMUNITY)
Admission: RE | Admit: 2019-11-09 | Discharge: 2019-11-09 | Disposition: A | Discharge status: Home / Self Care | Payer: Medicare Other | Source: Ambulatory Visit | Attending: Pulmonary Disease | Admitting: Pulmonary Disease

## 2019-11-09 DIAGNOSIS — Z23 Encounter for immunization: Secondary | ICD-10-CM | POA: Insufficient documentation

## 2019-11-09 DIAGNOSIS — U071 COVID-19: Secondary | ICD-10-CM | POA: Insufficient documentation

## 2019-11-09 MED ORDER — FAMOTIDINE IN NACL 20-0.9 MG/50ML-% IV SOLN: 20.0000 mg | Freq: Once | INTRAVENOUS | Status: DC | PRN

## 2019-11-09 MED ORDER — ACETAMINOPHEN 325 MG PO TABS
650.0000 mg | ORAL_TABLET | Freq: Once | ORAL | Status: AC
  Administered 2019-11-09: 650 mg via ORAL
  Filled 2019-11-09: qty 2

## 2019-11-09 MED ORDER — SODIUM CHLORIDE 0.9 % IV SOLN: Freq: Once | INTRAVENOUS | Status: AC

## 2019-11-09 MED ORDER — EPINEPHRINE 0.3 MG/0.3ML IJ SOAJ: 0.3000 mg | Freq: Once | INTRAMUSCULAR | Status: DC | PRN

## 2019-11-09 MED ORDER — METHYLPREDNISOLONE SODIUM SUCC 125 MG IJ SOLR: 125.0000 mg | Freq: Once | INTRAMUSCULAR | Status: DC | PRN

## 2019-11-09 MED ORDER — DIPHENHYDRAMINE HCL 50 MG/ML IJ SOLN: 50.0000 mg | Freq: Once | INTRAMUSCULAR | Status: DC | PRN

## 2019-11-09 MED ORDER — ALBUTEROL SULFATE HFA 108 (90 BASE) MCG/ACT IN AERS: 2.0000 | INHALATION_SPRAY | Freq: Once | RESPIRATORY_TRACT | Status: DC | PRN

## 2019-11-09 MED ORDER — SODIUM CHLORIDE 0.9 % IV SOLN: INTRAVENOUS | Status: DC | PRN

## 2019-11-09 NOTE — Discharge Instructions (Signed)

## 2019-11-09 NOTE — Progress Notes (Signed)
  Diagnosis: COVID-19  Physician:Dr Joya Gaskins   Procedure: [facility] Med: casirivimab\imdevimab infusion - Provided patient with casirivimab\imdevimab fact sheet for patients, parents and caregivers prior to infusion.  Complications: No immediate complications noted.  Discharge: Discharged home   Vernia Buff 11/09/2019

## 2020-01-22 ENCOUNTER — Other Ambulatory Visit: Payer: Self-pay | Admitting: Family Medicine

## 2020-01-22 DIAGNOSIS — Z1231 Encounter for screening mammogram for malignant neoplasm of breast: Secondary | ICD-10-CM

## 2020-03-03 ENCOUNTER — Other Ambulatory Visit: Payer: Self-pay

## 2020-03-03 ENCOUNTER — Ambulatory Visit
Admission: RE | Admit: 2020-03-03 | Discharge: 2020-03-03 | Disposition: A | Discharge status: Home / Self Care | Payer: Medicare Other | Source: Ambulatory Visit | Attending: Family Medicine | Admitting: Family Medicine

## 2020-03-03 DIAGNOSIS — Z1231 Encounter for screening mammogram for malignant neoplasm of breast: Secondary | ICD-10-CM | POA: Diagnosis not present

## 2020-03-24 DIAGNOSIS — G20 Parkinson's disease: Secondary | ICD-10-CM | POA: Diagnosis not present

## 2020-03-24 DIAGNOSIS — Z853 Personal history of malignant neoplasm of breast: Secondary | ICD-10-CM | POA: Diagnosis not present

## 2020-03-24 DIAGNOSIS — K219 Gastro-esophageal reflux disease without esophagitis: Secondary | ICD-10-CM | POA: Diagnosis not present

## 2020-03-24 DIAGNOSIS — I10 Essential (primary) hypertension: Secondary | ICD-10-CM | POA: Diagnosis not present

## 2020-03-24 DIAGNOSIS — E785 Hyperlipidemia, unspecified: Secondary | ICD-10-CM | POA: Diagnosis not present

## 2020-03-24 DIAGNOSIS — G4700 Insomnia, unspecified: Secondary | ICD-10-CM | POA: Diagnosis not present

## 2020-05-01 DIAGNOSIS — G4700 Insomnia, unspecified: Secondary | ICD-10-CM | POA: Diagnosis not present

## 2020-05-01 DIAGNOSIS — K219 Gastro-esophageal reflux disease without esophagitis: Secondary | ICD-10-CM | POA: Diagnosis not present

## 2020-05-01 DIAGNOSIS — Z853 Personal history of malignant neoplasm of breast: Secondary | ICD-10-CM | POA: Diagnosis not present

## 2020-05-01 DIAGNOSIS — I10 Essential (primary) hypertension: Secondary | ICD-10-CM | POA: Diagnosis not present

## 2020-05-01 DIAGNOSIS — G20 Parkinson's disease: Secondary | ICD-10-CM | POA: Diagnosis not present

## 2020-05-01 DIAGNOSIS — E785 Hyperlipidemia, unspecified: Secondary | ICD-10-CM | POA: Diagnosis not present

## 2020-05-21 ENCOUNTER — Other Ambulatory Visit: Payer: Self-pay

## 2020-05-21 ENCOUNTER — Encounter: Payer: Self-pay | Admitting: Obstetrics & Gynecology

## 2020-05-21 ENCOUNTER — Ambulatory Visit: Payer: Medicare Other | Admitting: Obstetrics & Gynecology

## 2020-05-21 VITALS — BP 124/76 | HR 53 | Ht 66.0 in | Wt 193.4 lb

## 2020-05-21 DIAGNOSIS — L309 Dermatitis, unspecified: Secondary | ICD-10-CM | POA: Diagnosis not present

## 2020-05-21 DIAGNOSIS — R3989 Other symptoms and signs involving the genitourinary system: Secondary | ICD-10-CM

## 2020-05-21 MED ORDER — TRIAMCINOLONE ACETONIDE 0.5 % EX OINT: 1.0000 "application " | TOPICAL_OINTMENT | Freq: Every day | CUTANEOUS | 1 refills | Status: DC | PRN

## 2020-05-21 NOTE — Progress Notes (Signed)
   GYN VISIT Patient name: Katie Carrillo MRN [id number]  Date of birth: May 04, 1940 Chief Complaint:   Bladder concerns  History of Present Illness:   Katie Carrillo is a 80 y.o. postmenopausal, post-hyst female being seen today to establish care and follow up regarding:  Bladder concerns: She thinks she may have a UTI- reports urinary frequency and dysuria.  No hematuria.  She notes stress incontinence, but this is not a new problem.    Of note, she has some LLQ pain that radiates to her back.  Per pt pain is due to her diverticulitis- previously seen in ER 04/2019.   Abd CT completed- I reviewed no gyn abnormalities noted.  -Vulvar dermatitis- previously treated with Triamcinolone.  Today she notes that she will have a flare on occasion and will use the ointment.  She denies itching or irritation currently.  Uses the ointment every few mos.  In review- previously had vulvar abscess-2017- seen at [facility]     No LMP recorded. Patient is postmenopausal.  Depression screen [facility] 2/9 05/21/2020  Decreased Interest 0  Down, Depressed, Hopeless 0  PHQ - 2 Score 0  Altered sleeping 0  Tired, decreased energy 0  Change in appetite 0  Feeling bad or failure about yourself  0  Trouble concentrating 0  Moving slowly or fidgety/restless 0  PHQ-9 Score 0     Review of Systems:   Pertinent items are noted in HPI Denies fever/chills, dizziness, headaches, visual disturbances, fatigue, shortness of breath, chest pain, abdominal pain, vomiting,not sexually active.  Denies diarrhea, some issues with constipation. Pertinent History Reviewed:  Reviewed past medical,surgical, social, obstetrical and family history.  Reviewed problem list, medications and allergies. Physical Assessment:   Vitals:   05/21/20 1437  BP: 124/76  Pulse: (!) 53  Weight: 193 lb 6.4 oz (87.7 kg)  Height: 5\' 6"  (1.676 m)  Body mass index is 31.22 kg/m.       Physical Examination:   General appearance: alert,  well appearing, and in no distress  Psych: mood appropriate, normal affect  Skin: warm & dry   Cardiovascular: normal heart rate noted  Respiratory: normal respiratory effort, no distress  Abdomen: soft, non-tender   Pelvic: VULVA: normal appearing vulva with no masses- pea-sized purple varicosity noted on left labia, right labia minora with sebaceous cyst ~ 1cm in size- non-tender, no other abnormalities noted. VAGINA: normal appearing vagina with normal color and discharge, no lesions, uterus and cervix surgically absent, +rectocele, no adnexal masses or tenderness appreciated  Extremities: no edema   Chaperone: pt declined    Assessment & Plan:  1) Vulvar dermatitis- plan to refill triamcinolone and to use when needed  2) Suspected UTI- UA and culture to be collected, further management pending results  Orders Placed This Encounter  Procedures  . Urine Culture  . Urinalysis, Routine w reflex microscopic    Return in about 1 year (around 05/21/2021) for Annual.   Janyth Pupa, DO Attending [place], [place] for [facility], [place]

## 2020-05-22 ENCOUNTER — Telehealth: Payer: Self-pay

## 2020-05-22 LAB — URINALYSIS, ROUTINE W REFLEX MICROSCOPIC
Bilirubin, UA: NEGATIVE
Glucose, UA: NEGATIVE
Ketones, UA: NEGATIVE
Leukocytes,UA: NEGATIVE
Nitrite, UA: NEGATIVE
Protein,UA: NEGATIVE
RBC, UA: NEGATIVE
Specific Gravity, UA: 1.021 (ref 1.005–1.030)
Urobilinogen, Ur: 1 mg/dL (ref 0.2–1.0)
pH, UA: 6 (ref 5.0–7.5)

## 2020-05-22 NOTE — Telephone Encounter (Signed)
-----   Message from Janyth Pupa, DO sent at 05/22/2020  3:39 PM EDT ----- I know you didn't work with me today, but I also realize Tish has 1,000 things to do!! This lovely lady thought she had a UTI- please let her know that so far, her urine looks normal, there is no evidence of infection.  Thanks! Dr. Nelda Marseille ----- Message ----- From: Interface, [facility] Lab Results In Sent: 05/22/2020   5:37 AM EDT To: Janyth Pupa, DO

## 2020-05-22 NOTE — Telephone Encounter (Signed)
Called pt to inform her of test results. Pt confirms understanding.

## 2020-05-23 LAB — URINE CULTURE

## 2020-06-09 DIAGNOSIS — L03119 Cellulitis of unspecified part of limb: Secondary | ICD-10-CM | POA: Diagnosis not present

## 2020-06-24 DIAGNOSIS — I10 Essential (primary) hypertension: Secondary | ICD-10-CM | POA: Diagnosis not present

## 2020-06-24 DIAGNOSIS — G20 Parkinson's disease: Secondary | ICD-10-CM | POA: Diagnosis not present

## 2020-06-24 DIAGNOSIS — G4700 Insomnia, unspecified: Secondary | ICD-10-CM | POA: Diagnosis not present

## 2020-06-24 DIAGNOSIS — E785 Hyperlipidemia, unspecified: Secondary | ICD-10-CM | POA: Diagnosis not present

## 2020-06-24 DIAGNOSIS — K219 Gastro-esophageal reflux disease without esophagitis: Secondary | ICD-10-CM | POA: Diagnosis not present

## 2020-06-24 DIAGNOSIS — Z853 Personal history of malignant neoplasm of breast: Secondary | ICD-10-CM | POA: Diagnosis not present

## 2020-08-14 DIAGNOSIS — I10 Essential (primary) hypertension: Secondary | ICD-10-CM | POA: Diagnosis not present

## 2020-08-14 DIAGNOSIS — G20 Parkinson's disease: Secondary | ICD-10-CM | POA: Diagnosis not present

## 2020-08-14 DIAGNOSIS — Z853 Personal history of malignant neoplasm of breast: Secondary | ICD-10-CM | POA: Diagnosis not present

## 2020-08-14 DIAGNOSIS — K219 Gastro-esophageal reflux disease without esophagitis: Secondary | ICD-10-CM | POA: Diagnosis not present

## 2020-09-15 ENCOUNTER — Telehealth: Payer: Self-pay | Admitting: Obstetrics & Gynecology

## 2020-09-15 NOTE — Telephone Encounter (Signed)
Pt calling stating that she is have issues with controling her bladder and wanted to know if dr Nelda Marseille will send in myrcetriq to [facility]

## 2020-09-16 MED ORDER — OXYBUTYNIN CHLORIDE ER 10 MG PO TB24: 10.0000 mg | ORAL_TABLET | Freq: Every day | ORAL | 11 refills | Status: DC

## 2020-09-16 NOTE — Telephone Encounter (Signed)
Called patient back.  She reports this has been an ongoing issue for years.  She was previously on a medication to help with this, but stopped due to cost.  She wishes to restart on something less expensive.  Upon review of her medication, it looks as though she was previously on oxybutynin.  Reviewed medication list, currently not taking.  Plan to restart and have patient f/u for televisit in 3 mos.  Rx sent in to pharmacy

## 2020-09-16 NOTE — Addendum Note (Signed)
Addended by: Annalee Genta on: 09/16/2020 01:28 PM   Modules accepted: Orders

## 2020-09-19 DIAGNOSIS — K5900 Constipation, unspecified: Secondary | ICD-10-CM | POA: Diagnosis not present

## 2020-10-02 DIAGNOSIS — L03115 Cellulitis of right lower limb: Secondary | ICD-10-CM | POA: Diagnosis not present

## 2020-10-23 DIAGNOSIS — G4700 Insomnia, unspecified: Secondary | ICD-10-CM | POA: Diagnosis not present

## 2020-10-23 DIAGNOSIS — G20 Parkinson's disease: Secondary | ICD-10-CM | POA: Diagnosis not present

## 2020-10-23 DIAGNOSIS — K219 Gastro-esophageal reflux disease without esophagitis: Secondary | ICD-10-CM | POA: Diagnosis not present

## 2020-10-23 DIAGNOSIS — E785 Hyperlipidemia, unspecified: Secondary | ICD-10-CM | POA: Diagnosis not present

## 2020-10-23 DIAGNOSIS — I10 Essential (primary) hypertension: Secondary | ICD-10-CM | POA: Diagnosis not present

## 2020-10-24 DIAGNOSIS — D125 Benign neoplasm of sigmoid colon: Secondary | ICD-10-CM | POA: Diagnosis not present

## 2020-10-24 DIAGNOSIS — K5730 Diverticulosis of large intestine without perforation or abscess without bleeding: Secondary | ICD-10-CM | POA: Diagnosis not present

## 2020-10-24 DIAGNOSIS — K648 Other hemorrhoids: Secondary | ICD-10-CM | POA: Diagnosis not present

## 2020-10-24 DIAGNOSIS — D123 Benign neoplasm of transverse colon: Secondary | ICD-10-CM | POA: Diagnosis not present

## 2020-10-24 DIAGNOSIS — D124 Benign neoplasm of descending colon: Secondary | ICD-10-CM | POA: Diagnosis not present

## 2020-10-24 DIAGNOSIS — Z980 Intestinal bypass and anastomosis status: Secondary | ICD-10-CM | POA: Diagnosis not present

## 2020-10-24 DIAGNOSIS — D122 Benign neoplasm of ascending colon: Secondary | ICD-10-CM | POA: Diagnosis not present

## 2020-10-24 DIAGNOSIS — Z86010 Personal history of colonic polyps: Secondary | ICD-10-CM | POA: Diagnosis not present

## 2020-10-28 DIAGNOSIS — D124 Benign neoplasm of descending colon: Secondary | ICD-10-CM | POA: Diagnosis not present

## 2020-10-28 DIAGNOSIS — D123 Benign neoplasm of transverse colon: Secondary | ICD-10-CM | POA: Diagnosis not present

## 2020-10-28 DIAGNOSIS — D122 Benign neoplasm of ascending colon: Secondary | ICD-10-CM | POA: Diagnosis not present

## 2020-10-28 DIAGNOSIS — D125 Benign neoplasm of sigmoid colon: Secondary | ICD-10-CM | POA: Diagnosis not present

## 2020-12-02 DIAGNOSIS — D229 Melanocytic nevi, unspecified: Secondary | ICD-10-CM | POA: Diagnosis not present

## 2020-12-02 DIAGNOSIS — R8290 Unspecified abnormal findings in urine: Secondary | ICD-10-CM | POA: Diagnosis not present

## 2020-12-02 DIAGNOSIS — E785 Hyperlipidemia, unspecified: Secondary | ICD-10-CM | POA: Diagnosis not present

## 2020-12-02 DIAGNOSIS — R7303 Prediabetes: Secondary | ICD-10-CM | POA: Diagnosis not present

## 2020-12-02 DIAGNOSIS — Z0000 Encounter for general adult medical examination without abnormal findings: Secondary | ICD-10-CM | POA: Diagnosis not present

## 2020-12-02 DIAGNOSIS — G20 Parkinson's disease: Secondary | ICD-10-CM | POA: Diagnosis not present

## 2020-12-02 DIAGNOSIS — G4700 Insomnia, unspecified: Secondary | ICD-10-CM | POA: Diagnosis not present

## 2020-12-02 DIAGNOSIS — I10 Essential (primary) hypertension: Secondary | ICD-10-CM | POA: Diagnosis not present

## 2020-12-02 DIAGNOSIS — N3281 Overactive bladder: Secondary | ICD-10-CM | POA: Diagnosis not present

## 2020-12-02 DIAGNOSIS — G3184 Mild cognitive impairment, so stated: Secondary | ICD-10-CM | POA: Diagnosis not present

## 2020-12-09 DIAGNOSIS — G20 Parkinson's disease: Secondary | ICD-10-CM | POA: Diagnosis not present

## 2020-12-09 DIAGNOSIS — G3184 Mild cognitive impairment, so stated: Secondary | ICD-10-CM | POA: Diagnosis not present

## 2020-12-09 DIAGNOSIS — Z0000 Encounter for general adult medical examination without abnormal findings: Secondary | ICD-10-CM | POA: Diagnosis not present

## 2020-12-09 DIAGNOSIS — R7303 Prediabetes: Secondary | ICD-10-CM | POA: Diagnosis not present

## 2020-12-09 DIAGNOSIS — Z23 Encounter for immunization: Secondary | ICD-10-CM | POA: Diagnosis not present

## 2020-12-09 DIAGNOSIS — E785 Hyperlipidemia, unspecified: Secondary | ICD-10-CM | POA: Diagnosis not present

## 2020-12-09 DIAGNOSIS — I10 Essential (primary) hypertension: Secondary | ICD-10-CM | POA: Diagnosis not present

## 2021-01-27 ENCOUNTER — Other Ambulatory Visit: Payer: Self-pay | Admitting: Family Medicine

## 2021-01-27 DIAGNOSIS — Z1231 Encounter for screening mammogram for malignant neoplasm of breast: Secondary | ICD-10-CM

## 2021-03-04 ENCOUNTER — Ambulatory Visit
Admission: RE | Admit: 2021-03-04 | Discharge: 2021-03-04 | Disposition: A | Discharge status: Home / Self Care | Payer: Medicare Other | Source: Ambulatory Visit | Attending: Family Medicine | Admitting: Family Medicine

## 2021-03-04 DIAGNOSIS — Z1231 Encounter for screening mammogram for malignant neoplasm of breast: Secondary | ICD-10-CM | POA: Diagnosis not present

## 2021-03-17 DIAGNOSIS — L578 Other skin changes due to chronic exposure to nonionizing radiation: Secondary | ICD-10-CM | POA: Diagnosis not present

## 2021-03-17 DIAGNOSIS — L820 Inflamed seborrheic keratosis: Secondary | ICD-10-CM | POA: Diagnosis not present

## 2021-03-17 DIAGNOSIS — D485 Neoplasm of uncertain behavior of skin: Secondary | ICD-10-CM | POA: Diagnosis not present

## 2021-03-17 DIAGNOSIS — L821 Other seborrheic keratosis: Secondary | ICD-10-CM | POA: Diagnosis not present

## 2021-03-19 DIAGNOSIS — R300 Dysuria: Secondary | ICD-10-CM | POA: Diagnosis not present

## 2021-03-19 DIAGNOSIS — Z23 Encounter for immunization: Secondary | ICD-10-CM | POA: Diagnosis not present

## 2021-05-05 DIAGNOSIS — E785 Hyperlipidemia, unspecified: Secondary | ICD-10-CM | POA: Diagnosis not present

## 2021-05-05 DIAGNOSIS — G4700 Insomnia, unspecified: Secondary | ICD-10-CM | POA: Diagnosis not present

## 2021-05-05 DIAGNOSIS — I10 Essential (primary) hypertension: Secondary | ICD-10-CM | POA: Diagnosis not present

## 2021-05-05 DIAGNOSIS — G20 Parkinson's disease: Secondary | ICD-10-CM | POA: Diagnosis not present

## 2021-05-05 DIAGNOSIS — K219 Gastro-esophageal reflux disease without esophagitis: Secondary | ICD-10-CM | POA: Diagnosis not present

## 2021-05-14 ENCOUNTER — Ambulatory Visit: Payer: Medicare Other | Admitting: Obstetrics & Gynecology

## 2021-05-22 ENCOUNTER — Inpatient Hospital Stay (HOSPITAL_BASED_OUTPATIENT_CLINIC_OR_DEPARTMENT_OTHER)
Admission: EM | Admit: 2021-05-22 | Discharge: 2021-05-26 | DRG: 193 | Disposition: A | Discharge status: Home Health | Payer: Medicare Other | Attending: Internal Medicine | Admitting: Internal Medicine

## 2021-05-22 ENCOUNTER — Emergency Department (HOSPITAL_BASED_OUTPATIENT_CLINIC_OR_DEPARTMENT_OTHER): Payer: Medicare Other

## 2021-05-22 ENCOUNTER — Encounter (HOSPITAL_BASED_OUTPATIENT_CLINIC_OR_DEPARTMENT_OTHER): Payer: Self-pay

## 2021-05-22 ENCOUNTER — Other Ambulatory Visit: Payer: Self-pay

## 2021-05-22 DIAGNOSIS — G20A1 Parkinson's disease without dyskinesia, without mention of fluctuations: Secondary | ICD-10-CM | POA: Diagnosis present

## 2021-05-22 DIAGNOSIS — Z85038 Personal history of other malignant neoplasm of large intestine: Secondary | ICD-10-CM

## 2021-05-22 DIAGNOSIS — I10 Essential (primary) hypertension: Secondary | ICD-10-CM | POA: Diagnosis not present

## 2021-05-22 DIAGNOSIS — Z853 Personal history of malignant neoplasm of breast: Secondary | ICD-10-CM | POA: Diagnosis not present

## 2021-05-22 DIAGNOSIS — Z791 Long term (current) use of non-steroidal anti-inflammatories (NSAID): Secondary | ICD-10-CM | POA: Diagnosis not present

## 2021-05-22 DIAGNOSIS — Z923 Personal history of irradiation: Secondary | ICD-10-CM | POA: Diagnosis not present

## 2021-05-22 DIAGNOSIS — F0280 Dementia in other diseases classified elsewhere without behavioral disturbance: Secondary | ICD-10-CM | POA: Diagnosis present

## 2021-05-22 DIAGNOSIS — G20 Parkinson's disease: Secondary | ICD-10-CM | POA: Diagnosis present

## 2021-05-22 DIAGNOSIS — R54 Age-related physical debility: Secondary | ICD-10-CM | POA: Diagnosis present

## 2021-05-22 DIAGNOSIS — R059 Cough, unspecified: Secondary | ICD-10-CM | POA: Diagnosis not present

## 2021-05-22 DIAGNOSIS — Z20822 Contact with and (suspected) exposure to covid-19: Secondary | ICD-10-CM | POA: Diagnosis not present

## 2021-05-22 DIAGNOSIS — R0602 Shortness of breath: Secondary | ICD-10-CM | POA: Diagnosis not present

## 2021-05-22 DIAGNOSIS — Z96651 Presence of right artificial knee joint: Secondary | ICD-10-CM | POA: Diagnosis present

## 2021-05-22 DIAGNOSIS — J189 Pneumonia, unspecified organism: Principal | ICD-10-CM | POA: Diagnosis present

## 2021-05-22 DIAGNOSIS — Z885 Allergy status to narcotic agent status: Secondary | ICD-10-CM | POA: Diagnosis not present

## 2021-05-22 DIAGNOSIS — Z8616 Personal history of COVID-19: Secondary | ICD-10-CM | POA: Diagnosis not present

## 2021-05-22 DIAGNOSIS — Z79899 Other long term (current) drug therapy: Secondary | ICD-10-CM | POA: Diagnosis not present

## 2021-05-22 DIAGNOSIS — J9811 Atelectasis: Secondary | ICD-10-CM | POA: Diagnosis not present

## 2021-05-22 DIAGNOSIS — J9601 Acute respiratory failure with hypoxia: Secondary | ICD-10-CM | POA: Diagnosis not present

## 2021-05-22 HISTORY — DX: Malignant neoplasm of colon, unspecified: C18.9

## 2021-05-22 HISTORY — DX: Parkinson's disease without dyskinesia, without mention of fluctuations: G20.A1

## 2021-05-22 HISTORY — DX: Parkinson's disease: G20

## 2021-05-22 LAB — TROPONIN I (HIGH SENSITIVITY)
Troponin I (High Sensitivity): 7 ng/L (ref <18)
Troponin I (High Sensitivity): 6 ng/L (ref <18)

## 2021-05-22 LAB — COMPREHENSIVE METABOLIC PANEL
ALT: 11 U/L (ref 0–44)
AST: 19 U/L (ref 15–41)
Albumin: 4 g/dL (ref 3.5–5.0)
Alkaline Phosphatase: 56 U/L (ref 38–126)
Anion gap: 10 (ref 5–15)
BUN: 12 mg/dL (ref 8–23)
CO2: 27 mmol/L (ref 22–32)
Calcium: 8.9 mg/dL (ref 8.9–10.3)
Chloride: 102 mmol/L (ref 98–111)
Creatinine, Ser: 0.82 mg/dL (ref 0.44–1.00)
GFR, Estimated: >60 mL/min (ref >60)
Glucose, Bld: 117 mg/dL — ABNORMAL HIGH (ref 70–99)
Potassium: 3.7 mmol/L (ref 3.5–5.1)
Sodium: 139 mmol/L (ref 135–145)
Total Bilirubin: 0.6 mg/dL (ref 0.3–1.2)
Total Protein: 7.1 g/dL (ref 6.5–8.1)

## 2021-05-22 LAB — CBC WITH DIFFERENTIAL/PLATELET
Abs Immature Granulocytes: 0.01 10*3/uL (ref 0.00–0.07)
Basophils Absolute: 0 10*3/uL (ref 0.0–0.1)
Basophils Relative: 1 %
Eosinophils Absolute: 0 10*3/uL (ref 0.0–0.5)
Eosinophils Relative: 1 %
HCT: 41.2 % (ref 36.0–46.0)
Hemoglobin: 13.3 g/dL (ref 12.0–15.0)
Immature Granulocytes: 0 %
Lymphocytes Relative: 13 %
Lymphs Abs: 0.6 10*3/uL — ABNORMAL LOW (ref 0.7–4.0)
MCH: 30 pg (ref 26.0–34.0)
MCHC: 32.3 g/dL (ref 30.0–36.0)
MCV: 92.8 fL (ref 80.0–100.0)
Monocytes Absolute: 0.6 10*3/uL (ref 0.1–1.0)
Monocytes Relative: 12 %
Neutro Abs: 3.4 10*3/uL (ref 1.7–7.7)
Neutrophils Relative %: 73 %
Platelets: 170 10*3/uL (ref 150–400)
RBC: 4.44 MIL/uL (ref 3.87–5.11)
RDW: 14.1 % (ref 11.5–15.5)
WBC: 4.7 10*3/uL (ref 4.0–10.5)
nRBC: 0 % (ref 0.0–0.2)

## 2021-05-22 LAB — BRAIN NATRIURETIC PEPTIDE: B Natriuretic Peptide: 82.2 pg/mL (ref 0.0–100.0)

## 2021-05-22 LAB — RESP PANEL BY RT-PCR (FLU A&B, COVID) ARPGX2
Influenza A by PCR: NEGATIVE
Influenza B by PCR: NEGATIVE
SARS Coronavirus 2 by RT PCR: NEGATIVE

## 2021-05-22 MED ORDER — CARBIDOPA-LEVODOPA 25-100 MG PO TABS
1.0000 | ORAL_TABLET | Freq: Three times a day (TID) | ORAL | Status: DC
  Administered 2021-05-22 – 2021-05-26 (×11): 1 via ORAL
  Filled 2021-05-22 (×12): qty 1

## 2021-05-22 MED ORDER — SODIUM CHLORIDE 0.9 % IV SOLN
1.0000 g | Freq: Once | INTRAVENOUS | Status: AC
  Administered 2021-05-22: 1 g via INTRAVENOUS
  Filled 2021-05-22: qty 10

## 2021-05-22 MED ORDER — LISINOPRIL 10 MG PO TABS
10.0000 mg | ORAL_TABLET | Freq: Every day | ORAL | Status: DC
Start: 2021-05-23 — End: 2021-05-26
  Administered 2021-05-23 – 2021-05-26 (×4): 10 mg via ORAL
  Filled 2021-05-22 (×4): qty 1

## 2021-05-22 MED ORDER — PROPRANOLOL HCL 10 MG PO TABS
10.0000 mg | ORAL_TABLET | Freq: Two times a day (BID) | ORAL | Status: DC
  Administered 2021-05-22 – 2021-05-26 (×8): 10 mg via ORAL
  Filled 2021-05-22 (×8): qty 1

## 2021-05-22 MED ORDER — SODIUM CHLORIDE 0.9 % IV SOLN
500.0000 mg | INTRAVENOUS | Status: DC
  Administered 2021-05-23: 500 mg via INTRAVENOUS
  Filled 2021-05-22 (×2): qty 5

## 2021-05-22 MED ORDER — DONEPEZIL HCL 23 MG PO TABS
23.0000 mg | ORAL_TABLET | Freq: Every day | ORAL | Status: DC
  Administered 2021-05-22 – 2021-05-25 (×4): 23 mg via ORAL
  Filled 2021-05-22 (×4): qty 1

## 2021-05-22 MED ORDER — GUAIFENESIN-DM 100-10 MG/5ML PO SYRP
5.0000 mL | ORAL_SOLUTION | ORAL | Status: DC | PRN
  Administered 2021-05-22 – 2021-05-25 (×12): 5 mL via ORAL
  Filled 2021-05-22 (×12): qty 10

## 2021-05-22 MED ORDER — SODIUM CHLORIDE 0.9 % IV SOLN: INTRAVENOUS | Status: DC

## 2021-05-22 MED ORDER — IOHEXOL 350 MG/ML SOLN
100.0000 mL | Freq: Once | INTRAVENOUS | Status: AC | PRN
  Administered 2021-05-22: 75 mL via INTRAVENOUS

## 2021-05-22 MED ORDER — SODIUM CHLORIDE 0.9 % IV SOLN
500.0000 mg | Freq: Once | INTRAVENOUS | Status: AC
  Administered 2021-05-22: 500 mg via INTRAVENOUS
  Filled 2021-05-22: qty 5

## 2021-05-22 MED ORDER — SODIUM CHLORIDE 0.9 % IV SOLN
2.0000 g | INTRAVENOUS | Status: DC
  Administered 2021-05-23: 2 g via INTRAVENOUS
  Filled 2021-05-22 (×2): qty 20

## 2021-05-22 MED ORDER — LISINOPRIL-HYDROCHLOROTHIAZIDE 10-12.5 MG PO TABS: 0.5000 | ORAL_TABLET | Freq: Every day | ORAL | Status: DC

## 2021-05-22 MED ORDER — ALBUTEROL SULFATE (2.5 MG/3ML) 0.083% IN NEBU
2.5000 mg | INHALATION_SOLUTION | Freq: Once | RESPIRATORY_TRACT | Status: AC
  Administered 2021-05-22: 2.5 mg via RESPIRATORY_TRACT
  Filled 2021-05-22: qty 3

## 2021-05-22 MED ORDER — POLYETHYLENE GLYCOL 3350 17 G PO PACK
17.0000 g | PACK | Freq: Every day | ORAL | Status: DC
  Administered 2021-05-23 – 2021-05-26 (×4): 17 g via ORAL
  Filled 2021-05-22 (×4): qty 1

## 2021-05-22 MED ORDER — MEMANTINE HCL 10 MG PO TABS
10.0000 mg | ORAL_TABLET | Freq: Two times a day (BID) | ORAL | Status: DC
  Administered 2021-05-22 – 2021-05-26 (×8): 10 mg via ORAL
  Filled 2021-05-22 (×8): qty 1

## 2021-05-22 MED ORDER — OMEGA-3-ACID ETHYL ESTERS 1 G PO CAPS
1.0000 g | ORAL_CAPSULE | Freq: Every day | ORAL | Status: DC
  Administered 2021-05-23 – 2021-05-26 (×4): 1 g via ORAL
  Filled 2021-05-22 (×4): qty 1

## 2021-05-22 MED ORDER — ACETAMINOPHEN 325 MG PO TABS: 325.0000 mg | ORAL_TABLET | Freq: Three times a day (TID) | ORAL | Status: DC | PRN

## 2021-05-22 MED ORDER — HYDROCHLOROTHIAZIDE 12.5 MG PO TABS
12.5000 mg | ORAL_TABLET | Freq: Every day | ORAL | Status: DC
  Administered 2021-05-23 – 2021-05-26 (×4): 12.5 mg via ORAL
  Filled 2021-05-22 (×4): qty 1

## 2021-05-22 MED ORDER — ENOXAPARIN SODIUM 40 MG/0.4ML IJ SOSY
40.0000 mg | PREFILLED_SYRINGE | INTRAMUSCULAR | Status: DC
  Administered 2021-05-22 – 2021-05-25 (×4): 40 mg via SUBCUTANEOUS
  Filled 2021-05-22 (×4): qty 0.4

## 2021-05-22 NOTE — Assessment & Plan Note (Signed)
Continue lisinopril HCTZ 

## 2021-05-22 NOTE — ED Provider Notes (Signed)
? ?Emergency Department Provider Note ? ? ?I have reviewed the triage vital signs and the nursing notes. ? ? ?HISTORY ? ?Chief Complaint ?Cough ? ? ?HPI ?Katie Carrillo is a 81 y.o. female with PMH reviewed below presents to the emergency department for evaluation of nonproductive cough, generalized weakness and mild shortness of breath.  Symptoms were worsening over the past 2 days.  Patient had a lot of trouble sleeping last night and was feeling generally weak this morning.  She states it reminds her somewhat of when she had COVID in the past.  Her husband, at bedside, states that he had to assist her getting around the house this morning because of weakness.  No hemoptysis.  No vomiting or diarrhea.  Patient denies any chest or abdominal discomfort/pain/pressure. No radiation of symptoms or modifying factors.  ? ? ?Past Medical History:  ?Diagnosis Date  ? Cancer [facility])   ? breast      lumpectomy   lt  ? Colon cancer ([place])   ? Dementia   ? Hypertension   ? dr Loney Hering    [employer]  ? Parkinson's disease ([place])   ? Personal history of radiation therapy 2008  ? Urinary urgency   ? ? ?Review of Systems ? ?Constitutional: No fever/chills. Positive fatigue/weakness (generalized).  ?Eyes: No visual changes. ?ENT: No sore throat. ?Cardiovascular: Denies chest pain. ?Respiratory: Positive shortness of breath and cough.  ?Gastrointestinal: No abdominal pain.  No nausea, no vomiting.  No diarrhea.  No constipation. ?Genitourinary: Negative for dysuria. ?Musculoskeletal: Negative for back pain. ?Skin: Negative for rash. ?Neurological: Negative for headaches, focal weakness or numbness. ? ? ?____________________________________________ ? ? ?PHYSICAL EXAM: ? ?VITAL SIGNS: ?ED Triage Vitals [05/22/21 0908]  ?Enc Vitals Group  ?   BP (!) 162/87  ?   Pulse Rate 84  ?   Resp 14  ?   Temp 99.3 ?F (37.4 ?C)  ?   Temp Source Oral  ?   SpO2 90 %  ?   Weight 183 lb (83 kg)  ?   Height '5\' 8"'$  (1.727 m)  ? ?Constitutional: Alert and  oriented. Patient appears weak and fatigued but able to provide a history and participate in the exam.  ?Eyes: Conjunctivae are normal.  ?Head: Atraumatic. ?Nose: No congestion/rhinnorhea. ?Mouth/Throat: Mucous membranes are moist. ?Neck: No stridor.  ?Cardiovascular: Normal rate, regular rhythm. Good peripheral circulation. Grossly normal heart sounds.   ?Respiratory: Normal respiratory effort.  No retractions. Lungs with faint crackles at the bases. No wheezing.  ?Gastrointestinal: Soft and nontender. No distention.  ?Musculoskeletal: No gross deformities of extremities. ?Neurologic:  Normal speech and language.  ?Skin:  Skin is warm, dry and intact. No rash noted. ? ?____________________________________________ ?  ?LABS ?(all labs ordered are listed, but only abnormal results are displayed) ? ?Labs Reviewed  ?COMPREHENSIVE METABOLIC PANEL - Abnormal; Notable for the following components:  ?    Result Value  ? Glucose, Bld 117 (*)   ? All other components within normal limits  ?CBC WITH DIFFERENTIAL/PLATELET - Abnormal; Notable for the following components:  ? Lymphs Abs 0.6 (*)   ? All other components within normal limits  ?RESP PANEL BY RT-PCR (FLU A&B, COVID) [id number]  ?BRAIN NATRIURETIC PEPTIDE  ?TROPONIN I (HIGH SENSITIVITY)  ?TROPONIN I (HIGH SENSITIVITY)  ? ?____________________________________________ ? ?EKG ? ? EKG Interpretation ? ?Date/Time:  Friday May 22 2021 09:15:22 EDT ?Ventricular Rate:  74 ?PR Interval:  179 ?QRS Duration: 94 ?QT Interval:  390 ?QTC  Calculation: 433 ?R Axis:   54 ?Text Interpretation: Sinus rhythm Left ventricular hypertrophy Confirmed by Nanda Quinton [id number]) on 05/22/2021 9:33:59 AM ?  ? ?  ? ? ?____________________________________________ ? ?RADIOLOGY ? ?CT Angio Chest PE W and/or Wo Contrast ? ?Result Date: 05/22/2021 ?CLINICAL DATA:  High probability for pulmonary embolism. Cough with hypoxemia EXAM: CT ANGIOGRAPHY CHEST WITH CONTRAST TECHNIQUE: Multidetector CT imaging of the  chest was performed using the standard protocol during bolus administration of intravenous contrast. Multiplanar CT image reconstructions and MIPs were obtained to evaluate the vascular anatomy. RADIATION DOSE REDUCTION: This exam was performed according to the departmental dose-optimization program which includes automated exposure control, adjustment of the mA and/or kV according to patient size and/or use of iterative reconstruction technique. CONTRAST:  58m OMNIPAQUE IOHEXOL 350 MG/ML SOLN COMPARISON:  None. FINDINGS: Cardiovascular: Satisfactory opacification of the pulmonary arteries to the segmental level. No evidence of pulmonary embolism. Normal heart size. No pericardial effusion. Atheromatous calcification of the aorta. Limited opacification of the aorta and systemic arterial tree. Mediastinum/Nodes: Negative for adenopathy or mass. Lungs/Pleura: Patchy nodular and consolidative opacity in the right lower lobe. Mild patchy atelectasis in the bilateral lungs. No edema or effusion. Upper Abdomen: No acute finding Musculoskeletal: Generalized osteopenia. Multilevel bridging osteophyte Review of the MIP images confirms the above findings. IMPRESSION: 1. Right lower lobe pneumonia. 2. Negative for pulmonary embolism. 3. Aortic Atherosclerosis (ICD10-I70.0). Electronically Signed   By: JJorje GuildM.D.   On: 05/22/2021 11:43  ? ?DG Chest Portable 1 View ? ?Result Date: 05/22/2021 ?CLINICAL DATA:  81year old female with cough. EXAM: PORTABLE CHEST - 1 VIEW COMPARISON:  01/25/2019 FINDINGS: The mediastinal contours are within normal limits. No cardiomegaly. Atherosclerotic calcifications of the aortic arch. The lungs are clear bilaterally without evidence of focal consolidation, pleural effusion, or pneumothorax. No acute osseous abnormality. IMPRESSION: 1. No acute cardiopulmonary process. 2.  Aortic Atherosclerosis (ICD10-I70.0). Electronically Signed   By: DRuthann CancerM.D.   On: 05/22/2021 09:39    ? ?____________________________________________ ? ? ?PROCEDURES ? ?Procedure(s) performed:  ? ?Procedures ? ?None  ?____________________________________________ ? ? ?INITIAL IMPRESSION / ASSESSMENT AND PLAN / ED COURSE ? ?Pertinent labs & imaging results that were available during my care of the patient were reviewed by me and considered in my medical decision making (see chart for details). ?  ?This patient is Presenting for Evaluation of SOB, which does require a range of treatment options, and is a complaint that involves a high risk of morbidity and mortality. ? ?The Differential Diagnoses  includes but is not exclusive to acute coronary syndrome, aortic dissection, pulmonary embolism, cardiac tamponade, community-acquired pneumonia, pericarditis, musculoskeletal chest wall pain, etc. ? ? ?Critical Interventions-  ?  ?Medications  ?0.9 %  sodium chloride infusion ( Intravenous New Bag/Given 05/22/21 1158)  ?iohexol (OMNIPAQUE) 350 MG/ML injection 100 mL (75 mLs Intravenous Contrast Given 05/22/21 1116)  ?cefTRIAXone (ROCEPHIN) 1 g in sodium chloride 0.9 % 100 mL IVPB (0 g Intravenous Stopped 05/22/21 1306)  ?azithromycin (ZITHROMAX) 500 mg in sodium chloride 0.9 % 250 mL IVPB (500 mg Intravenous New Bag/Given 05/22/21 1307)  ?albuterol (PROVENTIL) (2.5 MG/3ML) 0.083% nebulizer solution 2.5 mg (2.5 mg Nebulization Given 05/22/21 1336)  ? ? ?Reassessment after intervention: Patient remains HDS. Attempted to wean O2 but desaturated into the high 80s. Patient remains on 2L [place].  ? ? ?I did obtain Additional Historical Information from husband at bedside. ? ?I decided to review pertinent External Data, and in summary no recent  ED visits or hospitalization in our system. ?  ?Clinical Laboratory Tests Ordered, included Troponin is normal. Normal BNP. No AKI. No leukocytosis.  ? ?Radiologic Tests Ordered, included CXR and CTA PE. I independently interpreted the images and agree with radiology interpretation.  ? ?Cardiac  Monitor Tracing which shows NSR. ? ? ?Social Determinants of Health Risk NSR. ? ?Consult complete with Hospitalist, Dr. Olevia Bowens. Plan for admit.  ? ?Medical Decision Making: Summary:  ?Patient presents to the e

## 2021-05-22 NOTE — ED Notes (Signed)
Reports given to [facility] 

## 2021-05-22 NOTE — Assessment & Plan Note (Signed)
Presented with oxygen saturation of 90% and placed on 2 L.  Maintain O2 greater than 92% and wean as tolerated ?-Continue IV Rocephin and azithromycin ?

## 2021-05-22 NOTE — Assessment & Plan Note (Signed)
Continue home Sinemet, memantine and donezepil ?

## 2021-05-22 NOTE — ED Triage Notes (Signed)
Pt reports up all night coughing. Non productive and deep x 2 days No SOB ?

## 2021-05-22 NOTE — Progress Notes (Signed)
Plan of Care Note for accepted transfer ? ? ?Patient: Katie Carrillo MRN: [id number]   DOA: 05/22/2021 ? ?Facility requesting transfer: [facility].  ?Requesting Provider: Nanda Quinton, MD ?Reason for transfer: Community-acquired pneumonia. ?Facility course:  ?81 year old female with a past medical history of dementia, Parkinson's disease, osteoarthritis of the knee, diverticulosis, diverticulitis, hypertension, colon cancer, urinary urgency who presented to the emergency department with nonproductive cough with work-up showing right lower lobe pneumonia on imaging. ? ?Plan of care: ?The patient is accepted for admission to Telemetry unit, at [facility]..  ? ?Author: ?Reubin Milan, MD ?05/22/2021 ? ?Check [email or web address] for on-call coverage. ? ?Nursing staff, Please call [place] number on Amion as soon as patient's arrival, so appropriate admitting provider can evaluate the pt. ?

## 2021-05-22 NOTE — H&P (Signed)
?History and Physical  ? ? ?Patient: Katie Carrillo CWC:[id number] DOB: 04-Oct-1940 ?DOA: 05/22/2021 ?DOS: the patient was seen and examined on 05/22/2021 ?PCP: Lawerance Cruel, MD  ?Patient coming from: Home ? ?Chief Complaint:  ?Chief Complaint  ?Patient presents with  ? Cough  ? ?HPI: Katie Carrillo is a 81 y.o. female with medical history significant of Parkinson's disease, dementia, hypertension, remote history of breast and colon cancer who presents as a transfer from [facility] for right lower lobe pneumonia. ? ?Patient reports that about 3 days ago she began develop a productive cough and runny nose with some mild increasing shortness of breath.  It is progressively worsened and she became very weak today prompting her to present to the ED.  She denies any chest pain or lower extremity edema. ?No sick contact ? ?In the ED, she hypertensive up to SBP of 169/95 oxygen saturation of 90% and placed on 2 L via nasal cannula.  No leukocytosis or anemia.  CMP is unremarkable.  Negative flu/COVID PCR.  CTA chest was negative for pulmonary embolism but showed right lower lobe pneumonia.  She was started on IV Rocephin and azithromycin and transferred for further hospitalist management. ? ? ?Review of Systems: As mentioned in the history of present illness. All other systems reviewed and are negative. ?Past Medical History:  ?Diagnosis Date  ? Cancer [facility])   ? breast      lumpectomy   lt  ? Colon cancer ([place])   ? Dementia   ? Hypertension   ? dr Loney Hering    [employer]  ? Parkinson's disease ([place])   ? Personal history of radiation therapy 2008  ? Urinary urgency   ? ?Past Surgical History:  ?Procedure Laterality Date  ? ABDOMINAL HYSTERECTOMY    ? BREAST BIOPSY Left 01/2006  ? BREAST EXCISIONAL BIOPSY Right   ? BREAST LUMPECTOMY Left 02/2006  ? BREAST SURGERY    ? lt  ? hemmoriods    ? TOTAL KNEE ARTHROPLASTY  06/30/2011  ? Procedure: TOTAL KNEE ARTHROPLASTY;  Surgeon: Kerin Salen, MD;  Location: [place];  Service:  Orthopedics;  Laterality: Right;  ? ?Social History:  reports that she has never smoked. She has never used smokeless tobacco. She reports that she does not drink alcohol and does not use drugs. ? ?Allergies  ?Allergen Reactions  ? Morphine And Related Other (See Comments)  ?  Hallucinations   ? ? ?No family history on file. ? ?Prior to Admission medications   ?Medication Sig Start Date End Date Taking? Authorizing Provider  ?acetaminophen (TYLENOL) 325 MG tablet Take 325-650 mg by mouth every 8 (eight) hours as needed for mild pain or headache.    Yes [provider]  ?carbidopa-levodopa (SINEMET IR) 25-100 MG tablet Take 1 tablet by mouth 3 (three) times daily.   Yes [provider]  ?donepezil (ARICEPT) 23 MG TABS tablet Take 23 mg by mouth at bedtime.   Yes [provider]  ?lidocaine (LIDODERM) 5 % Place 1 patch onto the skin daily. Apply to painful area for 12 hours each day or night as needed. ?Patient taking differently: Place 1 patch onto the skin daily as needed (pain). 03/14/17  Yes Tanna Furry, MD  ?lisinopril-hydrochlorothiazide (PRINZIDE,ZESTORETIC) 10-12.5 MG per tablet Take 0.5 tablets by mouth daily. 08/30/11  Yes [provider]  ?memantine (NAMENDA) 10 MG tablet Take 10 mg by mouth 2 (two) times daily.   Yes [provider]  ?Multiple Vitamin (MULITIVITAMIN  WITH MINERALS) TABS Take 1 tablet by mouth daily.   Yes [provider]  ?Omega-3 Fatty Acids (FISH OIL) 1200 MG CAPS Take 1,200 mg by mouth every evening.   Yes [provider]  ?polyethylene glycol (MIRALAX / GLYCOLAX) 17 g packet Take 17 g by mouth daily.   Yes [provider]  ?propranolol (INDERAL) 10 MG tablet Take 10 mg by mouth 2 (two) times daily.   Yes [provider]  ?Red Yeast Rice 600 MG CAPS Take 1,200 mg by mouth every evening.    Yes [provider]  ?traMADol (ULTRAM) 50 MG tablet Take 1 tablet (50 mg total) by mouth every 12 (twelve) hours  as needed for moderate pain. 03/14/17  Yes Tanna Furry, MD  ?ciprofloxacin (CIPRO) 500 MG tablet Take 1 tablet (500 mg total) by mouth 2 (two) times daily. One po bid x 7 days ?Patient not taking: Reported on 09/16/2020 04/06/19   Veryl Speak, MD  ?HYDROcodone-acetaminophen (NORCO) 5-325 MG tablet Take 1-2 tablets by mouth every 6 (six) hours as needed. ?Patient not taking: Reported on 09/16/2020 04/06/19   Veryl Speak, MD  ?meloxicam (MOBIC) 7.5 MG tablet Take 1 tablet (7.5 mg total) by mouth daily. ?Patient not taking: Reported on 05/22/2021 03/14/17   Tanna Furry, MD  ?metroNIDAZOLE (FLAGYL) 500 MG tablet Take 1 tablet (500 mg total) by mouth 3 (three) times daily. One po tid x 7 days ?Patient not taking: Reported on 05/21/2020 04/06/19   Veryl Speak, MD  ?oxybutynin (DITROPAN XL) 10 MG 24 hr tablet Take 1 tablet (10 mg total) by mouth at bedtime. ?Patient not taking: Reported on 05/22/2021 09/16/20 10/16/20  Janyth Pupa, DO  ?oxyCODONE-acetaminophen (PERCOCET/ROXICET) 5-325 MG tablet Take 1 tablet by mouth every 6 (six) hours as needed for severe pain. ?Patient not taking: Reported on 01/27/2019 06/16/16   Mackuen, Courteney Lyn, MD  ?triamcinolone ointment (KENALOG) 0.5 % Apply 1 application topically daily as needed. Pea-sized amount to affected area when needed ?Patient not taking: Reported on 05/22/2021 05/21/20   Janyth Pupa, DO  ? ? ?Physical Exam: ?Vitals:  ? 05/22/21 1600 05/22/21 1630 05/22/21 1742 05/22/21 2155  ?BP: (!) 143/74 (!) 164/90 (!) 166/82 (!) 169/95  ?Pulse: 66 84 68 70  ?Resp: '14 10 19 20  '$ ?Temp:  98.8 ?F (37.1 ?C) 98.7 ?F (37.1 ?C) 98.7 ?F (37.1 ?C)  ?TempSrc:   Oral Oral  ?SpO2: 95%  97% 97%  ?Weight:      ?Height:      ? ?Constitutional: NAD, calm, comfortable, nontoxic appearing elderly female initially asleep sitting upright in bed ?Eyes: lids and conjunctivae normal ?ENMT: Mucous membranes are moist.  ?Neck: normal, supple, ?Respiratory: clear to auscultation bilaterally, no wheezing, no  crackles. Normal respiratory effort. No accessory muscle use.  Able to speak in full sentences.  On 2 L via nasal cannula. ?Cardiovascular: Regular rate and rhythm, no murmurs / rubs / gallops. No extremity edema. 2+ pedal pulses. ?Abdomen: no tenderness, Bowel sounds positive.  ?Musculoskeletal: no clubbing / cyanosis. No joint deformity upper and lower extremities.  ?Skin: no rashes, lesions, ulcers. ?Neurologic: CN 2-12 grossly intact.  4 out of 5 strength of the right lower extremity -has history of surgical intervention for right knee ?psychiatric: Normal judgment and insight. Alert and oriented x 3. Normal mood. ?Data Reviewed: ? ?See HPI ? ?Assessment and Plan: ?* CAP (community acquired pneumonia) ?Presented with oxygen saturation of 90% and placed on 2 L.  Maintain O2 greater than  92% and wean as tolerated ?-Continue IV Rocephin and azithromycin ? ?Essential hypertension ?Continue lisinopril-HCTZ ? ?Parkinson's disease ([place]) ?Continue home Sinemet, memantine and donezepil ? ? ? ? ? Advance Care Planning:   Code Status: Full Code  ? ?Consults: None ? ?Family Communication: No family at bedside ? ?Severity of Illness: ?The appropriate patient status for this patient is INPATIENT. Inpatient status is judged to be reasonable and necessary in order to provide the required intensity of service to ensure the patient's safety. The patient's presenting symptoms, physical exam findings, and initial radiographic and laboratory data in the context of their chronic comorbidities is felt to place them at high risk for further clinical deterioration. Furthermore, it is not anticipated that the patient will be medically stable for discharge from the hospital within 2 midnights of admission.  ? ?* I certify that at the point of admission it is my clinical judgment that the patient will require inpatient hospital care spanning beyond 2 midnights from the point of admission due to high intensity of service, high risk for  further deterioration and high frequency of surveillance required.* ? ?Author: Orene Desanctis, DO ?05/22/2021 10:00 PM ? ?For on call review [email or web address].  ?

## 2021-05-23 DIAGNOSIS — J9601 Acute respiratory failure with hypoxia: Secondary | ICD-10-CM

## 2021-05-23 LAB — CBC
HCT: 38.1 % (ref 36.0–46.0)
Hemoglobin: 12.3 g/dL (ref 12.0–15.0)
MCH: 30.4 pg (ref 26.0–34.0)
MCHC: 32.3 g/dL (ref 30.0–36.0)
MCV: 94.3 fL (ref 80.0–100.0)
Platelets: 151 10*3/uL (ref 150–400)
RBC: 4.04 MIL/uL (ref 3.87–5.11)
RDW: 14.4 % (ref 11.5–15.5)
WBC: 4.5 10*3/uL (ref 4.0–10.5)
nRBC: 0 % (ref 0.0–0.2)

## 2021-05-23 MED ORDER — SODIUM CHLORIDE 0.9 % IV SOLN: INTRAVENOUS | Status: AC

## 2021-05-23 MED ORDER — ALBUTEROL SULFATE (2.5 MG/3ML) 0.083% IN NEBU: 2.5000 mg | INHALATION_SOLUTION | Freq: Four times a day (QID) | RESPIRATORY_TRACT | Status: DC | PRN

## 2021-05-23 NOTE — Progress Notes (Signed)
[facility] ?PROGRESS NOTE ? ? ? ?Progress Note  ?Katie Carrillo  XVQ:[id number] DOB: [birth year] DOA: 05/22/2021 ?PCP: Lawerance Cruel, MD  ? ? ? ?Brief Narrative:  ? ?Katie Carrillo is an 81 y.o. female past medical history significant Parkinson's disease, essential hypertension remote history of breast cancer and colon cancer presents for right lower lobe pneumonia, in the ED was satting 90% on 2 L no leukocytosis, SARS-CoV-2 and influenza PCR were negative CT of the head showed no PE but showed right lower lobe infiltrate started empirically on antibiotics ? ?Assessment/Plan:  ? ?Acute respiratory failure with hypoxia secondary to CAP (community acquired pneumonia): ?Continue 2 L of oxygen to keep saturations greater 90%. ?Continue IV empiric Rocephin and azithromycin. ?Tmax 99.5. ? ?Parkinson's disease ([place]) ?Continue Sinemet memantine and donezepil. ? ?Essential hypertension: ?continue lisinopril and hydrochlorothiazide ? ?DVT prophylaxis: lovenox ?Family Communication:none ?Status is: Inpatient ?Remains inpatient appropriate because: Acute respiratory failure with hypoxia secondary to pneumonia ? ? ? ?Code Status:  ? ?  ?Code Status Orders  ?(From admission, onward)  ?  ? ? ?  ? ?  Start     Ordered  ? 05/22/21 2154  Full code  Continuous       ? 05/22/21 2154  ? ?  ?  ? ?  ? ?Code Status History   ? ? Date Active Date Inactive Code Status Order ID Comments User Context  ? 01/26/2019 0131 01/29/2019 1954 Full Code [id number]  Etta Quill, DO ED  ? 06/30/2011 1736 07/04/2011 1726 Full Code [id number]  Cyndra Numbers, RN Inpatient  ? ?  ? ?Advance Directive Documentation   ? ?Flowsheet Row Most Recent Value  ?Type of Advance Directive Healthcare Power of [place] Living will  ?Pre-existing out of facility DNR order (yellow form or pink MOST form) --  ?"MOST" Form in Place? --  ? ?  ? ? ? ? ?IV Access:  ? ?Peripheral IV ? ? ?Procedures and diagnostic studies:  ? ?CT Angio Chest PE W and/or Wo  Contrast ? ?Result Date: 05/22/2021 ?CLINICAL DATA:  High probability for pulmonary embolism. Cough with hypoxemia EXAM: CT ANGIOGRAPHY CHEST WITH CONTRAST TECHNIQUE: Multidetector CT imaging of the chest was performed using the standard protocol during bolus administration of intravenous contrast. Multiplanar CT image reconstructions and MIPs were obtained to evaluate the vascular anatomy. RADIATION DOSE REDUCTION: This exam was performed according to the departmental dose-optimization program which includes automated exposure control, adjustment of the mA and/or kV according to patient size and/or use of iterative reconstruction technique. CONTRAST:  45m OMNIPAQUE IOHEXOL 350 MG/ML SOLN COMPARISON:  None. FINDINGS: Cardiovascular: Satisfactory opacification of the pulmonary arteries to the segmental level. No evidence of pulmonary embolism. Normal heart size. No pericardial effusion. Atheromatous calcification of the aorta. Limited opacification of the aorta and systemic arterial tree. Mediastinum/Nodes: Negative for adenopathy or mass. Lungs/Pleura: Patchy nodular and consolidative opacity in the right lower lobe. Mild patchy atelectasis in the bilateral lungs. No edema or effusion. Upper Abdomen: No acute finding Musculoskeletal: Generalized osteopenia. Multilevel bridging osteophyte Review of the MIP images confirms the above findings. IMPRESSION: 1. Right lower lobe pneumonia. 2. Negative for pulmonary embolism. 3. Aortic Atherosclerosis (ICD10-I70.0). Electronically Signed   By: JJorje GuildM.D.   On: 05/22/2021 11:43  ? ?DG Chest Portable 1 View ? ?Result Date: 05/22/2021 ?CLINICAL DATA:  81year old female with cough. EXAM: PORTABLE CHEST - 1 VIEW COMPARISON:  01/25/2019 FINDINGS: The mediastinal contours are within normal limits. No  cardiomegaly. Atherosclerotic calcifications of the aortic arch. The lungs are clear bilaterally without evidence of focal consolidation, pleural effusion, or pneumothorax.  No acute osseous abnormality. IMPRESSION: 1. No acute cardiopulmonary process. 2.  Aortic Atherosclerosis (ICD10-I70.0). Electronically Signed   By: Ruthann Cancer M.D.   On: 05/22/2021 09:39   ? ? ?Medical Consultants:  ? ?None. ? ? ?Subjective:  ? ? ?Katie Carrillo she relates she still feels tired and fatigued continues to have a persistent cough ? ?Objective:  ? ? ?Vitals:  ? 05/22/21 2155 05/23/21 0158 05/23/21 0529 05/23/21 0728  ?BP: (!) 169/95 133/87 127/68 123/72  ?Pulse: 70 60 (!) 54 (!) 58  ?Resp: '20 19 19   '$ ?Temp: 98.7 ?F (37.1 ?C) 98.9 ?F (37.2 ?C) 98.6 ?F (37 ?C) 98.1 ?F (36.7 ?C)  ?TempSrc: Oral Oral Oral Oral  ?SpO2: 97% 97% 96% 94%  ?Weight:      ?Height:      ? ?SpO2: 94 % ?O2 Flow Rate (L/min): 2 L/min ? ? ?Intake/Output Summary (Last 24 hours) at 05/23/2021 0803 ?Last data filed at 05/23/2021 0244 ?Gross per 24 hour  ?Intake 1307.43 ml  ?Output 1650 ml  ?Net -342.57 ml  ? ?Filed Weights  ? 05/22/21 0908  ?Weight: 83 kg  ? ? ?Exam: ?General exam: In no acute distress. ?Respiratory system: Good air movement and clear to auscultation. ?Cardiovascular system: S1 & S2 heard, RRR. No JVD. ?Gastrointestinal system: Abdomen is nondistended, soft and nontender.  ?Extremities: No pedal edema. ?Skin: No rashes, lesions or ulcers ?Psychiatry: Judgement and insight appear normal. Mood & affect appropriate.  ? ? ?Data Reviewed:  ? ? ?Labs: ?Basic Metabolic Panel: ?Recent Labs  ?Lab 05/22/21 ?0941  ?NA 139  ?K 3.7  ?CL 102  ?CO2 27  ?GLUCOSE 117*  ?BUN 12  ?CREATININE 0.82  ?CALCIUM 8.9  ? ?GFR ?Estimated Creatinine Clearance: 61.8 mL/min (by C-G formula based on SCr of 0.82 mg/dL). ?Liver Function Tests: ?Recent Labs  ?Lab 05/22/21 ?0941  ?AST 19  ?ALT 11  ?ALKPHOS 56  ?BILITOT 0.6  ?PROT 7.1  ?ALBUMIN 4.0  ? ?No results for input(s): LIPASE, AMYLASE in the last 168 hours. ?No results for input(s): AMMONIA in the last 168 hours. ?Coagulation profile ?No results for input(s): INR, PROTIME in the last 168  hours. ?COVID-19 Labs ? ?No results for input(s): DDIMER, FERRITIN, LDH, CRP in the last 72 hours. ? ?Lab Results  ?Component Value Date  ? [place] NEGATIVE 05/22/2021  ? [place] NEGATIVE 01/26/2019  ? ? ?CBC: ?Recent Labs  ?Lab 05/22/21 ?0539 05/23/21 ?7673  ?WBC 4.7 4.5  ?NEUTROABS 3.4  --   ?HGB 13.3 12.3  ?HCT 41.2 38.1  ?MCV 92.8 94.3  ?PLT 170 151  ? ?Cardiac Enzymes: ?No results for input(s): CKTOTAL, CKMB, CKMBINDEX, TROPONINI in the last 168 hours. ?BNP (last 3 results) ?No results for input(s): PROBNP in the last 8760 hours. ?CBG: ?No results for input(s): GLUCAP in the last 168 hours. ?D-Dimer: ?No results for input(s): DDIMER in the last 72 hours. ?Hgb A1c: ?No results for input(s): HGBA1C in the last 72 hours. ?Lipid Profile: ?No results for input(s): CHOL, HDL, LDLCALC, TRIG, CHOLHDL, LDLDIRECT in the last 72 hours. ?Thyroid function studies: ?No results for input(s): TSH, T4TOTAL, T3FREE, THYROIDAB in the last 72 hours. ? ?Invalid input(s): FREET3 ?Anemia work up: ?No results for input(s): VITAMINB12, FOLATE, FERRITIN, TIBC, IRON, RETICCTPCT in the last 72 hours. ?Sepsis Labs: ?Recent Labs  ?Lab 05/22/21 ?4193 05/23/21 ?7902  ?WBC  4.7 4.5  ? ?Microbiology ?Recent Results (from the past 240 hour(s))  ?Resp Panel by RT-PCR (Flu A&B, Covid) Nasopharyngeal Swab     Status: None  ? Collection Time: 05/22/21  9:37 AM  ? Specimen: Nasopharyngeal Swab; Nasopharyngeal(NP) swabs in vial transport medium  ?Result Value Ref Range Status  ? SARS Coronavirus 2 by RT PCR NEGATIVE NEGATIVE Final  ?  Comment: (NOTE) ?SARS-CoV-2 target nucleic acids are NOT DETECTED. ? ?The SARS-CoV-2 RNA is generally detectable in upper respiratory ?specimens during the acute phase of infection. The lowest ?concentration of SARS-CoV-2 viral copies this assay can detect is ?138 copies/mL. A negative result does not preclude SARS-Cov-2 ?infection and should not be used as the sole basis for treatment or ?other patient management  decisions. A negative result may occur with  ?improper specimen collection/handling, submission of specimen other ?than nasopharyngeal swab, presence of viral mutation(s) within the ?areas targeted by this assay, and

## 2021-05-24 MED ORDER — CEFDINIR 300 MG PO CAPS
300.0000 mg | ORAL_CAPSULE | Freq: Two times a day (BID) | ORAL | Status: DC
  Administered 2021-05-24 – 2021-05-26 (×5): 300 mg via ORAL
  Filled 2021-05-24 (×5): qty 1

## 2021-05-24 MED ORDER — AZITHROMYCIN 250 MG PO TABS
500.0000 mg | ORAL_TABLET | Freq: Every day | ORAL | Status: DC
  Administered 2021-05-24 – 2021-05-26 (×3): 500 mg via ORAL
  Filled 2021-05-24 (×3): qty 2

## 2021-05-24 NOTE — TOC Initial Note (Signed)
Transition of Care (TOC) - Initial/Assessment Note  ? ? ?Patient Details  ?Name: Katie Carrillo ?MRN: [id number] ?Date of Birth: Sep 02, 1940 ? ?Transition of Care (TOC) CM/SW Contact:    ?Tawanna Cooler, RN ?Phone Number: ?05/24/2021, 3:10 PM ? ?Clinical Narrative:                 ? ?Patient from home with her husband.   ?PT eval pending.   ?TOC following for potential discharge needs.  ? ?Expected Discharge Plan: Home/Self Care ?Barriers to Discharge: Continued Medical Work up ? ? ?Expected Discharge Plan and Services ?Expected Discharge Plan: Home/Self Care ?   ?Living arrangements for the past 2 months: [place] ?                ?  ?Prior Living Arrangements/Services ?Living arrangements for the past 2 months: [place] ?Lives with:: Spouse ?Patient language and need for interpreter reviewed:: Yes ?       ?Need for Family Participation in Patient Care: Yes (Comment) ?Care giver support system in place?: Yes (comment) ?  ?Criminal Activity/Legal Involvement Pertinent to Current Situation/Hospitalization: No - Comment as needed ? ?Activities of Daily Living ?Home Assistive Devices/Equipment: Kasandra Knudsen (specify quad or straight), Eyeglasses ?ADL Screening (condition at time of admission) ?Patient's cognitive ability adequate to safely complete daily activities?: Yes ?Is the patient deaf or have difficulty hearing?: No ?Does the patient have difficulty seeing, even when wearing glasses/contacts?: No ?Does the patient have difficulty concentrating, remembering, or making decisions?: No ?Patient able to express need for assistance with ADLs?: Yes ?Does the patient have difficulty dressing or bathing?: No ?Independently performs ADLs?: No ?Communication: Independent ?Dressing (OT): Independent ?Grooming: Independent ?Feeding: Independent ?Bathing: Independent ?Toileting: Independent ?In/Out Bed: Needs assistance ?Walks in Home: Independent ?Does the patient have difficulty walking or climbing stairs?:  No ?Weakness of Legs: Both ?Weakness of Arms/Hands: Both ? ?Emotional Assessment ?  ? Alcohol / Substance Use: Not Applicable ?Psych Involvement: No (comment) ? ?Admission diagnosis:  CAP (community acquired pneumonia) [J18.9] ?Community acquired pneumonia of right lower lobe of lung [J18.9] ?Patient Active Problem List  ? Diagnosis Date Noted  ? Acute respiratory failure with hypoxia ([place]) 05/23/2021  ? CAP (community acquired pneumonia) 05/22/2021  ? Essential hypertension 05/22/2021  ? Diverticulitis 01/26/2019  ? Dementia ([place]) 01/26/2019  ? Parkinson's disease ([place]) 01/26/2019  ? Acute diverticulitis 01/26/2019  ? Dense breast tissue 03/02/2014  ? DCIS (ductal carcinoma in situ) of breast 09/30/2011  ? Osteoarthritis of right knee 07/02/2011  ? ?PCP:  Lawerance Cruel, MD ?Pharmacy:   ?[facility] - [place] - [address] ?[address] ?[facility] [place] [address] ?Phone: [phone] Fax: [phone] ? ?[id number]- [place], [employer] - [address] ?[address] ?[place] [id number]?Phone: [phone]: [phone]? ? ?

## 2021-05-24 NOTE — Progress Notes (Signed)
[facility] ?PROGRESS NOTE ? ? ? ?Progress Note  ?Katie Carrillo  DJT:[id number] DOB: Dec 11, 1940 DOA: 05/22/2021 ?PCP: Lawerance Cruel, MD  ? ? ? ?Brief Narrative:  ? ?Katie Carrillo is an 81 y.o. female past medical history significant Parkinson's disease, essential hypertension remote history of breast cancer and colon cancer presents for right lower lobe pneumonia, in the ED was satting 90% on 2 L no leukocytosis, SARS-CoV-2 and influenza PCR were negative CT of the head showed no PE but showed right lower lobe infiltrate started empirically on antibiotics ? ?Assessment/Plan:  ? ?Acute respiratory failure with hypoxia secondary to CAP (community acquired pneumonia): ?Try to wean to room air, transition antibiotics to oral she has remained afebrile. ?Physical therapy evaluation is pending. ? ?Parkinson's disease ([place]) ?Continue Sinemet memantine and donezepil. ? ?Essential hypertension: ?continue lisinopril and hydrochlorothiazide ? ?DVT prophylaxis: lovenox ?Family Communication:none ?Status is: Inpatient ?Remains inpatient appropriate because: Acute respiratory failure with hypoxia secondary to pneumonia ? ? ? ?Code Status:  ? ?  ?Code Status Orders  ?(From admission, onward)  ?  ? ? ?  ? ?  Start     Ordered  ? 05/22/21 2154  Full code  Continuous       ? 05/22/21 2154  ? ?  ?  ? ?  ? ?Code Status History   ? ? Date Active Date Inactive Code Status Order ID Comments User Context  ? 01/26/2019 0131 01/29/2019 1954 Full Code [id number]  Etta Quill, DO ED  ? 06/30/2011 1736 07/04/2011 1726 Full Code [id number]  Cyndra Numbers, RN Inpatient  ? ?  ? ?Advance Directive Documentation   ? ?Flowsheet Row Most Recent Value  ?Type of Advance Directive Healthcare Power of [place], Living will  ?Pre-existing out of facility DNR order (yellow form or pink MOST form) --  ?"MOST" Form in Place? --  ? ?  ? ? ? ? ?IV Access:  ? ?Peripheral IV ? ? ?Procedures and diagnostic studies:  ? ?CT Angio Chest PE W and/or Wo  Contrast ? ?Result Date: 05/22/2021 ?CLINICAL DATA:  High probability for pulmonary embolism. Cough with hypoxemia EXAM: CT ANGIOGRAPHY CHEST WITH CONTRAST TECHNIQUE: Multidetector CT imaging of the chest was performed using the standard protocol during bolus administration of intravenous contrast. Multiplanar CT image reconstructions and MIPs were obtained to evaluate the vascular anatomy. RADIATION DOSE REDUCTION: This exam was performed according to the departmental dose-optimization program which includes automated exposure control, adjustment of the mA and/or kV according to patient size and/or use of iterative reconstruction technique. CONTRAST:  39m OMNIPAQUE IOHEXOL 350 MG/ML SOLN COMPARISON:  None. FINDINGS: Cardiovascular: Satisfactory opacification of the pulmonary arteries to the segmental level. No evidence of pulmonary embolism. Normal heart size. No pericardial effusion. Atheromatous calcification of the aorta. Limited opacification of the aorta and systemic arterial tree. Mediastinum/Nodes: Negative for adenopathy or mass. Lungs/Pleura: Patchy nodular and consolidative opacity in the right lower lobe. Mild patchy atelectasis in the bilateral lungs. No edema or effusion. Upper Abdomen: No acute finding Musculoskeletal: Generalized osteopenia. Multilevel bridging osteophyte Review of the MIP images confirms the above findings. IMPRESSION: 1. Right lower lobe pneumonia. 2. Negative for pulmonary embolism. 3. Aortic Atherosclerosis (ICD10-I70.0). Electronically Signed   By: JJorje GuildM.D.   On: 05/22/2021 11:43  ? ?DG Chest Portable 1 View ? ?Result Date: 05/22/2021 ?CLINICAL DATA:  81year old female with cough. EXAM: PORTABLE CHEST - 1 VIEW COMPARISON:  01/25/2019 FINDINGS: The mediastinal contours are within normal limits.  No cardiomegaly. Atherosclerotic calcifications of the aortic arch. The lungs are clear bilaterally without evidence of focal consolidation, pleural effusion, or pneumothorax.  No acute osseous abnormality. IMPRESSION: 1. No acute cardiopulmonary process. 2.  Aortic Atherosclerosis (ICD10-I70.0). Electronically Signed   By: Ruthann Cancer M.D.   On: 05/22/2021 09:39   ? ? ?Medical Consultants:  ? ?None. ? ? ?Subjective:  ? ? ?Katie Carrillo cough has improved she relates she has a little bit more strength has no new complaints tolerating her diet. ? ?Objective:  ? ? ?Vitals:  ? 05/23/21 0913 05/23/21 1731 05/23/21 1958 05/24/21 0410  ?BP: (!) 141/75 (!) 149/75 (!) 145/77 (!) 147/80  ?Pulse: 65 (!) 49 (!) 58 65  ?Resp: '18  18 19  '$ ?Temp: 98.5 ?F (36.9 ?C) 98.1 ?F (36.7 ?C) 98.4 ?F (36.9 ?C) 97.8 ?F (36.6 ?C)  ?TempSrc: Oral Oral Oral Oral  ?SpO2:  98% 98% 97%  ?Weight:      ?Height:      ? ?SpO2: 97 % ?O2 Flow Rate (L/min): 2 L/min ? ? ?Intake/Output Summary (Last 24 hours) at 05/24/2021 0846 ?Last data filed at 05/23/2021 2123 ?Gross per 24 hour  ?Intake 2361.13 ml  ?Output 850 ml  ?Net 1511.13 ml  ? ? ?Filed Weights  ? 05/22/21 0908  ?Weight: 83 kg  ? ? ?Exam: ?General exam: In no acute distress. ?Respiratory system: Good air movement and clear to auscultation. ?Cardiovascular system: S1 & S2 heard, RRR. No JVD. ?Gastrointestinal system: Abdomen is nondistended, soft and nontender.  ?Extremities: No pedal edema. ?Skin: No rashes, lesions or ulcers ?Psychiatry: Judgement and insight appear normal. Mood & affect appropriate. ? ?Data Reviewed:  ? ? ?Labs: ?Basic Metabolic Panel: ?Recent Labs  ?Lab 05/22/21 ?0941  ?NA 139  ?K 3.7  ?CL 102  ?CO2 27  ?GLUCOSE 117*  ?BUN 12  ?CREATININE 0.82  ?CALCIUM 8.9  ? ? ?GFR ?Estimated Creatinine Clearance: 61.8 mL/min (by C-G formula based on SCr of 0.82 mg/dL). ?Liver Function Tests: ?Recent Labs  ?Lab 05/22/21 ?0941  ?AST 19  ?ALT 11  ?ALKPHOS 56  ?BILITOT 0.6  ?PROT 7.1  ?ALBUMIN 4.0  ? ? ?No results for input(s): LIPASE, AMYLASE in the last 168 hours. ?No results for input(s): AMMONIA in the last 168 hours. ?Coagulation profile ?No results for input(s):  INR, PROTIME in the last 168 hours. ?COVID-19 Labs ? ?No results for input(s): DDIMER, FERRITIN, LDH, CRP in the last 72 hours. ? ?Lab Results  ?Component Value Date  ? [place] NEGATIVE 05/22/2021  ? [place] NEGATIVE 01/26/2019  ? ? ?CBC: ?Recent Labs  ?Lab 05/22/21 ?8841 05/23/21 ?6606  ?WBC 4.7 4.5  ?NEUTROABS 3.4  --   ?HGB 13.3 12.3  ?HCT 41.2 38.1  ?MCV 92.8 94.3  ?PLT 170 151  ? ? ?Cardiac Enzymes: ?No results for input(s): CKTOTAL, CKMB, CKMBINDEX, TROPONINI in the last 168 hours. ?BNP (last 3 results) ?No results for input(s): PROBNP in the last 8760 hours. ?CBG: ?No results for input(s): GLUCAP in the last 168 hours. ?D-Dimer: ?No results for input(s): DDIMER in the last 72 hours. ?Hgb A1c: ?No results for input(s): HGBA1C in the last 72 hours. ?Lipid Profile: ?No results for input(s): CHOL, HDL, LDLCALC, TRIG, CHOLHDL, LDLDIRECT in the last 72 hours. ?Thyroid function studies: ?No results for input(s): TSH, T4TOTAL, T3FREE, THYROIDAB in the last 72 hours. ? ?Invalid input(s): FREET3 ?Anemia work up: ?No results for input(s): VITAMINB12, FOLATE, FERRITIN, TIBC, IRON, RETICCTPCT in the last 72 hours. ?Sepsis  Labs: ?Recent Labs  ?Lab 05/22/21 ?4076 05/23/21 ?8088  ?WBC 4.7 4.5  ? ? ?Microbiology ?Recent Results (from the past 240 hour(s))  ?Resp Panel by RT-PCR (Flu A&B, Covid) Nasopharyngeal Swab     Status: None  ? Collection Time: 05/22/21  9:37 AM  ? Specimen: Nasopharyngeal Swab; Nasopharyngeal(NP) swabs in vial transport medium  ?Result Value Ref Range Status  ? SARS Coronavirus 2 by RT PCR NEGATIVE NEGATIVE Final  ?  Comment: (NOTE) ?SARS-CoV-2 target nucleic acids are NOT DETECTED. ? ?The SARS-CoV-2 RNA is generally detectable in upper respiratory ?specimens during the acute phase of infection. The lowest ?concentration of SARS-CoV-2 viral copies this assay can detect is ?138 copies/mL. A negative result does not preclude SARS-Cov-2 ?infection and should not be used as the sole basis for  treatment or ?other patient management decisions. A negative result may occur with  ?improper specimen collection/handling, submission of specimen other ?than nasopharyngeal swab, presence of viral mutation

## 2021-05-24 NOTE — Evaluation (Signed)
Physical Therapy Evaluation ?Patient Details ?Name: Katie Carrillo ?MRN: [id number] ?DOB: [birth year] ?Today's Date: 05/24/2021 ? ?History of Present Illness ? Katie Carrillo is an 81 y.o. female past medical history significant Parkinson's disease, essential hypertension remote history of breast cancer and colon cancer presents for right lower lobe pneumonia  ?Clinical Impression ? Pt admitted with above diagnosis.  Pt currently with functional limitations due to the deficits listed below (see PT Problem List). Pt will benefit from skilled PT to increase their independence and safety with mobility to allow discharge to the venue listed below.  Recommend home with HHPT.  She has a RW already at home and pt instructed to use it at this time.  Also recommended her daughter/son-in-law come assist getting her into the house on the steps for safety. ?   ?   ? ?Recommendations for follow up therapy are one component of a multi-disciplinary discharge planning process, led by the attending physician.  Recommendations may be updated based on patient status, additional functional criteria and insurance authorization. ? ?Follow Up Recommendations Home health PT ? ?  ?Assistance Recommended at Discharge Intermittent Supervision/Assistance  ?Patient can return home with the following ? A little help with walking and/or transfers;Help with stairs or ramp for entrance;A little help with bathing/dressing/bathroom ? ?  ?Equipment Recommendations None recommended by PT  ?Recommendations for Other Services ?    ?  ?Functional Status Assessment Patient has had a recent decline in their functional status and demonstrates the ability to make significant improvements in function in a reasonable and predictable amount of time.  ? ?  ?Precautions / Restrictions Precautions ?Precautions: Fall ?Restrictions ?Weight Bearing Restrictions: No  ? ?  ? ?Mobility ? Bed Mobility ?  ?  ?  ?  ?  ?  ?  ?General bed mobility comments: up in recliner upon  arrival ?  ? ?Transfers ?Overall transfer level: Needs assistance ?  ?Transfers: Sit to/from Stand ?Sit to Stand: Supervision ?  ?  ?  ?  ?  ?General transfer comment: stood from recliner and toilet with S ?  ? ?Ambulation/Gait ?Ambulation/Gait assistance: Min guard ?Gait Distance (Feet): 150 Feet ?Assistive device: Rolling walker (2 wheels) ?Gait Pattern/deviations: Decreased step length - right, Decreased step length - left ?Gait velocity: decreased ?  ?  ?General Gait Details: When turning, R leg seemed to "drag" behind and needed to go slowly to keep safe BOS. ? ?Stairs ?  ?  ?  ?  ?  ? ?Wheelchair Mobility ?  ? ?Modified Rankin (Stroke Patients Only) ?  ? ?  ? ?Balance Overall balance assessment: Needs assistance ?  ?  ?  ?  ?  ?Standing balance-Leahy Scale: Fair ?  ?  ?  ?  ?  ?  ?  ?  ?  ?  ?  ?  ?   ? ? ? ?Pertinent Vitals/Pain Pain Assessment ?Pain Assessment: No/denies pain  ? ? ?Home Living Family/patient expects to be discharged to:: Private residence ?Living Arrangements: Spouse/significant other ?Available Help at Discharge: Family;Available PRN/intermittently ?Type of Home: House ?Home Access: Stairs to enter ?Entrance Stairs-Rails: Right ?Entrance Stairs-Number of Steps: 3 ?  ?Home Layout: One level ?Home Equipment: Rolling Walker (2 wheels);BSC/3in1 ?   ?  ?Prior Function Prior Level of Function : Independent/Modified Independent ?  ?  ?  ?  ?  ?  ?  ?  ?  ? ? ?Hand Dominance  ?   ? ?  ?Extremity/Trunk  Assessment  ? Upper Extremity Assessment ?Upper Extremity Assessment: Overall WFL for tasks assessed (tremor noted in R hand) ?  ? ?Lower Extremity Assessment ?Lower Extremity Assessment: Generalized weakness ?  ? ?Cervical / Trunk Assessment ?Cervical / Trunk Assessment: Kyphotic  ?Communication  ? Communication: No difficulties  ?Cognition Arousal/Alertness: Awake/alert ?Behavior During Therapy: [facility] for tasks assessed/performed ?Overall Cognitive Status: Within Functional Limits for tasks assessed ?   ?  ?  ?  ?  ?  ?  ?  ?  ?  ?  ?  ?  ?  ?  ?  ?  ?  ?  ? ?  ?General Comments   ? ?  ?Exercises    ? ?Assessment/Plan  ?  ?PT Assessment Patient needs continued PT services  ?PT Problem List Decreased strength;Decreased activity tolerance;Decreased mobility;Decreased balance ? ?   ?  ?PT Treatment Interventions DME instruction;Gait training;Stair training;Functional mobility training;Therapeutic activities;Therapeutic exercise;Balance training;Patient/family education   ? ?PT Goals (Current goals can be found in the Care Plan section)  ?Acute Rehab PT Goals ?Patient Stated Goal: go home and get therapy there so she won't have to use the RW ?PT Goal Formulation: With patient ?Time For Goal Achievement: 06/07/21 ?Potential to Achieve Goals: Good ? ?  ?Frequency Min 3X/week ?  ? ? ?Co-evaluation   ?  ?  ?  ?  ? ? ?  ?AM-PAC PT "6 Clicks" Mobility  ?Outcome Measure Help needed turning from your back to your side while in a flat bed without using bedrails?: A Little ?Help needed moving from lying on your back to sitting on the side of a flat bed without using bedrails?: A Little ?Help needed moving to and from a bed to a chair (including a wheelchair)?: A Little ?Help needed standing up from a chair using your arms (e.g., wheelchair or bedside chair)?: None ?Help needed to walk in hospital room?: A Little ?Help needed climbing 3-5 steps with a railing? : A Little ?6 Click Score: 19 ? ?  ?End of Session Equipment Utilized During Treatment: Gait belt ?Activity Tolerance: Patient tolerated treatment well ?Patient left: in chair;with call bell/phone within reach;with nursing/sitter in room ?Nurse Communication: Mobility status ?PT Visit Diagnosis: Muscle weakness (generalized) (M62.81) ?  ? ?Time: 1540-1600 ?PT Time Calculation (min) (ACUTE ONLY): 20 min ? ? ?Charges:   PT Evaluation ?$PT Eval Moderate Complexity: 1 Mod ?  ?  ?   ? ? ?[name], PT ? ?05/24/2021 ? ? ?[name] ?05/24/2021, 4:07 PM ? ?

## 2021-05-25 MED ORDER — GUAIFENESIN-DM 100-10 MG/5ML PO SYRP: 10.0000 mL | ORAL_SOLUTION | Freq: Once | ORAL | Status: DC

## 2021-05-25 MED ORDER — GUAIFENESIN ER 600 MG PO TB12
600.0000 mg | ORAL_TABLET | Freq: Two times a day (BID) | ORAL | Status: DC
  Administered 2021-05-25 – 2021-05-26 (×3): 600 mg via ORAL
  Filled 2021-05-25 (×3): qty 1

## 2021-05-25 NOTE — Progress Notes (Addendum)
PROGRESS NOTE Katie Carrillo  SWF:[id number] DOB: 1940/11/16 DOA: 05/22/2021 PCP: Daisy Floro, MD   Brief Narrative/Hospital Course: 81 y.o. female past medical history significant Parkinson's disease, essential hypertension remote history of breast cancer and colon cancer presents for right lower lobe pneumonia, in the ED was satting 90% on 2 L no leukocytosis, SARS-CoV-2 and influenza PCR were negative CT of the head showed no PE but showed right lower lobe infiltrate started empirically on antibiotics.  Patient clinically improving and afebrile, oxygen weaned off to room air, labs remained stable with CBC BMP. getting PT OT- has  advised home health PT OT upon discharge    Subjective: Examined this morning.  Still having cough mostly dry.  She had significant cough 2 nights ago overall she feels she is getting better.Walked with nursing staff and was exhausted, short of breath and was having cough.   Assessment and Plan: Principal Problem:   CAP (community acquired pneumonia) Active Problems:   Parkinson's disease (HCC)   Essential hypertension   Acute respiratory failure with hypoxia (HCC)   Right lower lobe CAP: Hypoxemic oxygen 90% placed on 2 L: Patient having ongoing cough but overall clinically improving, vitals stable, able to come off oxygen. antibiotics IV Rocephin> po omnicef and azithromycin.  Encourage ambulation PT OT incentive spirometry.  Continue antitussives.  Essential hypertension: Fairly controlled on lisinopril/HCTZ  Parkinson's disease : Stable continue home Sinemet and Namenda Donezepil Deconditioning/debility continue PT OT  DVT prophylaxis: enoxaparin (LOVENOX) injection 40 mg Start: 05/22/21 2230 Code Status:   Code Status: Full Code Family Communication: plan of care discussed with patient/husband at bedside. Patient status is: inpatient Level of care: Progressive  Remains inpatient because: Ongoing management of pneumonia deconditioning Patient  currently not stable  Dispo: The patient is from: Home            Anticipated disposition: Home with home health  Mobility Assessment (last 72 hours)     Mobility Assessment     Row Name 05/25/21 0803 05/24/21 2300 05/24/21 1604 05/23/21 2123 05/22/21 2021   Does patient have an order for bedrest or is patient medically unstable No - Continue assessment No - Continue assessment -- No - Continue assessment No - Continue assessment   What is the highest level of mobility based on the progressive mobility assessment? Level 5 (Walks with assist in room/hall) - Balance while stepping forward/back and can walk in room with assist - Complete Level 5 (Walks with assist in room/hall) - Balance while stepping forward/back and can walk in room with assist - Complete Level 5 (Walks with assist in room/hall) - Balance while stepping forward/back and can walk in room with assist - Complete Level 5 (Walks with assist in room/hall) - Balance while stepping forward/back and can walk in room with assist - Complete Level 2 (Chairfast) - Balance while sitting on edge of bed and cannot stand   Is the above level different from baseline mobility prior to current illness? Yes - Recommend PT order Yes - Recommend PT order -- Yes - Recommend PT order Yes - Recommend PT order             Objective: Vitals last 24 hrs: Vitals:   05/24/21 1601 05/24/21 2152 05/25/21 0558 05/25/21 1145  BP:  (!) 152/72 (!) 152/84 (!) 166/83  Pulse:  (!) 54 (!) 59 (!) 57  Resp:  18 18 20   Temp:  98.3 F (36.8 C) 98.1 F (36.7 C) 97.8 F (36.6 C)  TempSrc:   Oral Oral  SpO2: 95% 92% 94% 93%  Weight:      Height:       Weight change:   Physical Examination: General exam: AA on x-ray, coughing with deep breath,older than stated age, weak appearing. HEENT:Oral mucosa moist, Ear/Nose WNL grossly, dentition normal. Respiratory system: bilaterally diminished BS, no use of accessory muscle Cardiovascular system: S1 & S2 +, No  JVD,. Gastrointestinal system: Abdomen soft,NT,ND, BS+ Nervous System:Alert, awake, moving extremities and grossly nonfocal Extremities: LE edema none,distal peripheral pulses palpable.  Skin: No rashes,no icterus. MSK: Normal muscle bulk,tone, power  Medications reviewed:  Scheduled Meds:  azithromycin  500 mg Oral Daily   carbidopa-levodopa  1 tablet Oral TID   cefdinir  300 mg Oral Q12H   donepezil  23 mg Oral QHS   enoxaparin (LOVENOX) injection  40 mg Subcutaneous Q24H   guaiFENesin  600 mg Oral BID   lisinopril  10 mg Oral Daily   And   hydrochlorothiazide  12.5 mg Oral Daily   memantine  10 mg Oral BID   omega-3 acid ethyl esters  1 g Oral Daily   polyethylene glycol  17 g Oral Daily   propranolol  10 mg Oral BID   Continuous Infusions:    Diet Order             Diet Heart Room service appropriate? Yes; Fluid consistency: Thin  Diet effective now                   Intake/Output Summary (Last 24 hours) at 05/25/2021 1330 Last data filed at 05/25/2021 0500 Gross per 24 hour  Intake 390 ml  Output 1400 ml  Net -1010 ml   Net IO Since Admission: 158.56 mL [05/25/21 1330]  Wt Readings from Last 3 Encounters:  05/22/21 83 kg  05/21/20 87.7 kg  04/06/19 86.2 kg     Unresulted Labs (From admission, onward)    None     Data Reviewed: I have personally reviewed following labs and imaging studies CBC: Recent Labs  Lab 05/22/21 0941 05/23/21 0349  WBC 4.7 4.5  NEUTROABS 3.4  --   HGB 13.3 12.3  HCT 41.2 38.1  MCV 92.8 94.3  PLT 170 151   Basic Metabolic Panel: Recent Labs  Lab 05/22/21 0941  NA 139  K 3.7  CL 102  CO2 27  GLUCOSE 117*  BUN 12  CREATININE 0.82  CALCIUM 8.9   GFR: Estimated Creatinine Clearance: 61.8 mL/min (by C-G formula based on SCr of 0.82 mg/dL). Liver Function Tests: Recent Labs  Lab 05/22/21 0941  AST 19  ALT 11  ALKPHOS 56  BILITOT 0.6  PROT 7.1  ALBUMIN 4.0   No results for input(s): LIPASE, AMYLASE in  the last 168 hours. No results for input(s): AMMONIA in the last 168 hours. Coagulation Profile: No results for input(s): INR, PROTIME in the last 168 hours. BNP (last 3 results) No results for input(s): PROBNP in the last 8760 hours. HbA1C: No results for input(s): HGBA1C in the last 72 hours. CBG: No results for input(s): GLUCAP in the last 168 hours. Lipid Profile: No results for input(s): CHOL, HDL, LDLCALC, TRIG, CHOLHDL, LDLDIRECT in the last 72 hours. Thyroid Function Tests: No results for input(s): TSH, T4TOTAL, FREET4, T3FREE, THYROIDAB in the last 72 hours. Sepsis Labs: No results for input(s): PROCALCITON, LATICACIDVEN in the last 168 hours.  Recent Results (from the past 240 hour(s))  Resp Panel by RT-PCR (Flu A&B,  Covid) Nasopharyngeal Swab     Status: None   Collection Time: 05/22/21  9:37 AM   Specimen: Nasopharyngeal Swab; Nasopharyngeal(NP) swabs in vial transport medium  Result Value Ref Range Status   SARS Coronavirus 2 by RT PCR NEGATIVE NEGATIVE Final    Comment: (NOTE) SARS-CoV-2 target nucleic acids are NOT DETECTED.  The SARS-CoV-2 RNA is generally detectable in upper respiratory specimens during the acute phase of infection. The lowest concentration of SARS-CoV-2 viral copies this assay can detect is 138 copies/mL. A negative result does not preclude SARS-Cov-2 infection and should not be used as the sole basis for treatment or other patient management decisions. A negative result may occur with  improper specimen collection/handling, submission of specimen other than nasopharyngeal swab, presence of viral mutation(s) within the areas targeted by this assay, and inadequate number of viral copies(<138 copies/mL). A negative result must be combined with clinical observations, patient history, and epidemiological information. The expected result is Negative.  Fact Sheet for Patients:  BloggerCourse.com  Fact Sheet for Healthcare  Providers:  SeriousBroker.it  This test is no t yet approved or cleared by the [place] FDA and  has been authorized for detection and/or diagnosis of SARS-CoV-2 by FDA under an Emergency Use Authorization (EUA). This EUA will remain  in effect (meaning this test can be used) for the duration of the COVID-19 declaration under Section 564(b)(1) of the Act, 21 U.S.C.section 360bbb-3(b)(1), unless the authorization is terminated  or revoked sooner.       Influenza A by PCR NEGATIVE NEGATIVE Final   Influenza B by PCR NEGATIVE NEGATIVE Final    Comment: (NOTE) The Xpert Xpress SARS-CoV-2/FLU/RSV plus assay is intended as an aid in the diagnosis of influenza from Nasopharyngeal swab specimens and should not be used as a sole basis for treatment. Nasal washings and aspirates are unacceptable for Xpert Xpress SARS-CoV-2/FLU/RSV testing.  Fact Sheet for Patients: BloggerCourse.com  Fact Sheet for Healthcare Providers: SeriousBroker.it  This test is not yet approved or cleared by the [place] FDA and has been authorized for detection and/or diagnosis of SARS-CoV-2 by FDA under an Emergency Use Authorization (EUA). This EUA will remain in effect (meaning this test can be used) for the duration of the COVID-19 declaration under Section 564(b)(1) of the Act, 21 U.S.C. section 360bbb-3(b)(1), unless the authorization is terminated or revoked.  Performed at [facility], [address] [place], [place] [address]     Antimicrobials: Anti-infectives (From admission, onward)    Start     Dose/Rate Route Frequency Ordered Stop   05/24/21 1000  cefdinir (OMNICEF) capsule 300 mg        300 mg Oral Every 12 hours 05/24/21 0842     05/24/21 1000  azithromycin (ZITHROMAX) tablet 500 mg        500 mg Oral Daily 05/24/21 0842     05/23/21 1000  cefTRIAXone (ROCEPHIN) 2 g in sodium chloride 0.9 % 100  mL IVPB  Status:  Discontinued        2 g 200 mL/hr over 30 Minutes Intravenous Every 24 hours 05/22/21 2154 05/24/21 0842   05/23/21 1000  azithromycin (ZITHROMAX) 500 mg in sodium chloride 0.9 % 250 mL IVPB  Status:  Discontinued        500 mg 250 mL/hr over 60 Minutes Intravenous Every 24 hours 05/22/21 2154 05/24/21 0842   05/22/21 1200  cefTRIAXone (ROCEPHIN) 1 g in sodium chloride 0.9 % 100 mL IVPB  1 g 200 mL/hr over 30 Minutes Intravenous  Once 05/22/21 1151 05/22/21 1306   05/22/21 1200  azithromycin (ZITHROMAX) 500 mg in sodium chloride 0.9 % 250 mL IVPB        500 mg 250 mL/hr over 60 Minutes Intravenous  Once 05/22/21 1151 05/22/21 1433      Culture/Microbiology    Component Value Date/Time   SDES URINE, CLEAN CATCH 01/26/2019 0600   SPECREQUEST NONE 01/26/2019 0600   CULT (A) 01/26/2019 0600    <10,000 COLONIES/mL INSIGNIFICANT GROWTH Performed at [facility], [address], [place], [place] [address]    REPTSTATUS 01/27/2019 FINAL 01/26/2019 0600    Radiology Studies: No results found.   LOS: 3 days   Lanae Boast, MD [facility]  05/25/2021, 1:30 PM

## 2021-05-25 NOTE — Plan of Care (Signed)
  Problem: Activity: Goal: Risk for activity intolerance will decrease Outcome: Progressing   Problem: Nutrition: Goal: Adequate nutrition will be maintained Outcome: Progressing   Problem: Coping: Goal: Level of anxiety will decrease Outcome: Progressing   

## 2021-05-25 NOTE — Hospital Course (Addendum)
81 y.o. female past medical history significant Parkinson's disease, essential hypertension remote history of breast cancer and colon cancer presents for right lower lobe pneumonia, in the ED was satting 90% on 2 L no leukocytosis, SARS-CoV-2 and influenza PCR were negative CT of the head showed no PE but showed right lower lobe infiltrate started empirically on antibiotics.  Patient clinically improving and afebrile, oxygen weaned off to room air, labs remained stable with CBC BMP. getting PT OT- has  advised home health PT OT upon discharge.  Patient kept for additional days due to exertional dyspnea and cough with activity.  Overall clinically improved and did very well with walking and being discharged home today ?

## 2021-05-26 MED ORDER — GUAIFENESIN-DM 100-10 MG/5ML PO SYRP: 10.0000 mL | ORAL_SOLUTION | ORAL | 0 refills | Status: AC | PRN

## 2021-05-26 MED ORDER — GUAIFENESIN ER 600 MG PO TB12: 600.0000 mg | ORAL_TABLET | Freq: Two times a day (BID) | ORAL | 0 refills | Status: AC

## 2021-05-26 MED ORDER — AZITHROMYCIN 500 MG PO TABS: 500.0000 mg | ORAL_TABLET | Freq: Every day | ORAL | 0 refills | Status: AC

## 2021-05-26 MED ORDER — CEFDINIR 300 MG PO CAPS
300.0000 mg | ORAL_CAPSULE | Freq: Two times a day (BID) | ORAL | 0 refills | Status: AC
Start: 2021-05-26 — End: 2021-05-30

## 2021-05-26 NOTE — Plan of Care (Signed)
  Problem: Activity: Goal: Risk for activity intolerance will decrease Outcome: Progressing   Problem: Nutrition: Goal: Adequate nutrition will be maintained Outcome: Progressing   Problem: Elimination: Goal: Will not experience complications related to urinary retention Outcome: Progressing   

## 2021-05-26 NOTE — Progress Notes (Signed)
Provided and discussed discharge instructions. Addressed all questions and concerns. Iv removed intact. Michie Molnar N Latorsha Curling  

## 2021-05-26 NOTE — TOC Progression Note (Signed)
Transition of Care (TOC) - Progression Note  ? ? ?Patient Details  ?Name: Katie Carrillo ?MRN: [id number] ?Date of Birth: Oct 29, 1940 ? ?Transition of Care (TOC) CM/SW Contact  ?Purcell Mouton, RN ?Phone Number: ?05/26/2021, 11:31 AM ? ?Clinical Narrative:    ?Spoke with pt concerning HHPT, [facility] was [facility]. Referral given to in house rep.  ? ? ?Expected Discharge Plan: Home/Self Care ?Barriers to Discharge: Continued Medical Work up ? ?Expected Discharge Plan and Services ?Expected Discharge Plan: Home/Self Care ?  ?  ?  ?Living arrangements for the past 2 months: [place] ?Expected Discharge Date: 05/26/21               ?  ?  ?  ?  ?  ?  ?  ?  ?  ?  ? ? ?Social Determinants of Health (SDOH) Interventions ?  ? ?Readmission Risk Interventions ?   ? View : No data to display.  ?  ?  ?  ? ? ?

## 2021-05-26 NOTE — Discharge Summary (Signed)
Physician Discharge Summary  ?Katie Carrillo ZGY:[id number] DOB: 1940/10/23 DOA: 05/22/2021 ? ?PCP: Lawerance Cruel, MD ? ?Admit date: 05/22/2021 ?Discharge date: 05/26/2021 ?Recommendations for Outpatient Follow-up:  ?Follow up with PCP in 1 weeks-call for appointment ?Please obtain BMP/CBC in one week ? ?Discharge Dispo: home w/ HH ?Discharge Condition: Stable ?Code Status:   Code Status: Full Code ?Diet recommendation:  ?Diet Order   ? ?       ?  Diet Heart Room service appropriate? Yes; Fluid consistency: Thin  Diet effective now       ?  ? ?  ?  ? ?  ?  ? ?Brief/Interim Summary: ?81 y.o. female past medical history significant Parkinson's disease, essential hypertension remote history of breast cancer and colon cancer presents for right lower lobe pneumonia, in the ED was satting 90% on 2 L no leukocytosis, SARS-CoV-2 and influenza PCR were negative CT of the head showed no PE but showed right lower lobe infiltrate started empirically on antibiotics.  Patient clinically improving and afebrile, oxygen weaned off to room air, labs remained stable with CBC BMP. getting PT OT- has  advised home health PT OT upon discharge.  Patient kept for additional days due to exertional dyspnea and cough with activity.  Overall clinically improved and did very well with walking and being discharged home today  ? ?Discharge Diagnoses:  ?Principal Problem: ?  CAP (community acquired pneumonia) ?Active Problems: ?  Parkinson's disease ([place]) ?  Essential hypertension ?  Acute respiratory failure with hypoxia ([place]) ? ?Right lower lobe CAP: ?Hypoxemic oxygen 90% placed on 2 L: ?Patient having ongoing cough  some but much improved vitals stable, able to come off oxygen.complete po abx for pneumonia, antitussives , did well with walking and ready for home today husband and pt agreeable ?  ?Essential hypertension: Fairly controlled on lisinopril/HCTZ ?Parkinson's disease : Stable continue home Sinemet and Namenda  Donezepil ?Deconditioning/debility continue PT OT HH ? ?Consults: ?none ?Subjective: ?Did well with walking feels better today ?Wants to go home ? ?Discharge Exam: ?Vitals:  ? 05/26/21 0430 05/26/21 0957  ?BP: (!) 142/83 132/75  ?Pulse: 65 60  ?Resp: 17   ?Temp: 98.2 ?F (36.8 ?C)   ?SpO2: 94%   ? ?General: Pt is alert, awake, not in acute distress ?Cardiovascular: RRR, S1/S2 +, no rubs, no gallops ?Respiratory: CTA bilaterally, no wheezing, no rhonchi ?Abdominal: Soft, NT, ND, bowel sounds + ?Extremities: no edema, no cyanosis ? ?Discharge Instructions ? ?Discharge Instructions   ? ? Discharge instructions   Complete by: As directed ?  ? Please call call MD or return to ER for similar or worsening recurring problem that brought you to hospital or if any fever,nausea/vomiting,abdominal pain, uncontrolled pain, chest pain,  shortness of breath or any other alarming symptoms. ? ?Please follow-up your doctor as instructed in a week time and call the office for appointment. ? ?Please avoid alcohol, smoking, or any other illicit substance and maintain healthy habits including taking your regular medications as prescribed. ? ?You were cared for by a hospitalist during your hospital stay. If you have any questions about your discharge medications or the care you received while you were in the hospital after you are discharged, you can call the unit and ask to speak with the hospitalist on call if the hospitalist that took care of you is not available. ? ?Once you are discharged, your primary care physician will handle any further medical issues. Please note that NO REFILLS for any  discharge medications will be authorized once you are discharged, as it is imperative that you return to your primary care physician (or establish a relationship with a primary care physician if you do not have one) for your aftercare needs so that they can reassess your need for medications and monitor your lab values  ? Increase activity slowly    Complete by: As directed ?  ? ?  ? ?Allergies as of 05/26/2021   ? ?   Reactions  ? Morphine And Related Other (See Comments)  ? Hallucinations   ? ?  ? ?  ?Medication List  ?  ? ?STOP taking these medications   ? ?ciprofloxacin 500 MG tablet ?Commonly known as: Cipro ?  ?HYDROcodone-acetaminophen 5-325 MG tablet ?Commonly known as: Norco ?  ?meloxicam 7.5 MG tablet ?Commonly known as: Mobic ?  ?metroNIDAZOLE 500 MG tablet ?Commonly known as: Flagyl ?  ?oxyCODONE-acetaminophen 5-325 MG tablet ?Commonly known as: PERCOCET/ROXICET ?  ?triamcinolone ointment 0.5 % ?Commonly known as: KENALOG ?  ? ?  ? ?TAKE these medications   ? ?acetaminophen 325 MG tablet ?Commonly known as: TYLENOL ?Take 325-650 mg by mouth every 8 (eight) hours as needed for mild pain or headache. ?  ?azithromycin 500 MG tablet ?Commonly known as: ZITHROMAX ?Take 1 tablet (500 mg total) by mouth daily for 1 day. ?  ?carbidopa-levodopa 25-100 MG tablet ?Commonly known as: SINEMET IR ?Take 1 tablet by mouth 3 (three) times daily. ?  ?cefdinir 300 MG capsule ?Commonly known as: OMNICEF ?Take 1 capsule (300 mg total) by mouth every 12 (twelve) hours for 4 days. ?  ?donepezil 23 MG Tabs tablet ?Commonly known as: ARICEPT ?Take 23 mg by mouth at bedtime. ?  ?Fish Oil 1200 MG Caps ?Take 1,200 mg by mouth every evening. ?  ?guaiFENesin 600 MG 12 hr tablet ?Commonly known as: [place] ?Take 1 tablet (600 mg total) by mouth 2 (two) times daily for 7 days. ?  ?guaiFENesin-dextromethorphan 100-10 MG/5ML syrup ?Commonly known as: ROBITUSSIN DM ?Take 10 mLs by mouth every 4 (four) hours as needed for up to 7 days for cough. ?  ?lidocaine 5 % ?Commonly known as: Lidoderm ?Place 1 patch onto the skin daily. Apply to painful area for 12 hours each day or night as needed. ?What changed:  ?when to take this ?reasons to take this ?additional instructions ?  ?lisinopril-hydrochlorothiazide 10-12.5 MG tablet ?Commonly known as: ZESTORETIC ?Take 0.5 tablets by mouth  daily. ?  ?memantine 10 MG tablet ?Commonly known as: NAMENDA ?Take 10 mg by mouth 2 (two) times daily. ?  ?multivitamin with minerals Tabs tablet ?Take 1 tablet by mouth daily. ?  ?oxybutynin 10 MG 24 hr tablet ?Commonly known as: Ditropan XL ?Take 1 tablet (10 mg total) by mouth at bedtime. ?  ?polyethylene glycol 17 g packet ?Commonly known as: MIRALAX / GLYCOLAX ?Take 17 g by mouth daily. ?  ?propranolol 10 MG tablet ?Commonly known as: INDERAL ?Take 10 mg by mouth 2 (two) times daily. ?  ?Red Yeast Rice 600 MG Caps ?Take 1,200 mg by mouth every evening. ?  ?traMADol 50 MG tablet ?Commonly known as: ULTRAM ?Take 1 tablet (50 mg total) by mouth every 12 (twelve) hours as needed for moderate pain. ?  ? ?  ? ? Follow-up Information   ? ? Lawerance Cruel, MD Follow up in 1 week(s).   ?Specialty: Family Medicine ?Contact information: ?[address] ?[place] [address] ?[phone] ? ? ?  ?  ? ?  ?  ? ?  ? ?  Allergies  ?Allergen Reactions  ? Morphine And Related Other (See Comments)  ?  Hallucinations   ? ? ?The results of significant diagnostics from this hospitalization (including imaging, microbiology, ancillary and laboratory) are listed below for reference.   ? ?Microbiology: ?Recent Results (from the past 240 hour(s))  ?Resp Panel by RT-PCR (Flu A&B, Covid) Nasopharyngeal Swab     Status: None  ? Collection Time: 05/22/21  9:37 AM  ? Specimen: Nasopharyngeal Swab; Nasopharyngeal(NP) swabs in vial transport medium  ?Result Value Ref Range Status  ? SARS Coronavirus 2 by RT PCR NEGATIVE NEGATIVE Final  ?  Comment: (NOTE) ?SARS-CoV-2 target nucleic acids are NOT DETECTED. ? ?The SARS-CoV-2 RNA is generally detectable in upper respiratory ?specimens during the acute phase of infection. The lowest ?concentration of SARS-CoV-2 viral copies this assay can detect is ?138 copies/mL. A negative result does not preclude SARS-Cov-2 ?infection and should not be used as the sole basis for treatment or ?other  patient management decisions. A negative result may occur with  ?improper specimen collection/handling, submission of specimen other ?than nasopharyngeal swab, presence of viral mutation(s) within the ?areas targeted by this assay, and

## 2021-06-01 DIAGNOSIS — Z79891 Long term (current) use of opiate analgesic: Secondary | ICD-10-CM | POA: Diagnosis not present

## 2021-06-01 DIAGNOSIS — Z85038 Personal history of other malignant neoplasm of large intestine: Secondary | ICD-10-CM | POA: Diagnosis not present

## 2021-06-01 DIAGNOSIS — J9601 Acute respiratory failure with hypoxia: Secondary | ICD-10-CM | POA: Diagnosis not present

## 2021-06-01 DIAGNOSIS — Z9181 History of falling: Secondary | ICD-10-CM | POA: Diagnosis not present

## 2021-06-01 DIAGNOSIS — G20 Parkinson's disease: Secondary | ICD-10-CM | POA: Diagnosis not present

## 2021-06-01 DIAGNOSIS — Z853 Personal history of malignant neoplasm of breast: Secondary | ICD-10-CM | POA: Diagnosis not present

## 2021-06-01 DIAGNOSIS — I10 Essential (primary) hypertension: Secondary | ICD-10-CM | POA: Diagnosis not present

## 2021-06-02 DIAGNOSIS — J9601 Acute respiratory failure with hypoxia: Secondary | ICD-10-CM | POA: Diagnosis not present

## 2021-06-02 DIAGNOSIS — Z9181 History of falling: Secondary | ICD-10-CM | POA: Diagnosis not present

## 2021-06-02 DIAGNOSIS — Z853 Personal history of malignant neoplasm of breast: Secondary | ICD-10-CM | POA: Diagnosis not present

## 2021-06-02 DIAGNOSIS — G20 Parkinson's disease: Secondary | ICD-10-CM | POA: Diagnosis not present

## 2021-06-02 DIAGNOSIS — Z85038 Personal history of other malignant neoplasm of large intestine: Secondary | ICD-10-CM | POA: Diagnosis not present

## 2021-06-02 DIAGNOSIS — Z79891 Long term (current) use of opiate analgesic: Secondary | ICD-10-CM | POA: Diagnosis not present

## 2021-06-02 DIAGNOSIS — I10 Essential (primary) hypertension: Secondary | ICD-10-CM | POA: Diagnosis not present

## 2021-06-04 DIAGNOSIS — I10 Essential (primary) hypertension: Secondary | ICD-10-CM | POA: Diagnosis not present

## 2021-06-04 DIAGNOSIS — G20 Parkinson's disease: Secondary | ICD-10-CM | POA: Diagnosis not present

## 2021-06-04 DIAGNOSIS — Z79891 Long term (current) use of opiate analgesic: Secondary | ICD-10-CM | POA: Diagnosis not present

## 2021-06-04 DIAGNOSIS — Z853 Personal history of malignant neoplasm of breast: Secondary | ICD-10-CM | POA: Diagnosis not present

## 2021-06-04 DIAGNOSIS — Z9181 History of falling: Secondary | ICD-10-CM | POA: Diagnosis not present

## 2021-06-04 DIAGNOSIS — J9601 Acute respiratory failure with hypoxia: Secondary | ICD-10-CM | POA: Diagnosis not present

## 2021-06-04 DIAGNOSIS — Z85038 Personal history of other malignant neoplasm of large intestine: Secondary | ICD-10-CM | POA: Diagnosis not present

## 2021-06-05 DIAGNOSIS — Z09 Encounter for follow-up examination after completed treatment for conditions other than malignant neoplasm: Secondary | ICD-10-CM | POA: Diagnosis not present

## 2021-06-09 DIAGNOSIS — Z853 Personal history of malignant neoplasm of breast: Secondary | ICD-10-CM | POA: Diagnosis not present

## 2021-06-09 DIAGNOSIS — J9601 Acute respiratory failure with hypoxia: Secondary | ICD-10-CM | POA: Diagnosis not present

## 2021-06-09 DIAGNOSIS — Z85038 Personal history of other malignant neoplasm of large intestine: Secondary | ICD-10-CM | POA: Diagnosis not present

## 2021-06-09 DIAGNOSIS — G20 Parkinson's disease: Secondary | ICD-10-CM | POA: Diagnosis not present

## 2021-06-09 DIAGNOSIS — Z79891 Long term (current) use of opiate analgesic: Secondary | ICD-10-CM | POA: Diagnosis not present

## 2021-06-09 DIAGNOSIS — I10 Essential (primary) hypertension: Secondary | ICD-10-CM | POA: Diagnosis not present

## 2021-06-09 DIAGNOSIS — Z9181 History of falling: Secondary | ICD-10-CM | POA: Diagnosis not present

## 2021-06-11 DIAGNOSIS — Z853 Personal history of malignant neoplasm of breast: Secondary | ICD-10-CM | POA: Diagnosis not present

## 2021-06-11 DIAGNOSIS — I10 Essential (primary) hypertension: Secondary | ICD-10-CM | POA: Diagnosis not present

## 2021-06-11 DIAGNOSIS — Z79891 Long term (current) use of opiate analgesic: Secondary | ICD-10-CM | POA: Diagnosis not present

## 2021-06-11 DIAGNOSIS — Z9181 History of falling: Secondary | ICD-10-CM | POA: Diagnosis not present

## 2021-06-11 DIAGNOSIS — Z85038 Personal history of other malignant neoplasm of large intestine: Secondary | ICD-10-CM | POA: Diagnosis not present

## 2021-06-11 DIAGNOSIS — J9601 Acute respiratory failure with hypoxia: Secondary | ICD-10-CM | POA: Diagnosis not present

## 2021-06-11 DIAGNOSIS — G20 Parkinson's disease: Secondary | ICD-10-CM | POA: Diagnosis not present

## 2021-06-12 DIAGNOSIS — J9601 Acute respiratory failure with hypoxia: Secondary | ICD-10-CM | POA: Diagnosis not present

## 2021-06-12 DIAGNOSIS — Z853 Personal history of malignant neoplasm of breast: Secondary | ICD-10-CM | POA: Diagnosis not present

## 2021-06-12 DIAGNOSIS — I10 Essential (primary) hypertension: Secondary | ICD-10-CM | POA: Diagnosis not present

## 2021-06-12 DIAGNOSIS — Z85038 Personal history of other malignant neoplasm of large intestine: Secondary | ICD-10-CM | POA: Diagnosis not present

## 2021-06-12 DIAGNOSIS — Z9181 History of falling: Secondary | ICD-10-CM | POA: Diagnosis not present

## 2021-06-12 DIAGNOSIS — G20 Parkinson's disease: Secondary | ICD-10-CM | POA: Diagnosis not present

## 2021-06-12 DIAGNOSIS — Z79891 Long term (current) use of opiate analgesic: Secondary | ICD-10-CM | POA: Diagnosis not present

## 2021-06-15 DIAGNOSIS — Z79891 Long term (current) use of opiate analgesic: Secondary | ICD-10-CM | POA: Diagnosis not present

## 2021-06-15 DIAGNOSIS — J9601 Acute respiratory failure with hypoxia: Secondary | ICD-10-CM | POA: Diagnosis not present

## 2021-06-15 DIAGNOSIS — I10 Essential (primary) hypertension: Secondary | ICD-10-CM | POA: Diagnosis not present

## 2021-06-15 DIAGNOSIS — Z85038 Personal history of other malignant neoplasm of large intestine: Secondary | ICD-10-CM | POA: Diagnosis not present

## 2021-06-15 DIAGNOSIS — Z9181 History of falling: Secondary | ICD-10-CM | POA: Diagnosis not present

## 2021-06-15 DIAGNOSIS — Z853 Personal history of malignant neoplasm of breast: Secondary | ICD-10-CM | POA: Diagnosis not present

## 2021-06-15 DIAGNOSIS — G20 Parkinson's disease: Secondary | ICD-10-CM | POA: Diagnosis not present

## 2021-06-16 ENCOUNTER — Ambulatory Visit (INDEPENDENT_AMBULATORY_CARE_PROVIDER_SITE_OTHER): Payer: Medicare Other | Admitting: Obstetrics & Gynecology

## 2021-06-16 ENCOUNTER — Encounter: Payer: Self-pay | Admitting: Obstetrics & Gynecology

## 2021-06-16 VITALS — BP 140/78 | HR 62 | Ht 68.0 in | Wt 186.2 lb

## 2021-06-16 DIAGNOSIS — L309 Dermatitis, unspecified: Secondary | ICD-10-CM | POA: Diagnosis not present

## 2021-06-16 DIAGNOSIS — N8110 Cystocele, unspecified: Secondary | ICD-10-CM | POA: Diagnosis not present

## 2021-06-16 MED ORDER — TRIAMCINOLONE ACETONIDE 0.5 % EX OINT: TOPICAL_OINTMENT | CUTANEOUS | 0 refills | Status: AC

## 2021-06-16 NOTE — Progress Notes (Signed)
? ?  WELL-WOMAN EXAMINATION ?Patient name: Katie Carrillo MRN [id number]  Date of birth: July 25, 1940 ?Chief Complaint:   ?Annual Exam ? ?History of Present Illness:   ?Katie Carrillo is a 81 y.o. G3P1021 PM. [place] female being seen today for yearly follow up regarding: ? ?-Vulvar dermatitis- Still using triamcinolone on occasion, which will improve her irritation.  Denies irritation currently. Denies vaginal spotting. ? ?She does note urinary frequency and goes to the bathroom regularly.  Wears Depends overnight.  States this is about the same. ? ?No LMP recorded. Patient has had a hysterectomy. ? ?Last pap n/a.  ?Last mammogram: 03/2021 neg. ? ? ?  06/16/2021  ? 11:40 AM 05/21/2020  ?  2:32 PM  ?Depression screen PHQ 2/9  ?Decreased Interest 0 0  ?Down, Depressed, Hopeless 0 0  ?PHQ - 2 Score 0 0  ?Altered sleeping 0 0  ?Tired, decreased energy 0 0  ?Change in appetite 0 0  ?Feeling bad or failure about yourself  0 0  ?Trouble concentrating 0 0  ?Moving slowly or fidgety/restless 0 0  ?Suicidal thoughts 0   ?PHQ-9 Score 0 0  ? ? ? ? ?Review of Systems:   ?Pertinent items are noted in HPI ?Denies any headaches, blurred vision, fatigue, shortness of breath, chest pain, abdominal pain, bowel movements, urination, or intercourse unless otherwise stated above. ? ?Pertinent History Reviewed:  ?Reviewed past medical,surgical, social and family history.  ?Reviewed problem list, medications and allergies. ?Physical Assessment:  ? ?Vitals:  ? 06/16/21 1130  ?BP: 140/78  ?Pulse: 62  ?Weight: 186 lb 3.2 oz (84.5 kg)  ?Height: '5\' 8"'$  (1.727 m)  ?Body mass index is 28.31 kg/m?. ?  ?     Physical Examination:  ? General appearance - well appearing, and in no distress ? Mental status - alert and oriented ? Psych:  She has a normal mood and affect ? Skin - warm and dry, normal color, no suspicious lesions noted ? Chest - effort normal, all lung fields clear to auscultation bilaterally ? Heart - normal rate and regular rhythm ? Neck:  midline  trachea, no thyromegaly or nodules ? Breasts - breasts appear normal, no suspicious masses, no skin or nipple changes or  axillary nodes ? Abdomen - soft, nontender, nondistended, no masses or organomegaly ? Pelvic - stage 2 prolapse visualized, VULVA: normal appearing vulva with no masses, tenderness or lesions  VAGINA: normal appearing vagina with normal color and discharge, no lesions, atrophich changes noted.  Uterus and cervix surgically absent ? Extremities:  1+ edema, no calf tenderness bilaterally ? ?Chaperone:  pt declined    ? ? ?Assessment & Plan:  ?1) Vulvar dermatitis ?-no acute abnormalities noted ?-continue Triamcinolone as needed ? ?2) Stage 2 prolapse ?-urinary symptoms tolerable, does not sound as though she requires vaginal splinting ?-briefly discussed pessary, which she did not feel like she needed at this time ?-may consider pessary next visit ? ?Meds:  ?Meds ordered this encounter  ?Medications  ? triamcinolone ointment (KENALOG) 0.5 %  ?  Sig: Pea-sized amount to affected area once daily as needed  ?  Dispense:  30 g  ?  Refill:  0  ? ? ?Follow-up: Return in about 1 year (around 06/17/2022) for Annual, with Dr. Nelda Marseille. ? ? ?Janyth Pupa, DO ?Attending [place] ?[facility] ? ? ?

## 2021-06-17 DIAGNOSIS — I10 Essential (primary) hypertension: Secondary | ICD-10-CM | POA: Diagnosis not present

## 2021-06-17 DIAGNOSIS — Z853 Personal history of malignant neoplasm of breast: Secondary | ICD-10-CM | POA: Diagnosis not present

## 2021-06-17 DIAGNOSIS — Z85038 Personal history of other malignant neoplasm of large intestine: Secondary | ICD-10-CM | POA: Diagnosis not present

## 2021-06-17 DIAGNOSIS — Z79891 Long term (current) use of opiate analgesic: Secondary | ICD-10-CM | POA: Diagnosis not present

## 2021-06-17 DIAGNOSIS — J9601 Acute respiratory failure with hypoxia: Secondary | ICD-10-CM | POA: Diagnosis not present

## 2021-06-17 DIAGNOSIS — G20 Parkinson's disease: Secondary | ICD-10-CM | POA: Diagnosis not present

## 2021-06-17 DIAGNOSIS — Z9181 History of falling: Secondary | ICD-10-CM | POA: Diagnosis not present

## 2021-07-08 ENCOUNTER — Ambulatory Visit
Admission: RE | Admit: 2021-07-08 | Discharge: 2021-07-08 | Disposition: A | Discharge status: Home / Self Care | Payer: Medicare Other | Source: Ambulatory Visit | Attending: Family Medicine | Admitting: Family Medicine

## 2021-07-08 ENCOUNTER — Other Ambulatory Visit: Payer: Self-pay | Admitting: Family Medicine

## 2021-07-08 DIAGNOSIS — J189 Pneumonia, unspecified organism: Secondary | ICD-10-CM

## 2021-07-08 DIAGNOSIS — J8410 Pulmonary fibrosis, unspecified: Secondary | ICD-10-CM | POA: Diagnosis not present

## 2021-08-18 DIAGNOSIS — Z961 Presence of intraocular lens: Secondary | ICD-10-CM | POA: Diagnosis not present

## 2021-08-18 DIAGNOSIS — H524 Presbyopia: Secondary | ICD-10-CM | POA: Diagnosis not present

## 2021-09-11 DIAGNOSIS — L820 Inflamed seborrheic keratosis: Secondary | ICD-10-CM | POA: Diagnosis not present

## 2021-09-11 DIAGNOSIS — L821 Other seborrheic keratosis: Secondary | ICD-10-CM | POA: Diagnosis not present

## 2021-09-11 DIAGNOSIS — L570 Actinic keratosis: Secondary | ICD-10-CM | POA: Diagnosis not present

## 2021-09-11 DIAGNOSIS — L720 Epidermal cyst: Secondary | ICD-10-CM | POA: Diagnosis not present

## 2021-12-10 DIAGNOSIS — E785 Hyperlipidemia, unspecified: Secondary | ICD-10-CM | POA: Diagnosis not present

## 2021-12-10 DIAGNOSIS — R7303 Prediabetes: Secondary | ICD-10-CM | POA: Diagnosis not present

## 2021-12-10 DIAGNOSIS — I10 Essential (primary) hypertension: Secondary | ICD-10-CM | POA: Diagnosis not present

## 2021-12-16 DIAGNOSIS — I10 Essential (primary) hypertension: Secondary | ICD-10-CM | POA: Diagnosis not present

## 2021-12-16 DIAGNOSIS — R7303 Prediabetes: Secondary | ICD-10-CM | POA: Diagnosis not present

## 2021-12-16 DIAGNOSIS — E785 Hyperlipidemia, unspecified: Secondary | ICD-10-CM | POA: Diagnosis not present

## 2021-12-16 DIAGNOSIS — G20A1 Parkinson's disease without dyskinesia, without mention of fluctuations: Secondary | ICD-10-CM | POA: Diagnosis not present

## 2021-12-16 DIAGNOSIS — Z0000 Encounter for general adult medical examination without abnormal findings: Secondary | ICD-10-CM | POA: Diagnosis not present

## 2021-12-16 DIAGNOSIS — Z23 Encounter for immunization: Secondary | ICD-10-CM | POA: Diagnosis not present

## 2022-02-02 ENCOUNTER — Other Ambulatory Visit: Payer: Self-pay | Admitting: Family Medicine

## 2022-02-02 DIAGNOSIS — Z1231 Encounter for screening mammogram for malignant neoplasm of breast: Secondary | ICD-10-CM

## 2022-03-17 DIAGNOSIS — L578 Other skin changes due to chronic exposure to nonionizing radiation: Secondary | ICD-10-CM | POA: Diagnosis not present

## 2022-03-17 DIAGNOSIS — L570 Actinic keratosis: Secondary | ICD-10-CM | POA: Diagnosis not present

## 2022-03-17 DIAGNOSIS — L821 Other seborrheic keratosis: Secondary | ICD-10-CM | POA: Diagnosis not present

## 2022-03-17 DIAGNOSIS — L820 Inflamed seborrheic keratosis: Secondary | ICD-10-CM | POA: Diagnosis not present

## 2022-03-17 DIAGNOSIS — D0462 Carcinoma in situ of skin of left upper limb, including shoulder: Secondary | ICD-10-CM | POA: Diagnosis not present

## 2022-03-17 DIAGNOSIS — D225 Melanocytic nevi of trunk: Secondary | ICD-10-CM | POA: Diagnosis not present

## 2022-03-17 DIAGNOSIS — D485 Neoplasm of uncertain behavior of skin: Secondary | ICD-10-CM | POA: Diagnosis not present

## 2022-03-29 ENCOUNTER — Ambulatory Visit
Admission: RE | Admit: 2022-03-29 | Discharge: 2022-03-29 | Disposition: A | Discharge status: Home / Self Care | Payer: Medicare Other | Source: Ambulatory Visit | Attending: Family Medicine | Admitting: Family Medicine

## 2022-03-29 DIAGNOSIS — Z1231 Encounter for screening mammogram for malignant neoplasm of breast: Secondary | ICD-10-CM | POA: Diagnosis not present

## 2022-04-02 DIAGNOSIS — K5900 Constipation, unspecified: Secondary | ICD-10-CM | POA: Diagnosis not present

## 2022-04-02 DIAGNOSIS — K5732 Diverticulitis of large intestine without perforation or abscess without bleeding: Secondary | ICD-10-CM | POA: Diagnosis not present

## 2022-04-05 ENCOUNTER — Emergency Department (HOSPITAL_BASED_OUTPATIENT_CLINIC_OR_DEPARTMENT_OTHER): Payer: Medicare Other

## 2022-04-05 ENCOUNTER — Encounter (HOSPITAL_BASED_OUTPATIENT_CLINIC_OR_DEPARTMENT_OTHER): Payer: Self-pay | Admitting: Urology

## 2022-04-05 ENCOUNTER — Other Ambulatory Visit: Payer: Self-pay

## 2022-04-05 ENCOUNTER — Emergency Department (HOSPITAL_BASED_OUTPATIENT_CLINIC_OR_DEPARTMENT_OTHER)
Admission: EM | Admit: 2022-04-05 | Discharge: 2022-04-05 | Disposition: A | Discharge status: Home / Self Care | Payer: Medicare Other | Attending: Emergency Medicine | Admitting: Emergency Medicine

## 2022-04-05 DIAGNOSIS — R9389 Abnormal findings on diagnostic imaging of other specified body structures: Secondary | ICD-10-CM

## 2022-04-05 DIAGNOSIS — Z79899 Other long term (current) drug therapy: Secondary | ICD-10-CM | POA: Diagnosis not present

## 2022-04-05 DIAGNOSIS — R1032 Left lower quadrant pain: Secondary | ICD-10-CM | POA: Diagnosis not present

## 2022-04-05 DIAGNOSIS — I10 Essential (primary) hypertension: Secondary | ICD-10-CM | POA: Insufficient documentation

## 2022-04-05 DIAGNOSIS — R001 Bradycardia, unspecified: Secondary | ICD-10-CM | POA: Diagnosis not present

## 2022-04-05 DIAGNOSIS — R739 Hyperglycemia, unspecified: Secondary | ICD-10-CM | POA: Insufficient documentation

## 2022-04-05 DIAGNOSIS — G20C Parkinsonism, unspecified: Secondary | ICD-10-CM | POA: Insufficient documentation

## 2022-04-05 DIAGNOSIS — N200 Calculus of kidney: Secondary | ICD-10-CM | POA: Diagnosis not present

## 2022-04-05 DIAGNOSIS — F0390 Unspecified dementia without behavioral disturbance: Secondary | ICD-10-CM | POA: Insufficient documentation

## 2022-04-05 DIAGNOSIS — R350 Frequency of micturition: Secondary | ICD-10-CM

## 2022-04-05 DIAGNOSIS — K5730 Diverticulosis of large intestine without perforation or abscess without bleeding: Secondary | ICD-10-CM | POA: Diagnosis not present

## 2022-04-05 LAB — URINALYSIS, ROUTINE W REFLEX MICROSCOPIC
Bilirubin Urine: NEGATIVE
Glucose, UA: NEGATIVE mg/dL
Hgb urine dipstick: NEGATIVE
Ketones, ur: NEGATIVE mg/dL
Leukocytes,Ua: NEGATIVE
Nitrite: NEGATIVE
Protein, ur: NEGATIVE mg/dL
Specific Gravity, Urine: <=1.005 (ref 1.005–1.030)
pH: 5.5 (ref 5.0–8.0)

## 2022-04-05 LAB — CBC WITH DIFFERENTIAL/PLATELET
Abs Immature Granulocytes: 0.03 10*3/uL (ref 0.00–0.07)
Basophils Absolute: 0 10*3/uL (ref 0.0–0.1)
Basophils Relative: 0 %
Eosinophils Absolute: 0 10*3/uL (ref 0.0–0.5)
Eosinophils Relative: 0 %
HCT: 39.7 % (ref 36.0–46.0)
Hemoglobin: 12.8 g/dL (ref 12.0–15.0)
Immature Granulocytes: 0 %
Lymphocytes Relative: 10 %
Lymphs Abs: 0.8 10*3/uL (ref 0.7–4.0)
MCH: 30 pg (ref 26.0–34.0)
MCHC: 32.2 g/dL (ref 30.0–36.0)
MCV: 93 fL (ref 80.0–100.0)
Monocytes Absolute: 0.2 10*3/uL (ref 0.1–1.0)
Monocytes Relative: 2 %
Neutro Abs: 7.5 10*3/uL (ref 1.7–7.7)
Neutrophils Relative %: 88 %
Platelets: 214 10*3/uL (ref 150–400)
RBC: 4.27 MIL/uL (ref 3.87–5.11)
RDW: 14.2 % (ref 11.5–15.5)
WBC: 8.6 10*3/uL (ref 4.0–10.5)
nRBC: 0 % (ref 0.0–0.2)

## 2022-04-05 LAB — COMPREHENSIVE METABOLIC PANEL
ALT: 15 U/L (ref 0–44)
AST: 27 U/L (ref 15–41)
Albumin: 4 g/dL (ref 3.5–5.0)
Alkaline Phosphatase: 52 U/L (ref 38–126)
Anion gap: 8 (ref 5–15)
BUN: 27 mg/dL — ABNORMAL HIGH (ref 8–23)
CO2: 26 mmol/L (ref 22–32)
Calcium: 8.8 mg/dL — ABNORMAL LOW (ref 8.9–10.3)
Chloride: 105 mmol/L (ref 98–111)
Creatinine, Ser: 0.8 mg/dL (ref 0.44–1.00)
GFR, Estimated: >60 mL/min (ref >60)
Glucose, Bld: 131 mg/dL — ABNORMAL HIGH (ref 70–99)
Potassium: 3.5 mmol/L (ref 3.5–5.1)
Sodium: 139 mmol/L (ref 135–145)
Total Bilirubin: 0.6 mg/dL (ref 0.3–1.2)
Total Protein: 7.2 g/dL (ref 6.5–8.1)

## 2022-04-05 MED ORDER — ACETAMINOPHEN 325 MG PO TABS
650.0000 mg | ORAL_TABLET | Freq: Once | ORAL | Status: AC
  Administered 2022-04-05: 650 mg via ORAL
  Filled 2022-04-05: qty 2

## 2022-04-05 MED ORDER — IOHEXOL 300 MG/ML  SOLN
100.0000 mL | Freq: Once | INTRAMUSCULAR | Status: AC | PRN
  Administered 2022-04-05: 100 mL via INTRAVENOUS

## 2022-04-05 NOTE — Discharge Instructions (Signed)
You were seen in the ER today for evaluation of your left lower quadrant pain as well as your urinary frequency.  Your labs were unremarkable.  Your imaging was grossly unchanged from previous.  Your imaging shows that you have some gas in your bladder which may be from a fistula.  Given that you have had surgery before, I do recommend following up with [facility].  I have included the information for them into the discharge paperwork.  Please call.  Additionally, given your increase in urinary frequency, I am including ration for a urologist for you to call to schedule appointment with.  Overall, please follow-up with your primary care doctor.  I advised you to stop using an increased amount of MiraLAX and Dulcolax, go back to what you were on previously.  This is likely what is causing your left lower quadrant pain as there is no stool burden to push out.  You can take Tylenol as needed for pain.  If you have any concerns, new or worsening symptoms, please return to the nearest emergency department for evaluation. Contact a doctor if: Your belly pain changes or gets worse. You are not hungry, or you lose weight without trying. You are having trouble pooping (constipated) or have watery poop (diarrhea) for more than 2-3 days. You have pain when you pee or poop. Your belly pain wakes you up at night. Your pain gets worse with meals, after eating, or with certain foods. You are vomiting and cannot keep anything down. You have a fever. You have blood in your pee. Get help right away if: Your pain does not go away as soon as your doctor says it should. You cannot stop vomiting. Your pain is only in areas of your belly, such as the right side or the left lower part of the belly. You have bloody or black poop, or poop that looks like tar. You have very bad pain, cramping, or bloating in your belly. You have signs of not having enough fluid or water in your body (dehydration), such as: Dark  pee, very little pee, or no pee. Cracked lips. Dry mouth. Sunken eyes. Sleepiness. Weakness. You have trouble breathing or chest pain.

## 2022-04-05 NOTE — ED Provider Notes (Signed)
[facility] Provider Note   CSN: [id number] Arrival date & time: 04/05/22  1420     History Chief Complaint  Patient presents with   Constipation    Katie Carrillo is a 82 y.o. female with history of dementia, Parkinson's, diverticulitis, hypertension presents the emergency room today for evaluation of left lower quadrant pain for the past week.  She also reports that she has been having liquid stools as well with some urinary frequency and urgency.  She reports that she went to urgent care because she thought this is something wrong with her kidneys given her urinary frequency and urgency.  They tested her urine which she reports was normal.  She reports that they did an x-ray that showed that she was really constipated.  They gave her some MiraLAX and took a laxative take an asked her to go to the ER if she did not improve in the next few days.  Patient reports that she still has not improved and still wanted to come here.  She reports that she has been having watery liquid stools that are not black or bloody in appearance.  She is still tolerating p.o. and does not have any nausea or vomiting.  No fevers.  She has no pain or blood in her urine.   Constipation Associated symptoms: abdominal pain and diarrhea   Associated symptoms: no back pain, no dysuria, no fever, no nausea and no vomiting        Home Medications Prior to Admission medications   Medication Sig Start Date End Date Taking? Authorizing Provider  acetaminophen (TYLENOL) 325 MG tablet Take 325-650 mg by mouth every 8 (eight) hours as needed for mild pain or headache.     [provider]  carbidopa-levodopa (SINEMET IR) 25-100 MG tablet Take 1 tablet by mouth 3 (three) times daily.    [provider]  donepezil (ARICEPT) 23 MG TABS tablet Take 23 mg by mouth at bedtime.    [provider]  lisinopril-hydrochlorothiazide (PRINZIDE,ZESTORETIC) 10-12.5  MG per tablet Take 0.5 tablets by mouth daily. 08/30/11   [provider]  memantine (NAMENDA) 10 MG tablet Take 10 mg by mouth 2 (two) times daily.    [provider]  Multiple Vitamin (MULITIVITAMIN WITH MINERALS) TABS Take 1 tablet by mouth daily.    [provider]  Omega-3 Fatty Acids (FISH OIL) 1200 MG CAPS Take 1,200 mg by mouth every evening.    [provider]  oxybutynin (DITROPAN XL) 10 MG 24 hr tablet Take 1 tablet (10 mg total) by mouth at bedtime. Patient not taking: Reported on 05/22/2021 09/16/20 10/16/20  Janyth Pupa, DO  polyethylene glycol (MIRALAX / GLYCOLAX) 17 g packet Take 17 g by mouth daily.    [provider]  propranolol (INDERAL) 10 MG tablet Take 10 mg by mouth 2 (two) times daily.    [provider]  Red Yeast Rice 600 MG CAPS Take 1,200 mg by mouth every evening.  Patient not taking: Reported on 06/16/2021    [provider]  traMADol (ULTRAM) 50 MG tablet Take 1 tablet (50 mg total) by mouth every 12 (twelve) hours as needed for moderate pain. 03/14/17   Tanna Furry, MD  triamcinolone ointment (KENALOG) 0.5 % Pea-sized amount to affected area once daily as needed 06/16/21   Janyth Pupa, DO      Allergies    Morphine and related    Review of Systems  Review of Systems  Constitutional:  Negative for chills and fever.  Respiratory:  Negative for shortness of breath.   Cardiovascular:  Negative for chest pain.  Gastrointestinal:  Positive for abdominal pain, constipation and diarrhea. Negative for anal bleeding, blood in stool, nausea and vomiting.  Genitourinary:  Positive for frequency and urgency. Negative for dysuria and hematuria.  Musculoskeletal:  Negative for back pain.    Physical Exam Updated Vital Signs BP (!) 152/63 (BP Location: Left Arm)   Pulse (!) 52   Temp 97.6 F (36.4 C) (Oral)   Resp 20   Ht '5\' 8"'$  (1.727 m)   Wt 84.5 kg   SpO2 95%   BMI 28.33 kg/m  Physical  Exam Vitals and nursing note reviewed.  Constitutional:      Appearance: Normal appearance.  HENT:     Head: Normocephalic and atraumatic.     Mouth/Throat:     Mouth: Mucous membranes are moist.  Eyes:     General: No scleral icterus. Cardiovascular:     Rate and Rhythm: Regular rhythm. Bradycardia present.  Pulmonary:     Effort: Pulmonary effort is normal. No respiratory distress.     Breath sounds: Normal breath sounds.  Abdominal:     General: Bowel sounds are normal. There is no distension.     Palpations: Abdomen is soft.     Tenderness: There is abdominal tenderness. There is no guarding or rebound.     Comments: Mild left lower quadrant tenderness palpation.  Abdomen is soft, nondistended.  No guarding or rebound noted.  Normal active bowel sounds noted.  Musculoskeletal:        General: No deformity.     Cervical back: Normal range of motion.  Skin:    General: Skin is warm and dry.  Neurological:     Mental Status: She is alert. Mental status is at baseline.     ED Results / Procedures / Treatments   Labs (all labs ordered are listed, but only abnormal results are displayed) Labs Reviewed  COMPREHENSIVE METABOLIC PANEL - Abnormal; Notable for the following components:      Result Value   Glucose, Bld 131 (*)    BUN 27 (*)    Calcium 8.8 (*)    All other components within normal limits  CBC WITH DIFFERENTIAL/PLATELET  URINALYSIS, ROUTINE W REFLEX MICROSCOPIC    EKG None  Radiology No results found.  Procedures Procedures   Medications Ordered in ED Medications - No data to display  ED Course/ Medical Decision Making/ A&P                            Medical Decision Making Amount and/or Complexity of Data Reviewed Labs: ordered. Radiology: ordered.   82 y.o. female presents to the ER for evaluation of constipation. Differential diagnosis includes but is not limited to small bowel obstruction, fecal impaction, constipation, diverticulitis,  colitis, UTI. Vital signs mildly elevated blood pressure 122/63, bradycardic, otherwise afebrile, satting well room air without increased work of breathing. Physical exam as noted above.   I am unable to see the patient's previous imaging.  Given her history of diverticulosis as well as diverticulitis and having pain in the left lower quadrant, will do scan to rule out any other infection.  Would like to also like to rule out a small bowel obstruction versus fecal impaction.  Labs and CT imaging ordered.  I independently reviewed and interpreted the patient's labs.  CBC without leukocytosis or anemia.  CMP shows mildly elevated glucose at 131.  Mildly elevated BUN at 27.  Mild decreased calcium at 8.8.  Otherwise, no electrolyte or LFT normality.  Urinalysis shows***.  CT imaging shows ***.  After consideration of the diagnostic results and the patients response to treatment, I feel that *** .     ***emergency department workup does not suggest an emergent condition requiring admission or immediate intervention beyond what has been performed at this time. The plan is: ***. The patient is safe for discharge and has been instructed to return immediately for worsening symptoms, change in symptoms or any other concerns.   ***We discussed the results of the labs/imaging. The plan is ***. We discussed strict return precautions and red flag symptoms. The patient verbalized their understanding and agrees to the plan. The patient is stable and being discharged home in good condition.  ***Portions of this report may have been transcribed using voice recognition software. Every effort was made to ensure accuracy; however, inadvertent computerized transcription errors may be present.   Final Clinical Impression(s) / ED Diagnoses Final diagnoses:  None    Rx / DC Orders ED Discharge Orders     None

## 2022-04-05 NOTE — ED Triage Notes (Signed)
Pt seen at [facility] for constipation 01-May-2022 and told she was impacted with stool on xr  Was having urinary frequency and UA was negative 2022-05-01 at [facility]  Has passed some small amounts of liquid stool  Started taking miralax and colase with no relief

## 2022-04-05 NOTE — ED Notes (Signed)
Assisted up to [facility]

## 2022-04-20 DIAGNOSIS — D0462 Carcinoma in situ of skin of left upper limb, including shoulder: Secondary | ICD-10-CM | POA: Diagnosis not present

## 2022-04-20 DIAGNOSIS — L820 Inflamed seborrheic keratosis: Secondary | ICD-10-CM | POA: Diagnosis not present

## 2022-04-27 DIAGNOSIS — L03114 Cellulitis of left upper limb: Secondary | ICD-10-CM | POA: Diagnosis not present

## 2022-06-17 DIAGNOSIS — G20A1 Parkinson's disease without dyskinesia, without mention of fluctuations: Secondary | ICD-10-CM | POA: Diagnosis not present

## 2022-06-17 DIAGNOSIS — R5383 Other fatigue: Secondary | ICD-10-CM | POA: Diagnosis not present

## 2022-06-17 DIAGNOSIS — R7303 Prediabetes: Secondary | ICD-10-CM | POA: Diagnosis not present

## 2022-08-02 DIAGNOSIS — L910 Hypertrophic scar: Secondary | ICD-10-CM | POA: Diagnosis not present

## 2022-08-17 ENCOUNTER — Ambulatory Visit: Payer: Medicare Other | Admitting: Obstetrics & Gynecology

## 2022-09-13 ENCOUNTER — Ambulatory Visit: Payer: Medicare Other | Admitting: Obstetrics & Gynecology

## 2022-09-21 IMAGING — DX DG CHEST 1V PORT
1 series · 1 of 1 positions shown · non-contrast
Comparison: 01/25/2019

CLINICAL DATA: 81-year-old female with cough.

EXAM:
PORTABLE CHEST - 1 VIEW

[chest ap]
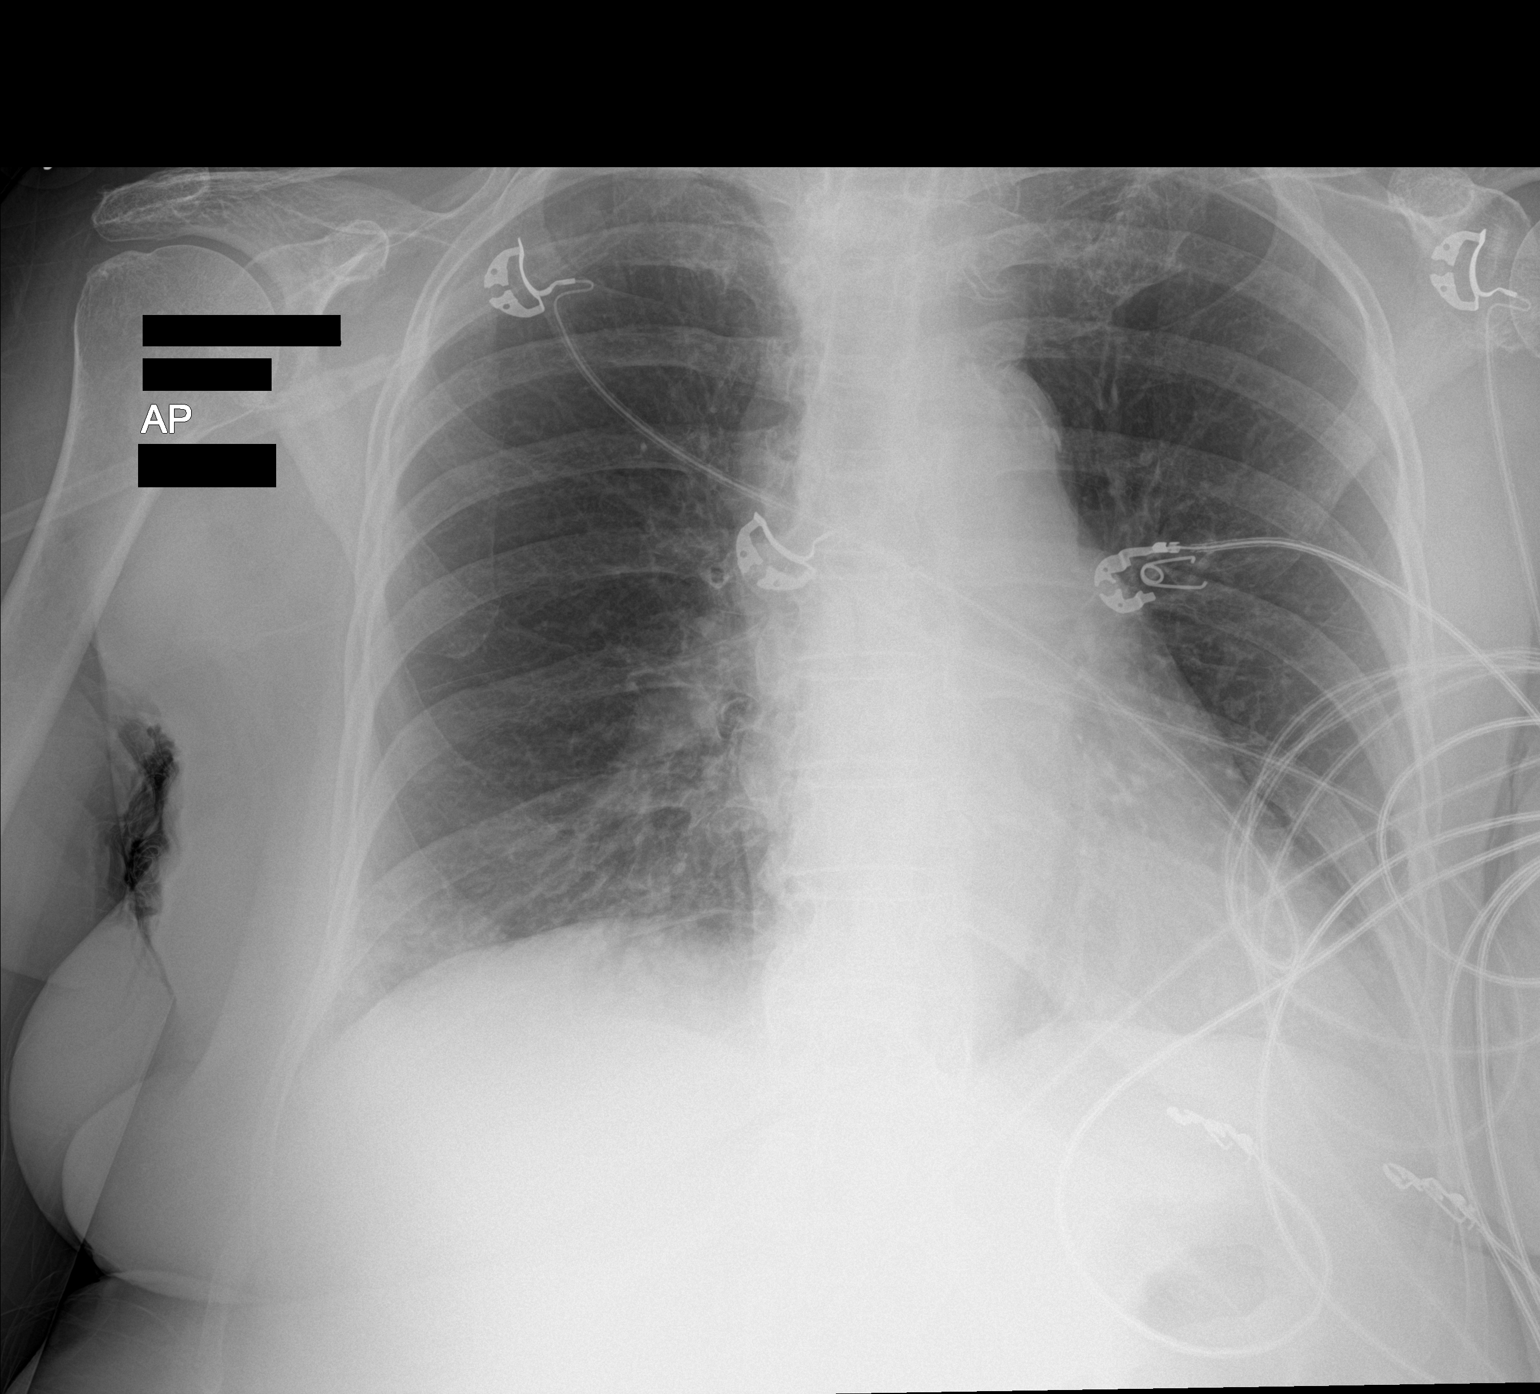

[1 of 1 positions shown; findings below may reference images not displayed]

FINDINGS: The mediastinal contours are within normal limits. No cardiomegaly.
Atherosclerotic calcifications of the aortic arch. The lungs are
clear bilaterally without evidence of focal consolidation, pleural
effusion, or pneumothorax. No acute osseous abnormality.
IMPRESSION: 1. No acute cardiopulmonary process.
2.  Aortic Atherosclerosis ([id number]).

## 2022-11-02 DIAGNOSIS — L570 Actinic keratosis: Secondary | ICD-10-CM | POA: Diagnosis not present

## 2022-11-02 DIAGNOSIS — L910 Hypertrophic scar: Secondary | ICD-10-CM | POA: Diagnosis not present

## 2022-11-02 DIAGNOSIS — D225 Melanocytic nevi of trunk: Secondary | ICD-10-CM | POA: Diagnosis not present

## 2022-11-02 DIAGNOSIS — L821 Other seborrheic keratosis: Secondary | ICD-10-CM | POA: Diagnosis not present

## 2022-11-02 DIAGNOSIS — L578 Other skin changes due to chronic exposure to nonionizing radiation: Secondary | ICD-10-CM | POA: Diagnosis not present

## 2022-11-07 IMAGING — CR DG CHEST 2V
2 series · 2 of 2 positions shown · non-contrast
Comparison: May 22, 2021

CLINICAL DATA: Pneumonia

EXAM:
CHEST - 2 VIEW

[w chest pa]
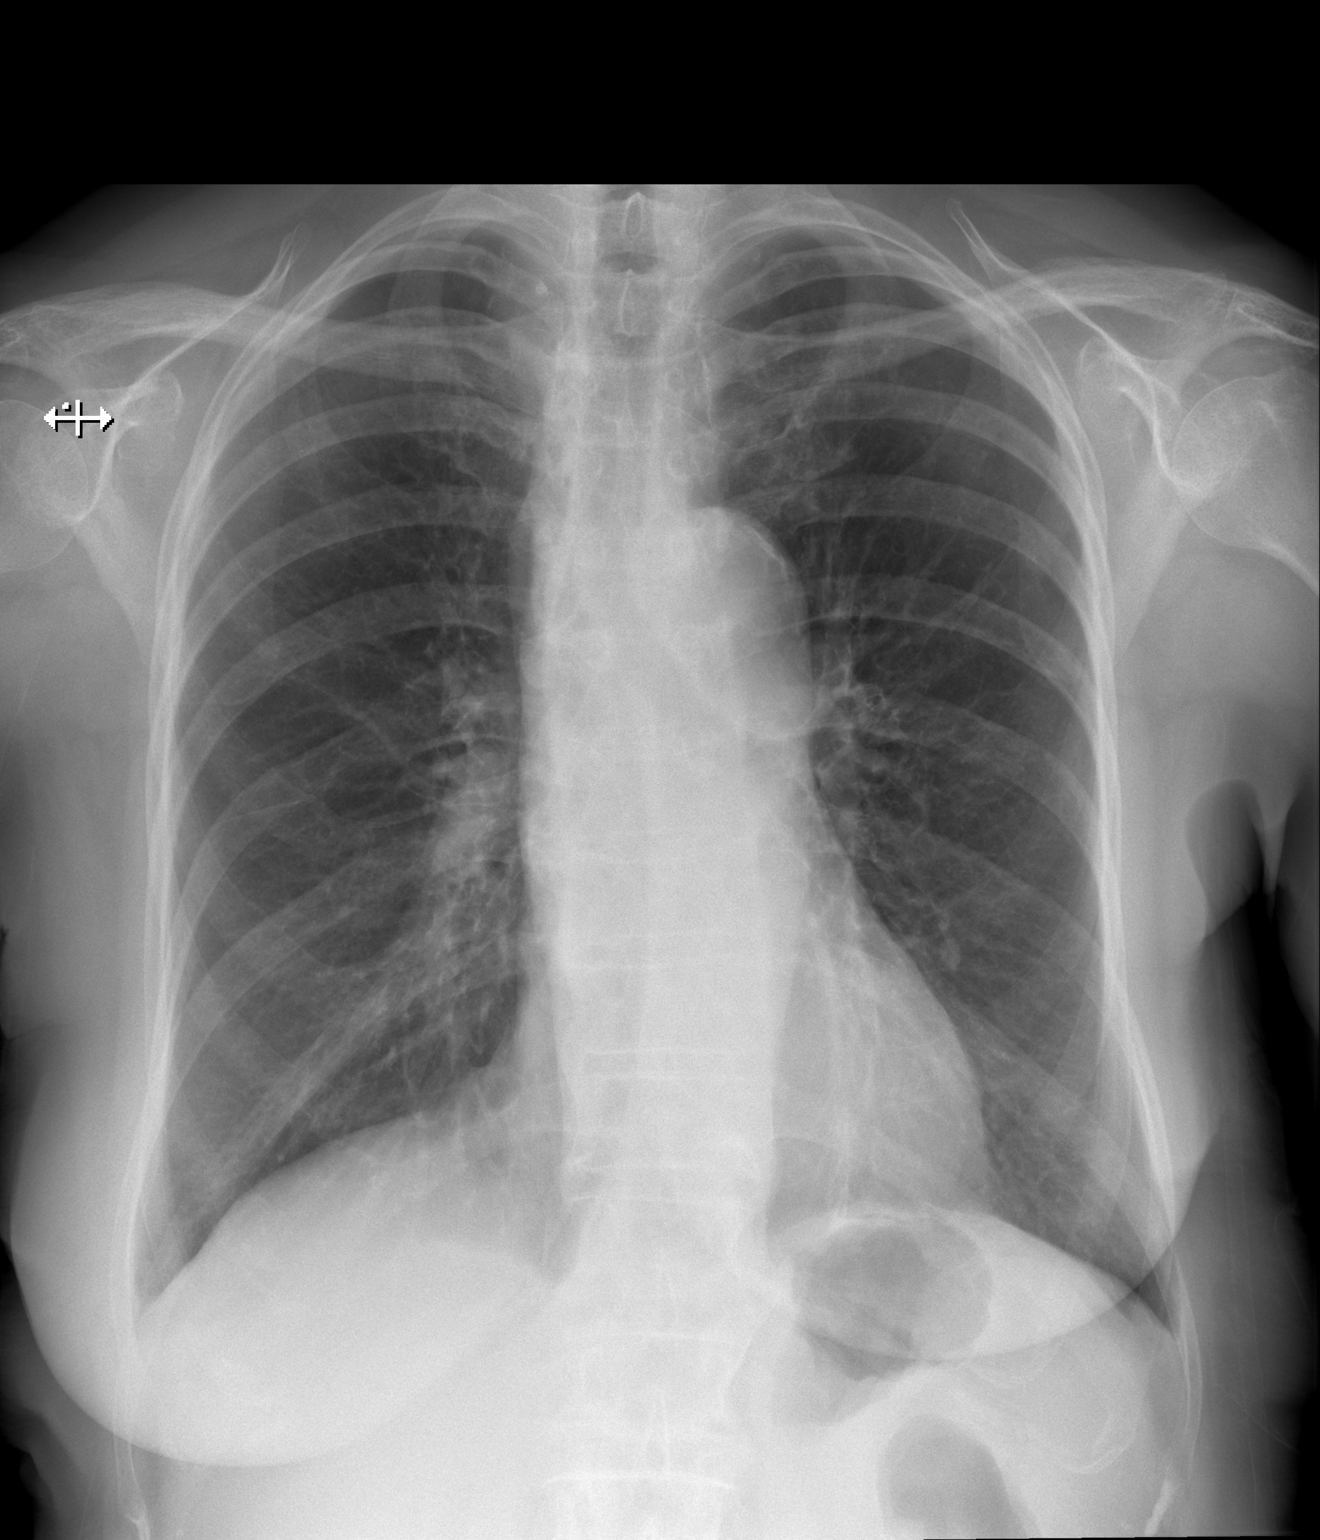

[w chest lat]
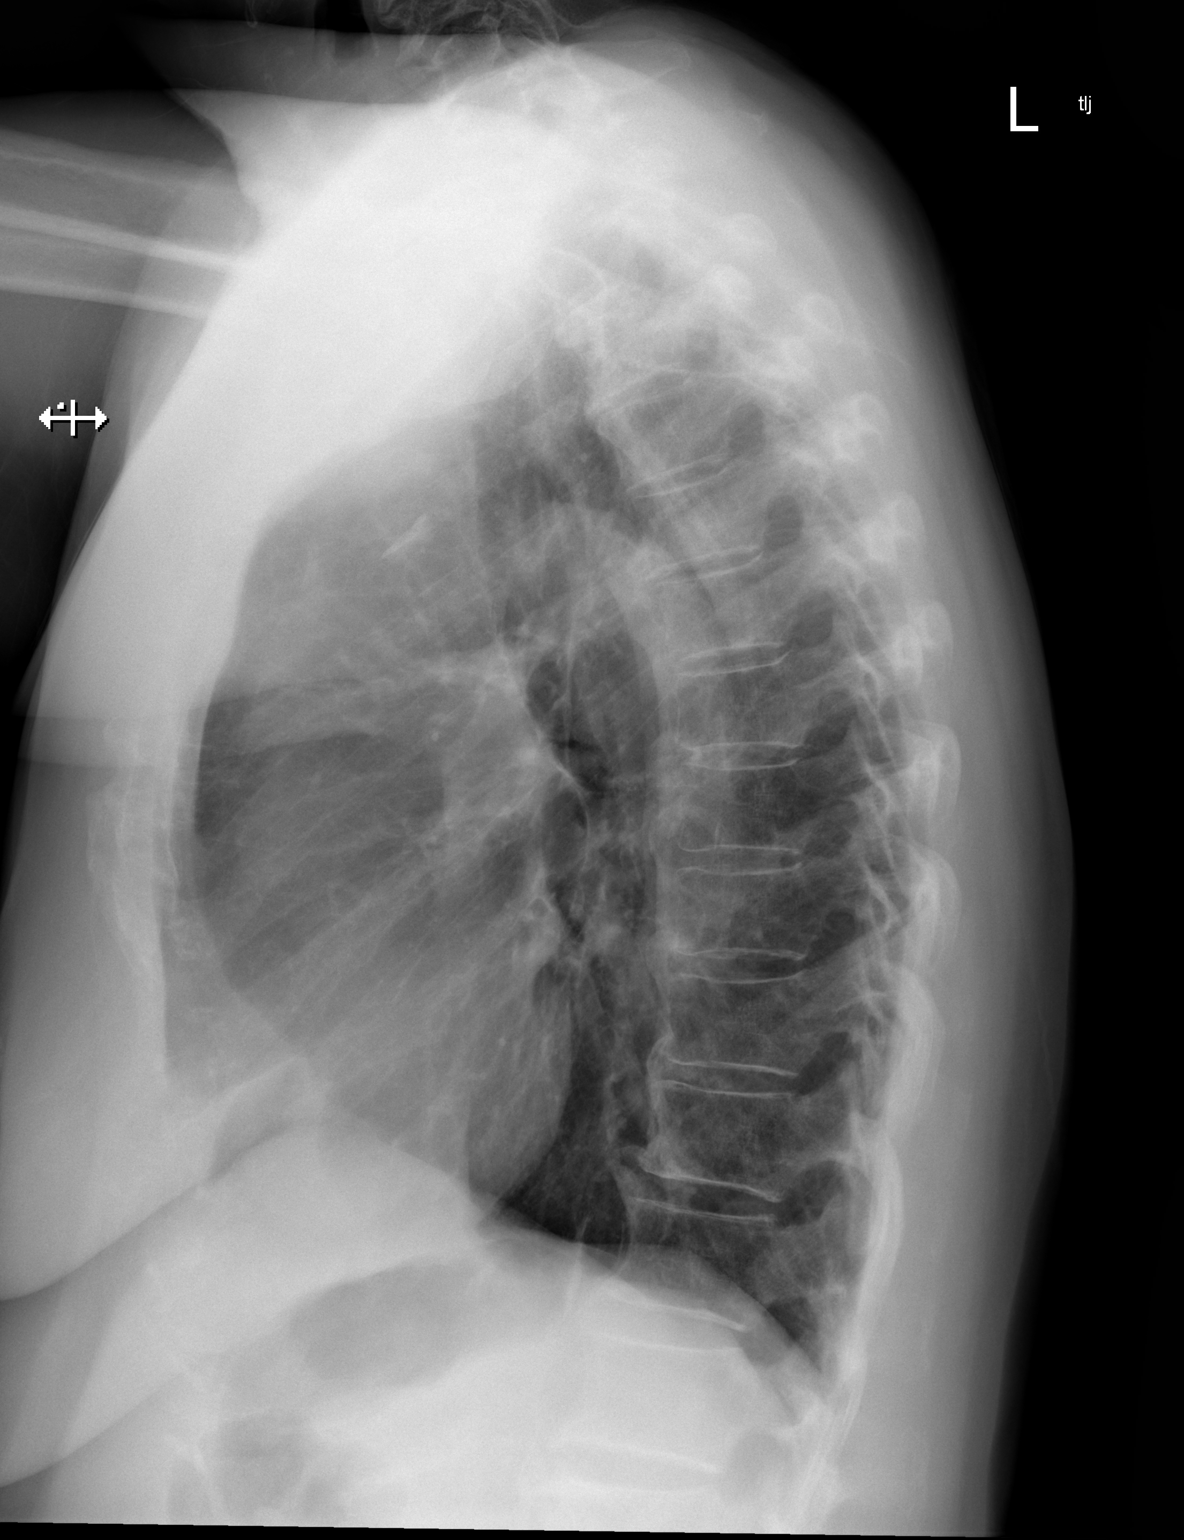

[2 of 2 positions shown; findings below may reference images not displayed]

FINDINGS: The heart size and mediastinal contours are within normal limits.
Small calcified granuloma is identified in the medial right apex
unchanged. Both lungs are otherwise clear. The visualized skeletal
structures are stable.
IMPRESSION: No active cardiopulmonary disease.

## 2022-11-08 DIAGNOSIS — Z961 Presence of intraocular lens: Secondary | ICD-10-CM | POA: Diagnosis not present

## 2022-12-23 DIAGNOSIS — Z9181 History of falling: Secondary | ICD-10-CM | POA: Diagnosis not present

## 2022-12-23 DIAGNOSIS — E785 Hyperlipidemia, unspecified: Secondary | ICD-10-CM | POA: Diagnosis not present

## 2022-12-23 DIAGNOSIS — R7303 Prediabetes: Secondary | ICD-10-CM | POA: Diagnosis not present

## 2022-12-23 DIAGNOSIS — Z23 Encounter for immunization: Secondary | ICD-10-CM | POA: Diagnosis not present

## 2022-12-23 DIAGNOSIS — G20A1 Parkinson's disease without dyskinesia, without mention of fluctuations: Secondary | ICD-10-CM | POA: Diagnosis not present

## 2022-12-23 DIAGNOSIS — I10 Essential (primary) hypertension: Secondary | ICD-10-CM | POA: Diagnosis not present

## 2022-12-23 DIAGNOSIS — Z0000 Encounter for general adult medical examination without abnormal findings: Secondary | ICD-10-CM | POA: Diagnosis not present

## 2022-12-29 DIAGNOSIS — R1032 Left lower quadrant pain: Secondary | ICD-10-CM | POA: Diagnosis not present

## 2023-03-09 ENCOUNTER — Other Ambulatory Visit: Payer: Self-pay | Admitting: Family Medicine

## 2023-03-09 DIAGNOSIS — Z1231 Encounter for screening mammogram for malignant neoplasm of breast: Secondary | ICD-10-CM

## 2023-04-08 ENCOUNTER — Ambulatory Visit: Payer: Medicare Other

## 2023-04-22 ENCOUNTER — Other Ambulatory Visit: Payer: Self-pay

## 2023-04-22 ENCOUNTER — Emergency Department (HOSPITAL_COMMUNITY)

## 2023-04-22 ENCOUNTER — Observation Stay (HOSPITAL_COMMUNITY)
Admission: EM | Admit: 2023-04-22 | Discharge: 2023-04-26 | Disposition: A | Discharge status: Skilled Nursing Facility | Attending: Internal Medicine | Admitting: Internal Medicine

## 2023-04-22 DIAGNOSIS — R531 Weakness: Secondary | ICD-10-CM | POA: Insufficient documentation

## 2023-04-22 DIAGNOSIS — Z79899 Other long term (current) drug therapy: Secondary | ICD-10-CM | POA: Insufficient documentation

## 2023-04-22 DIAGNOSIS — R197 Diarrhea, unspecified: Secondary | ICD-10-CM | POA: Insufficient documentation

## 2023-04-22 DIAGNOSIS — G20C Parkinsonism, unspecified: Secondary | ICD-10-CM | POA: Insufficient documentation

## 2023-04-22 DIAGNOSIS — R8271 Bacteriuria: Secondary | ICD-10-CM | POA: Insufficient documentation

## 2023-04-22 DIAGNOSIS — R112 Nausea with vomiting, unspecified: Secondary | ICD-10-CM | POA: Diagnosis not present

## 2023-04-22 DIAGNOSIS — Z85038 Personal history of other malignant neoplasm of large intestine: Secondary | ICD-10-CM | POA: Diagnosis not present

## 2023-04-22 DIAGNOSIS — Z853 Personal history of malignant neoplasm of breast: Secondary | ICD-10-CM | POA: Insufficient documentation

## 2023-04-22 DIAGNOSIS — I10 Essential (primary) hypertension: Secondary | ICD-10-CM | POA: Insufficient documentation

## 2023-04-22 DIAGNOSIS — R10814 Left lower quadrant abdominal tenderness: Secondary | ICD-10-CM | POA: Insufficient documentation

## 2023-04-22 DIAGNOSIS — E876 Hypokalemia: Secondary | ICD-10-CM | POA: Insufficient documentation

## 2023-04-22 DIAGNOSIS — Z96651 Presence of right artificial knee joint: Secondary | ICD-10-CM | POA: Insufficient documentation

## 2023-04-22 DIAGNOSIS — K449 Diaphragmatic hernia without obstruction or gangrene: Secondary | ICD-10-CM | POA: Diagnosis not present

## 2023-04-22 DIAGNOSIS — N200 Calculus of kidney: Secondary | ICD-10-CM | POA: Diagnosis not present

## 2023-04-22 LAB — URINALYSIS, ROUTINE W REFLEX MICROSCOPIC
Bilirubin Urine: NEGATIVE
Glucose, UA: NEGATIVE mg/dL
Ketones, ur: NEGATIVE mg/dL
Leukocytes,Ua: NEGATIVE
Nitrite: POSITIVE — AB
Protein, ur: NEGATIVE mg/dL
Specific Gravity, Urine: 1.01 (ref 1.005–1.030)
pH: 6 (ref 5.0–8.0)

## 2023-04-22 LAB — HEPATIC FUNCTION PANEL
ALT: 12 U/L (ref 0–44)
AST: 21 U/L (ref 15–41)
Albumin: 3.6 g/dL (ref 3.5–5.0)
Alkaline Phosphatase: 41 U/L (ref 38–126)
Bilirubin, Direct: 0.1 mg/dL (ref 0.0–0.2)
Indirect Bilirubin: 0.4 mg/dL (ref 0.3–0.9)
Total Bilirubin: 0.5 mg/dL (ref 0.0–1.2)
Total Protein: 6.3 g/dL — ABNORMAL LOW (ref 6.5–8.1)

## 2023-04-22 LAB — RESP PANEL BY RT-PCR (RSV, FLU A&B, COVID)  RVPGX2
Influenza A by PCR: NEGATIVE
Influenza B by PCR: NEGATIVE
Resp Syncytial Virus by PCR: NEGATIVE
SARS Coronavirus 2 by RT PCR: NEGATIVE

## 2023-04-22 LAB — BASIC METABOLIC PANEL
Anion gap: 10 (ref 5–15)
BUN: 22 mg/dL (ref 8–23)
CO2: 24 mmol/L (ref 22–32)
Calcium: 8.7 mg/dL — ABNORMAL LOW (ref 8.9–10.3)
Chloride: 105 mmol/L (ref 98–111)
Creatinine, Ser: 0.87 mg/dL (ref 0.44–1.00)
GFR, Estimated: >60 mL/min (ref >60)
Glucose, Bld: 118 mg/dL — ABNORMAL HIGH (ref 70–99)
Potassium: 3.3 mmol/L — ABNORMAL LOW (ref 3.5–5.1)
Sodium: 139 mmol/L (ref 135–145)

## 2023-04-22 LAB — CBC
HCT: 39 % (ref 36.0–46.0)
Hemoglobin: 12.5 g/dL (ref 12.0–15.0)
MCH: 30.1 pg (ref 26.0–34.0)
MCHC: 32.1 g/dL (ref 30.0–36.0)
MCV: 94 fL (ref 80.0–100.0)
Platelets: 195 10*3/uL (ref 150–400)
RBC: 4.15 MIL/uL (ref 3.87–5.11)
RDW: 14.7 % (ref 11.5–15.5)
WBC: 6.9 10*3/uL (ref 4.0–10.5)
nRBC: 0 % (ref 0.0–0.2)

## 2023-04-22 LAB — LIPASE, BLOOD: Lipase: 25 U/L (ref 11–51)

## 2023-04-22 LAB — MAGNESIUM: Magnesium: 2 mg/dL (ref 1.7–2.4)

## 2023-04-22 LAB — URINALYSIS, MICROSCOPIC (REFLEX)

## 2023-04-22 LAB — TROPONIN I (HIGH SENSITIVITY)
Troponin I (High Sensitivity): 11 ng/L (ref <18)
Troponin I (High Sensitivity): 10 ng/L (ref <18)

## 2023-04-22 LAB — CBG MONITORING, ED: Glucose-Capillary: 86 mg/dL (ref 70–99)

## 2023-04-22 MED ORDER — ACETAMINOPHEN 325 MG PO TABS
650.0000 mg | ORAL_TABLET | Freq: Four times a day (QID) | ORAL | Status: DC | PRN
  Administered 2023-04-24 (×2): 650 mg via ORAL
  Filled 2023-04-22 (×2): qty 2

## 2023-04-22 MED ORDER — HYDROMORPHONE HCL 1 MG/ML IJ SOLN
0.5000 mg | Freq: Once | INTRAMUSCULAR | Status: AC
  Administered 2023-04-22: 0.5 mg via INTRAVENOUS
  Filled 2023-04-22: qty 1

## 2023-04-22 MED ORDER — MEMANTINE HCL 10 MG PO TABS
10.0000 mg | ORAL_TABLET | Freq: Two times a day (BID) | ORAL | Status: DC
  Administered 2023-04-22 – 2023-04-26 (×8): 10 mg via ORAL
  Filled 2023-04-22 (×8): qty 1

## 2023-04-22 MED ORDER — POTASSIUM CHLORIDE 2 MEQ/ML IV SOLN
INTRAVENOUS | Status: AC
  Filled 2023-04-22 (×2): qty 1000

## 2023-04-22 MED ORDER — MELATONIN 5 MG PO TABS: 5.0000 mg | ORAL_TABLET | Freq: Every evening | ORAL | Status: DC | PRN

## 2023-04-22 MED ORDER — DONEPEZIL HCL 23 MG PO TABS
23.0000 mg | ORAL_TABLET | Freq: Every day | ORAL | Status: DC
  Administered 2023-04-22 – 2023-04-25 (×4): 23 mg via ORAL
  Filled 2023-04-22 (×5): qty 1

## 2023-04-22 MED ORDER — CARBIDOPA-LEVODOPA 25-100 MG PO TABS
1.0000 | ORAL_TABLET | Freq: Three times a day (TID) | ORAL | Status: DC
  Administered 2023-04-22 – 2023-04-26 (×11): 1 via ORAL
  Filled 2023-04-22 (×11): qty 1

## 2023-04-22 MED ORDER — SODIUM CHLORIDE 0.9 % IV BOLUS
1000.0000 mL | Freq: Once | INTRAVENOUS | Status: AC
  Administered 2023-04-22: 1000 mL via INTRAVENOUS

## 2023-04-22 MED ORDER — ONDANSETRON HCL 4 MG/2ML IJ SOLN
4.0000 mg | Freq: Once | INTRAMUSCULAR | Status: AC
  Administered 2023-04-22: 4 mg via INTRAVENOUS
  Filled 2023-04-22: qty 2

## 2023-04-22 MED ORDER — IOHEXOL 350 MG/ML SOLN
75.0000 mL | Freq: Once | INTRAVENOUS | Status: AC | PRN
  Administered 2023-04-22: 75 mL via INTRAVENOUS

## 2023-04-22 MED ORDER — PROCHLORPERAZINE EDISYLATE 10 MG/2ML IJ SOLN: 5.0000 mg | Freq: Four times a day (QID) | INTRAMUSCULAR | Status: DC | PRN

## 2023-04-22 MED ORDER — ENOXAPARIN SODIUM 40 MG/0.4ML IJ SOSY
40.0000 mg | PREFILLED_SYRINGE | INTRAMUSCULAR | Status: DC
  Administered 2023-04-23 – 2023-04-26 (×4): 40 mg via SUBCUTANEOUS
  Filled 2023-04-22 (×4): qty 0.4

## 2023-04-22 MED ORDER — ADULT MULTIVITAMIN W/MINERALS CH
1.0000 | ORAL_TABLET | Freq: Every day | ORAL | Status: DC
  Administered 2023-04-23 – 2023-04-26 (×4): 1 via ORAL
  Filled 2023-04-22 (×5): qty 1

## 2023-04-22 MED ORDER — OMEGA-3-ACID ETHYL ESTERS 1 G PO CAPS
1.0000 | ORAL_CAPSULE | Freq: Two times a day (BID) | ORAL | Status: DC
  Administered 2023-04-23 – 2023-04-26 (×7): 1 g via ORAL
  Filled 2023-04-22 (×8): qty 1

## 2023-04-22 NOTE — ED Provider Triage Note (Signed)
 Emergency Medicine Provider Triage Evaluation Note  CHARNISE LOVAN , a 83 y.o. female  was evaluated in triage.  Pt complains of vomiting and diarrhea and weakness.  Starting 2 days ago has had vomiting and diarrhea and then this morning was unable to walk due to bilateral leg weakness.  Feels like she has been having left lower quadrant abdominal pain and a diverticulitis flareup for the past week.  Review of Systems  Positive: Abdominal pain, vomiting, diarrhea, leg weakness Negative: Unilateral weakness, chest pain  Physical Exam  BP 119/70 (BP Location: Right Arm)   Pulse 92   Temp 98.9 F (37.2 C)   Resp 16   Ht 5\' 8"  (1.727 m)   Wt 86.2 kg   SpO2 98%   BMI 28.89 kg/m  Gen:   Awake, no distress   Resp:  Normal effort  Abd:   Mild left lower quadrant abdominal tenderness  Medical Decision Making  Medically screening exam initiated at 9:23 AM.  Appropriate orders placed.  SHANEISHA BURKEL was informed that the remainder of the evaluation will be completed by another provider, this initial triage assessment does not replace that evaluation, and the importance of remaining in the ED until their evaluation is complete.  Labs, ECG and CT abdomen and pelvis have been ordered.  Vital signs are stable.   Pricilla Loveless, MD 04/22/23 [phone]

## 2023-04-22 NOTE — ED Triage Notes (Addendum)
 Pt. Stated, Ive had diarrhea and leg weakness since Wednesday. I was throwing up like crazy. I do have diverticulitis.  Daughter stated, she has been going to the nursing home a lot to visit her husband who has had his leg amputated. She is probably worn out.

## 2023-04-22 NOTE — ED Provider Notes (Signed)
  Physical Exam  BP (!) 141/53   Pulse 60   Temp 97.6 F (36.4 C) (Oral)   Resp 10   Ht 5\' 8"  (1.727 m)   Wt 86.2 kg   SpO2 97%   BMI 28.89 kg/m   Physical Exam  Procedures  Procedures  ED Course / MDM   Clinical Course as of 04/22/23 2210  Fri Apr 22, 2023  1510 N/V/D since Wed, in/out nursing home for husband, hx diverticulitis,  [ ]  pending scan, urinalysis, PO challenge (give K PO) [HG]    Clinical Course User Index [HG] Renella Cunas, MD   Medical Decision Making Amount and/or Complexity of Data Reviewed Labs: ordered.  Risk Prescription drug management.   At the time of handoff, I was following up on CT of abdomen and pelvis.  Please see prior ED providers note for HPI and physical exam.   CT abdomen and pelvis notable for no acute findings, including no pleural effusion, no obstructive nephrolithiasis, no bowel obstruction, no free fluid or air in the abdomen.  On my reevaluation, patient has limited strength of bilateral lower extremities, right weaker than left.  States this is new as of this morning after multiple episodes of emesis and diarrhea over the last 2 days.  Patient denies episodes of emesis and diarrhea today after receiving an antiemetic.  Patient is unable to ambulate while in the ED.  Additionally, patient endorses a lack of hunger as well as nausea.  Patient states she had similar symptoms of full body weakness during a viral infection several years ago.  She was unable to ambulate at that time as well.  Given significant diarrhea, GI pathogen panel as well as a C. difficile panel were ordered.  Patient is felt to require admission given likely viral illness with acute emesis and diarrhea, generalized weakness, inability to p.o., and inability to ambulate.  Handoff was called to inpatient hospitalist, Dr. Margo Aye, who will be admitting the patient for the the above.  Renella Cunas, PGY-2 Emergency Medicine   Renella Cunas, MD 04/22/23 [id number]     Alvira Monday, MD 04/25/23 2253

## 2023-04-22 NOTE — ED Provider Notes (Signed)
 [place] EMERGENCY DEPARTMENT AT [facility] Provider Note   CSN: [id number] Arrival date & time: 04/22/23  6962     History  Chief Complaint  Patient presents with   Diarrhea   Weakness   Emesis   Nausea   HPI Katie Carrillo is a 83 y.o. female with history of dementia, Parkinson's, diverticulitis, and hypertension presenting for nausea vomiting diarrhea.  Started Wednesday.  She also reports some left-sided lower abdominal tenderness.  Denies urinary symptoms.  States she has been going back and forth from the nursing home recently to visit her husband who was recently admitted there.  She denies any bloody output.  She does report a history of diverticulitis.  States she last threw up yesterday.  But has not had much to eat or drink.  States the abdominal pain is still present but has eased somewhat.   Diarrhea Associated symptoms: vomiting   Weakness Associated symptoms: diarrhea and vomiting   Emesis Associated symptoms: diarrhea        Home Medications Prior to Admission medications   Medication Sig Start Date End Date Taking? Authorizing Provider  acetaminophen (TYLENOL) 325 MG tablet Take 325-650 mg by mouth every 8 (eight) hours as needed for mild pain or headache.     [provider]  carbidopa-levodopa (SINEMET IR) 25-100 MG tablet Take 1 tablet by mouth 3 (three) times daily.    [provider]  donepezil (ARICEPT) 23 MG TABS tablet Take 23 mg by mouth at bedtime.    [provider]  lisinopril-hydrochlorothiazide (PRINZIDE,ZESTORETIC) 10-12.5 MG per tablet Take 0.5 tablets by mouth daily. 08/30/11   [provider]  memantine (NAMENDA) 10 MG tablet Take 10 mg by mouth 2 (two) times daily.    [provider]  Multiple Vitamin (MULITIVITAMIN WITH MINERALS) TABS Take 1 tablet by mouth daily.    [provider]  Omega-3 Fatty Acids (FISH OIL) 1200 MG CAPS Take 1,200 mg by mouth every evening.     [provider]  oxybutynin (DITROPAN XL) 10 MG 24 hr tablet Take 1 tablet (10 mg total) by mouth at bedtime. Patient not taking: Reported on 05/22/2021 09/16/20 10/16/20  Myna Hidalgo, DO  polyethylene glycol (MIRALAX / GLYCOLAX) 17 g packet Take 17 g by mouth daily.    [provider]  propranolol (INDERAL) 10 MG tablet Take 10 mg by mouth 2 (two) times daily.    [provider]  Red Yeast Rice 600 MG CAPS Take 1,200 mg by mouth every evening.  Patient not taking: Reported on 06/16/2021    [provider]  traMADol (ULTRAM) 50 MG tablet Take 1 tablet (50 mg total) by mouth every 12 (twelve) hours as needed for moderate pain. 03/14/17   Rolland Porter, MD  triamcinolone ointment (KENALOG) 0.5 % Pea-sized amount to affected area once daily as needed 06/16/21   Myna Hidalgo, DO      Allergies    Morphine and codeine    Review of Systems   Review of Systems  Gastrointestinal:  Positive for diarrhea and vomiting.  Neurological:  Positive for weakness.    Physical Exam Updated Vital Signs BP 105/60   Pulse 66   Temp 98.7 F (37.1 C) (Oral)   Resp 13   Ht 5\' 8"  (1.727 m)   Wt 86.2 kg   SpO2 97%   BMI 28.89 kg/m  Physical Exam Vitals and nursing note reviewed.  HENT:     Head: Normocephalic and atraumatic.  Mouth/Throat:     Mouth: Mucous membranes are moist.  Eyes:     General:        Right eye: No discharge.        Left eye: No discharge.     Conjunctiva/sclera: Conjunctivae normal.  Cardiovascular:     Rate and Rhythm: Normal rate and regular rhythm.     Pulses: Normal pulses.     Heart sounds: Normal heart sounds.  Pulmonary:     Effort: Pulmonary effort is normal.     Breath sounds: Normal breath sounds.  Abdominal:     General: Abdomen is flat. There is no distension.     Palpations: Abdomen is soft.     Tenderness: There is abdominal tenderness in the left lower quadrant.  Skin:    General: Skin is warm and dry.      Coloration: Skin is pale.  Neurological:     General: No focal deficit present.  Psychiatric:        Mood and Affect: Mood normal.     ED Results / Procedures / Treatments   Labs (all labs ordered are listed, but only abnormal results are displayed) Labs Reviewed  BASIC METABOLIC PANEL - Abnormal; Notable for the following components:      Result Value   Potassium 3.3 (*)    Glucose, Bld 118 (*)    Calcium 8.7 (*)    All other components within normal limits  HEPATIC FUNCTION PANEL - Abnormal; Notable for the following components:   Total Protein 6.3 (*)    All other components within normal limits  RESP PANEL BY RT-PCR (RSV, FLU A&B, COVID)  [id number]  CBC  LIPASE, BLOOD  MAGNESIUM  URINALYSIS, ROUTINE W REFLEX MICROSCOPIC  CBG MONITORING, ED  TROPONIN I (HIGH SENSITIVITY)  TROPONIN I (HIGH SENSITIVITY)    EKG EKG Interpretation Date/Time:  Friday April 22 2023 09:21:13 EDT Ventricular Rate:  94 PR Interval:  188 QRS Duration:  66 QT Interval:  340 QTC Calculation: 425 R Axis:   63  Text Interpretation: Sinus rhythm with marked sinus arrhythmia with occasional Premature ventricular complexes Nonspecific ST and T wave abnormality Confirmed by Pricilla Loveless [phone]) on 04/22/2023 9:50:26 AM  Radiology No results found.  Procedures Procedures    Medications Ordered in ED Medications  sodium chloride 0.9 % bolus 1,000 mL (1,000 mLs Intravenous New Bag/Given 04/22/23 1256)  ondansetron (ZOFRAN) injection 4 mg (4 mg Intravenous Given 04/22/23 1250)  HYDROmorphone (DILAUDID) injection 0.5 mg (0.5 mg Intravenous Given 04/22/23 1251)  iohexol (OMNIPAQUE) 350 MG/ML injection 75 mL (75 mLs Intravenous Contrast Given 04/22/23 1327)    ED Course/ Medical Decision Making/ A&P Clinical Course as of 04/22/23 1523  Fri Apr 22, 2023  1510 N/V/D since Wed, in/out nursing home for husband, hx diverticulitis,  [ ]  pending scan, urinalysis, PO challenge (give K PO) [HG]     Clinical Course User Index [HG] Renella Cunas, MD                                 Medical Decision Making Amount and/or Complexity of Data Reviewed Labs: ordered.  Risk Prescription drug management.   Initial Impression and Ddx 83 year old well-appearing female presenting for nausea vomiting diarrhea and abdominal pain.  Exam notable for left lower quadrant abdominal pain.  DDx includes diverticulitis, kidney stone, pyelonephritis, electrolyte derangement, dehydration, sepsis, other. Patient PMH that increases complexity of ED encounter:  history of dementia, Parkinson's, diverticulitis, and hypertension  Interpretation of Diagnostics - I independent reviewed and interpreted the labs as followed: mild hypokalemia (3.3)  - CT ab/pelvis is pending  Patient Reassessment and Ultimate Disposition/Management Workup thus far are reassuring.  Suspect viral process, nevertheless CT is pending to further for possible intra-abdominal infection.  Labs revealing mild hypokalemia.  Plan at this time is to follow-up on CT, fluid challenge and orally replete potassium if possible.  If remaining workup is reassuring and patient is feeling better will likely be discharged with PCP follow-up.  Signed out patient to oncoming ED resident, Dr. Renella Cunas.  Patient management required discussion with the following services or consulting groups:  None  Complexity of Problems Addressed Acute complicated illness or Injury  Additional Data Reviewed and Analyzed Further history obtained from: Further history from spouse/family member, Past medical history and medications listed in the EMR, and Prior ED visit notes  Patient Encounter Risk Assessment Consideration of hospitalization         Final Clinical Impression(s) / ED Diagnoses Final diagnoses:  Nausea vomiting and diarrhea    Rx / DC Orders ED Discharge Orders     None         Gareth Eagle, PA-C 04/22/23 1523    Coral Spikes, DO 04/22/23 1528

## 2023-04-22 NOTE — H&P (Addendum)
 History and Physical  Katie Carrillo:[id number] DOB: [birth year] DOA: 04/22/2023  Referring physician: Dr. Renella Cunas, Resident-EDP  PCP: Daisy Floro, MD  Outpatient Specialists: Gynecology, ophthalmology. Patient coming from: Home  Chief Complaint: Nausea, vomiting, diarrhea   HPI: Katie Carrillo is a 83 y.o. female with medical history significant for Parkinson's disease, essential hypertension, history of breast cancer and colon cancer presents with complaints of 2 days of profuse diarrhea, associated with left lower quadrant abdominal pain, intractable nausea, and vomiting.  Denies recent use of antibiotics.  Denies known sick contacts.  No reported subjective fevers or chills.  No cardiopulmonary symptoms.  Has not been able to tolerate oral intake.  Also admits to generalized weakness to the point where she is unable to ambulate without assistance since yesterday.  She was brought into the ER for further evaluation.  In the ER, weak appearing, with persistent nausea and unable to tolerate oral intake.  She received IV antiemetics, IV opiate based analgesics, and IV fluid 1 L NS bolus x 1.  CT abdomen and pelvis with contrast was unrevealing.  It showed no acute abdominal pelvic findings, punctate nonobstructing right renal stone, small hiatal hernia, and aortic atherosclerosis.  UA with nitrite positive.  Influenza A&B, RSV, COVID-19, all by PCR negative.  Due to persistent symptomatology, TRH, hospitalist service, was asked to admit.  ED Course: Temperature 97.6.  BP 114/55, pulse 93, respiratory 17, O2 saturation 96% on room air.  Lab studies notable for serum potassium 3.3, glucose 118, high-sensitivity troponin 11, 10.  CBC unremarkable.  Review of Systems: Review of systems as noted in the HPI. All other systems reviewed and are negative.   Past Medical History:  Diagnosis Date   Cancer [facility])    breast      lumpectomy   lt   Colon cancer (HCC)    Dementia     Hypertension    dr Madelin Rear    [employer]   Parkinson's disease    Personal history of radiation therapy 2008   Urinary urgency    Past Surgical History:  Procedure Laterality Date   ABDOMINAL HYSTERECTOMY     BREAST BIOPSY Left 01/2006   BREAST EXCISIONAL BIOPSY Right    BREAST LUMPECTOMY Left 02/2006   BREAST SURGERY     lt   hemmoriods     TOTAL KNEE ARTHROPLASTY  06/30/2011   Procedure: TOTAL KNEE ARTHROPLASTY;  Surgeon: [name], MD;  Location: [facility] OR;  Service: Orthopedics;  Laterality: Right;    Social History:  reports that she has never smoked. She has never used smokeless tobacco. She reports that she does not drink alcohol and does not use drugs.   Allergies  Allergen Reactions   Morphine And Codeine Other (See Comments)    Hallucinations     Family history: None reported.  Prior to Admission medications   Medication Sig Start Date End Date Taking? Authorizing Provider  acetaminophen (TYLENOL) 325 MG tablet Take 325-650 mg by mouth every 8 (eight) hours as needed for mild pain or headache.     [provider]  carbidopa-levodopa (SINEMET IR) 25-100 MG tablet Take 1 tablet by mouth 3 (three) times daily.    [provider]  donepezil (ARICEPT) 23 MG TABS tablet Take 23 mg by mouth at bedtime.    [provider]  lisinopril-hydrochlorothiazide (PRINZIDE,ZESTORETIC) 10-12.5 MG per tablet Take 0.5 tablets by mouth daily. 08/30/11   [provider]  memantine (NAMENDA) 10 MG  tablet Take 10 mg by mouth 2 (two) times daily.    [provider]  Multiple Vitamin (MULITIVITAMIN WITH MINERALS) TABS Take 1 tablet by mouth daily.    [provider]  Omega-3 Fatty Acids (FISH OIL) 1200 MG CAPS Take 1,200 mg by mouth every evening.    [provider]  oxybutynin (DITROPAN XL) 10 MG 24 hr tablet Take 1 tablet (10 mg total) by mouth at bedtime. Patient not taking: Reported on 05/22/2021 09/16/20 10/16/20  Katie Hidalgo, DO  polyethylene glycol (MIRALAX / GLYCOLAX) 17 g packet Take 17 g by mouth daily.    [provider]  propranolol (INDERAL) 10 MG tablet Take 10 mg by mouth 2 (two) times daily.    [provider]  Red Yeast Rice 600 MG CAPS Take 1,200 mg by mouth every evening.  Patient not taking: Reported on 06/16/2021    [provider]  traMADol (ULTRAM) 50 MG tablet Take 1 tablet (50 mg total) by mouth every 12 (twelve) hours as needed for moderate pain. 03/14/17   Rolland Porter, MD  triamcinolone ointment (KENALOG) 0.5 % Pea-sized amount to affected area once daily as needed 06/16/21   Katie Hidalgo, DO    Physical Exam: BP (!) 141/53   Pulse 60   Temp 97.6 F (36.4 C) (Oral)   Resp 10   Ht 5\' 8"  (1.727 m)   Wt 86.2 kg   SpO2 97%   BMI 28.89 kg/m   General: 83 y.o. year-old female well developed well nourished in no acute distress.  Alert and oriented x3. Cardiovascular: Regular rate and rhythm with no rubs or gallops.  No thyromegaly or JVD noted.  No lower extremity edema. 2/4 pulses in all 4 extremities. Respiratory: Clear to auscultation with no wheezes or rales. Good inspiratory effort. Abdomen: Soft nontender nondistended with normal bowel sounds x4 quadrants. Muskuloskeletal: No cyanosis, clubbing or edema noted bilaterally Neuro: CN II-XII intact, strength, sensation, reflexes Skin: No ulcerative lesions noted or rashes Psychiatry: Judgement and insight appear normal. Mood is appropriate for condition and setting          Labs on Admission:  Basic Metabolic Panel: Recent Labs  Lab 04/22/23 0914  NA 139  K 3.3*  CL 105  CO2 24  GLUCOSE 118*  BUN 22  CREATININE 0.87  CALCIUM 8.7*  MG 2.0   Liver Function Tests: Recent Labs  Lab 04/22/23 0914  AST 21  ALT 12  ALKPHOS 41  BILITOT 0.5  PROT 6.3*  ALBUMIN 3.6   Recent Labs  Lab 04/22/23 0914  LIPASE 25   No results for input(s): "AMMONIA" in the last 168 hours. CBC: Recent  Labs  Lab 04/22/23 0914  WBC 6.9  HGB 12.5  HCT 39.0  MCV 94.0  PLT 195   Cardiac Enzymes: No results for input(s): "CKTOTAL", "CKMB", "CKMBINDEX", "TROPONINI" in the last 168 hours.  BNP (last 3 results) No results for input(s): "BNP" in the last 8760 hours.  ProBNP (last 3 results) No results for input(s): "PROBNP" in the last 8760 hours.  CBG: Recent Labs  Lab 04/22/23 1225  GLUCAP 86    Radiological Exams on Admission: CT ABDOMEN PELVIS W CONTRAST Result Date: 04/22/2023 CLINICAL DATA:  Left lower quadrant pain with nausea, diarrhea, emesis and weakness EXAM: CT ABDOMEN AND PELVIS WITH CONTRAST TECHNIQUE: Multidetector CT imaging of the abdomen and pelvis was performed using the standard protocol following bolus administration of intravenous contrast. RADIATION DOSE REDUCTION: This exam  was performed according to the departmental dose-optimization program which includes automated exposure control, adjustment of the mA and/or kV according to patient size and/or use of iterative reconstruction technique. CONTRAST:  75mL OMNIPAQUE IOHEXOL 350 MG/ML SOLN COMPARISON:  CT abdomen and pelvis dated 04/05/2022, CTA chest dated 05/22/2021 FINDINGS: Lower chest: Subsolid right middle lobe nodule measuring 4 mm (5:1) is unchanged since 05/22/2021. No specific follow-up imaging recommended. No pleural effusion or pneumothorax demonstrated. Partially imaged heart size is normal. Hepatobiliary: Scattered subcentimeter hypodensities, too small to characterize. No intra or extrahepatic biliary ductal dilation. Normal gallbladder. Pancreas: No focal lesions or main ductal dilation. Spleen: Normal in size without focal abnormality. Adrenals/Urinary Tract: 6 mm myelolipoma in the right adrenal gland. No specific follow-up imaging recommended. No left adrenal nodule. No suspicious renal mass or hydronephrosis. Punctate nonobstructing right renal stone. Unchanged bilateral cysts. No specific follow-up  imaging recommended. No focal bladder wall thickening. Stomach/Bowel: Small hiatal hernia. Normal appearance of the stomach. Postsurgical changes of the right upper quadrant with patent anastomosis. Appendectomy. Vascular/Lymphatic: Aortic atherosclerosis. No enlarged abdominal or pelvic lymph nodes. Reproductive: No adnexal masses. Other: No free fluid, fluid collection, or free air. Musculoskeletal: No acute or abnormal lytic or blastic osseous lesions. Multilevel degenerative changes of the partially imaged thoracic and lumbar spine. IMPRESSION: 1. No acute abdominopelvic findings. 2. Punctate nonobstructing right renal stone. 3. Small hiatal hernia. 4.  Aortic Atherosclerosis (ICD10-I70.0). Electronically Signed   By: Agustin Cree M.D.   On: 04/22/2023 15:43    EKG: I independently viewed the EKG done and my findings are as followed: Sinus rhythm rate of 94.  Sinus arrhythmia.  Occasional PVCs, QTc 425.  Assessment/Plan Present on Admission:  Intractable nausea and vomiting  Principal Problem:   Intractable nausea and vomiting  Intractable nausea and vomiting, suspect viral gastroenteritis Continue supportive care IV fluid hydration IV antiemetics Replete electrolytes as indicated Clear liquid diet, advance as tolerated Early mobilization as tolerated  Acute diarrhea, rule out active infective process CT abdomen pelvis was unrevealing C. difficile PCR and GI panel by PCR ordered by EDP, follow Continue supportive care  Nitrite positive UA The patient denies dysuria, increase in urinary frequency, or suprapubic pain Will hold off antibiotics, since the patient is asymptomatic  Parkinson's disease Resume home regimen Fall precautions  Hypertension Hold off home oral antihypertensives due to soft BPs Closely monitor vital signs Maintain MAP greater than 65.  Generalized weakness/physical debility PT OT assessment Fall precautions  Hypokalemia Serum potassium 3.3 Repleted  intravenously with LR KCl at 50 cc/h x 1 day Repeat BMP in the morning Check magnesium level   Time: 75 minutes.   DVT prophylaxis: Subcu Lovenox daily  Code Status: Full code  Family Communication: Family member at bedside  Disposition Plan: Admitted to telemetry medical unit  Consults called: None  Admission status: Observation status   Status is: Observation    Darlin Drop MD [facility] Pager [phone]  If 7PM-7AM, please contact night-coverage [email or web address] Password [facility]  04/22/2023, 9:41 PM

## 2023-04-23 ENCOUNTER — Encounter (HOSPITAL_COMMUNITY): Payer: Self-pay | Admitting: Internal Medicine

## 2023-04-23 DIAGNOSIS — R112 Nausea with vomiting, unspecified: Secondary | ICD-10-CM

## 2023-04-23 LAB — BASIC METABOLIC PANEL
Anion gap: 8 (ref 5–15)
BUN: 14 mg/dL (ref 8–23)
CO2: 24 mmol/L (ref 22–32)
Calcium: 8.4 mg/dL — ABNORMAL LOW (ref 8.9–10.3)
Chloride: 109 mmol/L (ref 98–111)
Creatinine, Ser: 0.64 mg/dL (ref 0.44–1.00)
GFR, Estimated: >60 mL/min (ref >60)
Glucose, Bld: 84 mg/dL (ref 70–99)
Potassium: 3.3 mmol/L — ABNORMAL LOW (ref 3.5–5.1)
Sodium: 141 mmol/L (ref 135–145)

## 2023-04-23 LAB — CBC
HCT: 35.2 % — ABNORMAL LOW (ref 36.0–46.0)
Hemoglobin: 11 g/dL — ABNORMAL LOW (ref 12.0–15.0)
MCH: 29.8 pg (ref 26.0–34.0)
MCHC: 31.3 g/dL (ref 30.0–36.0)
MCV: 95.4 fL (ref 80.0–100.0)
Platelets: 143 10*3/uL — ABNORMAL LOW (ref 150–400)
RBC: 3.69 MIL/uL — ABNORMAL LOW (ref 3.87–5.11)
RDW: 14.8 % (ref 11.5–15.5)
WBC: 4.6 10*3/uL (ref 4.0–10.5)
nRBC: 0 % (ref 0.0–0.2)

## 2023-04-23 LAB — MAGNESIUM: Magnesium: 2 mg/dL (ref 1.7–2.4)

## 2023-04-23 LAB — PHOSPHORUS: Phosphorus: 3.4 mg/dL (ref 2.5–4.6)

## 2023-04-23 MED ORDER — POTASSIUM CHLORIDE CRYS ER 20 MEQ PO TBCR
40.0000 meq | EXTENDED_RELEASE_TABLET | Freq: Once | ORAL | Status: AC
  Administered 2023-04-23: 40 meq via ORAL
  Filled 2023-04-23: qty 2

## 2023-04-23 NOTE — Progress Notes (Signed)
 PROGRESS NOTE  Katie Carrillo  ZHY:[id number] DOB: 04/29/1940 DOA: 04/22/2023 PCP: Daisy Floro, MD  Consultants  Brief Narrative: 83 y.o. female with medical history significant for Parkinson's disease, essential hypertension, history of breast cancer and colon cancer presents with complaints of 2 days of profuse diarrhea, associated with left lower quadrant abdominal pain, intractable nausea, and vomiting.  Denies recent use of antibiotics.  Denies known sick contacts.  No reported subjective fevers or chills.  No cardiopulmonary symptoms.  Has not been able to tolerate oral intake.  Also admits to generalized weakness to the point where she is unable to ambulate without assistance since yesterday.  She was brought into the ER for further evaluation.  Admitted to the hospitalist service due to ongoing weakness.   Assessment & Plan: Intractable nausea and vomiting, suspect viral gastroenteritis -Better today.  No further nausea, vomiting, diarrhea. -Will continue IV hydration x 1 more day. -She does report that her husband lives in a nursing home and she has been visiting him frequently including today prior to admission. -Sounds like it is most likely she picked up norovirus or some other GI pathogen. -Regardless she is steadily improving.  Advance diet as tolerated.      Acute diarrhea, rule out active infective process CT abdomen pelvis was unrevealing Negative pathogen panels No further bowel movements since admission.   Nitrite positive UA The patient denies dysuria, increase in urinary frequency, or suprapubic pain Will hold off antibiotics, since the patient is asymptomatic   Parkinson's disease Resume home regimen.  Does have notable tremor at baseline. Fall precautions   Hypertension Antihypertensive held on admission. -Blood pressure is slowly creeping upwards and we will continue to watch off blood pressure medicines.  Blood pressure enters hypertensive range plan  will be to restart home medications.   Generalized weakness/physical debility PT OT assessment Fall precautions   Hypokalemia Serum potassium 3.3 after replacement. -Will replace again today. -Mag normal.  -  Likely secondary to ongoing gastrointestinal losses.       DVT prophylaxis:  enoxaparin (LOVENOX) injection 40 mg Start: 04/23/23 1000  Code Status:   Code Status: Full Code Level of care: Telemetry Medical Status is: inpatient   Subjective: Patient feels better today than she has been.  Still feels weak.  No further nausea or vomiting.  No diarrhea.  Not hungry however  Objective: Vitals:   04/23/23 0559 04/23/23 0846 04/23/23 1148 04/23/23 1635  BP: 128/87 (!) 125/56 139/69 (!) 123/59  Pulse: 86 (!) 57 74 (!) 54  Resp: 18 20 18 18   Temp: 97.6 F (36.4 C) 97.9 F (36.6 C) 97.9 F (36.6 C) 97.9 F (36.6 C)  TempSrc:  Oral Oral Oral  SpO2: 95% 95% 98% 98%  Weight:      Height:        Intake/Output Summary (Last 24 hours) at 04/23/2023 1705 Last data filed at 04/23/2023 2952 Gross per 24 hour  Intake 286.88 ml  Output --  Net 286.88 ml   Filed Weights   04/22/23 0902  Weight: 86.2 kg   Body mass index is 28.89 kg/m.  Gen: 83 y.o. female in no apparent distress.  Nontoxic Pulm: Non-labored breathing.  Clear to auscultation bilaterally.  CV: Regular rate and rhythm. No murmur, rub, or gallop. No JVD GI: Abdomen soft, nondistended, minimally tender throughout without guarding or rebound. Ext: Warm, no deformities, no pedal edema Skin: No rashes, lesions no ulcers Neuro: Alert and oriented. No focal neurological deficits.  Does have resting pill-rolling tremor bilateral hands. Psych: Calm  Judgement and insight appear normal. Mood & affect appropriate.     I have personally reviewed the following labs and images: CBC: Recent Labs  Lab 04/22/23 0914 04/23/23 0644  WBC 6.9 4.6  HGB 12.5 11.0*  HCT 39.0 35.2*  MCV 94.0 95.4  PLT 195 143*   BMP  &GFR Recent Labs  Lab 04/22/23 0914 04/23/23 0644  NA 139 141  K 3.3* 3.3*  CL 105 109  CO2 24 24  GLUCOSE 118* 84  BUN 22 14  CREATININE 0.87 0.64  CALCIUM 8.7* 8.4*  MG 2.0 2.0  PHOS  --  3.4   Estimated Creatinine Clearance: 62.3 mL/min (by C-G formula based on SCr of 0.64 mg/dL). Liver & Pancreas: Recent Labs  Lab 04/22/23 0914  AST 21  ALT 12  ALKPHOS 41  BILITOT 0.5  PROT 6.3*  ALBUMIN 3.6   Recent Labs  Lab 04/22/23 0914  LIPASE 25   No results for input(s): "AMMONIA" in the last 168 hours. Diabetic: No results for input(s): "HGBA1C" in the last 72 hours. Recent Labs  Lab 04/22/23 1225  GLUCAP 86   Cardiac Enzymes: No results for input(s): "CKTOTAL", "CKMB", "CKMBINDEX", "TROPONINI" in the last 168 hours. No results for input(s): "PROBNP" in the last 8760 hours. Coagulation Profile: No results for input(s): "INR", "PROTIME" in the last 168 hours. Thyroid Function Tests: No results for input(s): "TSH", "T4TOTAL", "FREET4", "T3FREE", "THYROIDAB" in the last 72 hours. Lipid Profile: No results for input(s): "CHOL", "HDL", "LDLCALC", "TRIG", "CHOLHDL", "LDLDIRECT" in the last 72 hours. Anemia Panel: No results for input(s): "VITAMINB12", "FOLATE", "FERRITIN", "TIBC", "IRON", "RETICCTPCT" in the last 72 hours. Urine analysis:    Component Value Date/Time   COLORURINE YELLOW 04/22/2023 1700   APPEARANCEUR CLEAR 04/22/2023 1700   APPEARANCEUR Clear 05/21/2020 1615   LABSPEC 1.010 04/22/2023 1700   PHURINE 6.0 04/22/2023 1700   GLUCOSEU NEGATIVE 04/22/2023 1700   HGBUR MODERATE (A) 04/22/2023 1700   BILIRUBINUR NEGATIVE 04/22/2023 1700   BILIRUBINUR Negative 05/21/2020 1615   KETONESUR NEGATIVE 04/22/2023 1700   PROTEINUR NEGATIVE 04/22/2023 1700   UROBILINOGEN 0.2 06/18/2011 1036   NITRITE POSITIVE (A) 04/22/2023 1700   LEUKOCYTESUR NEGATIVE 04/22/2023 1700   Sepsis Labs: Invalid input(s): "PROCALCITONIN", "LACTICIDVEN"  Microbiology: Recent  Results (from the past 240 hours)  Resp panel by RT-PCR (RSV, Flu A&B, Covid) Anterior Nasal Swab     Status: None   Collection Time: 04/22/23  9:23 AM   Specimen: Anterior Nasal Swab  Result Value Ref Range Status   SARS Coronavirus 2 by RT PCR NEGATIVE NEGATIVE Final   Influenza A by PCR NEGATIVE NEGATIVE Final   Influenza B by PCR NEGATIVE NEGATIVE Final    Comment: (NOTE) The Xpert Xpress SARS-CoV-2/FLU/RSV plus assay is intended as an aid in the diagnosis of influenza from Nasopharyngeal swab specimens and should not be used as a sole basis for treatment. Nasal washings and aspirates are unacceptable for Xpert Xpress SARS-CoV-2/FLU/RSV testing.  Fact Sheet for Patients: BloggerCourse.com  Fact Sheet for Healthcare Providers: SeriousBroker.it  This test is not yet approved or cleared by the [place] FDA and has been authorized for detection and/or diagnosis of SARS-CoV-2 by FDA under an Emergency Use Authorization (EUA). This EUA will remain in effect (meaning this test can be used) for the duration of the COVID-19 declaration under Section 564(b)(1) of the Act, 21 U.S.C. section 360bbb-3(b)(1), unless the authorization is terminated or revoked.  Resp Syncytial Virus by PCR NEGATIVE NEGATIVE Final    Comment: (NOTE) Fact Sheet for Patients: BloggerCourse.com  Fact Sheet for Healthcare Providers: SeriousBroker.it  This test is not yet approved or cleared by the [place] FDA and has been authorized for detection and/or diagnosis of SARS-CoV-2 by FDA under an Emergency Use Authorization (EUA). This EUA will remain in effect (meaning this test can be used) for the duration of the COVID-19 declaration under Section 564(b)(1) of the Act, 21 U.S.C. section 360bbb-3(b)(1), unless the authorization is terminated or revoked.  Performed at [facility], [address], [place], [place] [address]     Radiology Studies: No results found.  Scheduled Meds:  carbidopa-levodopa  1 tablet Oral TID   donepezil  23 mg Oral QHS   enoxaparin (LOVENOX) injection  40 mg Subcutaneous Q24H   memantine  10 mg Oral BID   multivitamin with minerals  1 tablet Oral Daily   omega-3 acid ethyl esters  1 capsule Oral BID   Continuous Infusions:  lactated ringers 1,000 mL with potassium chloride 10 mEq infusion 50 mL/hr at 04/23/23 0637     LOS: 0 days   35 minutes with more than 50% spent in reviewing records, counseling patient/family and coordinating care.  Tobey Grim, MD [facility] [email or web address] 04/23/2023, 5:05 PM

## 2023-04-23 NOTE — Evaluation (Signed)
 Occupational Therapy Evaluation Patient Details Name: Katie Carrillo MRN: [id number] DOB: 1940-07-19 Today's Date: 04/23/2023   History of Present Illness   Katie MCELVEEN is a 83 y.o. female presents with complaints of 2 days of profuse diarrhea, associated with left lower quadrant abdominal pain, intractable nausea, and vomiting.   PMH: Parkinson's disease, essential hypertension, history of breast cancer and colon cancer     Clinical Impressions Patient reports living with husband (whom is currently  at [facility]) and is Independent with ADLs and IADLs and uses no AD for functional mobility. Patient currently demo's deficits in strength, safety, ADLs and functional mobility with increased risk of falling.  Patient currently requires minA  sit to stand and CGA with RW for transfer to [facility] from bedside chair along with CGA for LB ADLs and toileting task.  Patient would benefit from additional OT intervention of inpatient rehab program > 3 hours a day to address functional deficits listed above in order for patient to return to PLOF.     If plan is discharge home, recommend the following:   A little help with bathing/dressing/bathroom;Assistance with cooking/housework;Assist for transportation;Help with stairs or ramp for entrance     Functional Status Assessment   Patient has had a recent decline in their functional status and demonstrates the ability to make significant improvements in function in a reasonable and predictable amount of time.     Equipment Recommendations   None recommended by OT     Recommendations for Other Services         Precautions/Restrictions   Precautions Precautions: Fall Precaution/Restrictions Comments: pt with parkinsonian tremors Restrictions Weight Bearing Restrictions Per Provider Order: No     Mobility Bed Mobility Overal bed mobility:  (patient sitting in bedside chair upoin arrival into room)                   Transfers Overall transfer level: Needs assistance Equipment used: Rolling walker (2 wheels) Transfers: Sit to/from Stand Sit to Stand: Min assist                  Balance Overall balance assessment: Needs assistance Sitting-balance support: Feet supported, No upper extremity supported       Standing balance support: Bilateral upper extremity supported, During functional activity, Reliant on assistive device for balance                               ADL either performed or assessed with clinical judgement   ADL Overall ADL's : Needs assistance/impaired Eating/Feeding: Set up;Sitting   Grooming: Wash/dry hands;Sitting;Set up   Upper Body Bathing: Set up;Sitting   Lower Body Bathing: Contact guard assist;Sit to/from stand   Upper Body Dressing : Set up;Sitting   Lower Body Dressing: Contact guard assist;Sit to/from stand   Toilet Transfer: Contact guard assist;Cueing for safety;BSC/3in1   Toileting- Clothing Manipulation and Hygiene: Contact guard assist       Functional mobility during ADLs: Contact guard assist;Rolling walker (2 wheels)       Vision Baseline Vision/History: 1 Wears glasses Ability to See in Adequate Light: 0 Adequate Patient Visual Report: No change from baseline Vision Assessment?: No apparent visual deficits     Perception         Praxis         Pertinent Vitals/Pain Pain Assessment Pain Assessment: No/denies pain     Extremity/Trunk Assessment Upper Extremity Assessment Upper Extremity Assessment: Generalized  weakness   Lower Extremity Assessment Lower Extremity Assessment: Defer to PT evaluation   Cervical / Trunk Assessment Cervical / Trunk Assessment: Kyphotic   Communication Communication Communication: No apparent difficulties   Cognition Arousal: Alert Behavior During Therapy: WFL for tasks assessed/performed Cognition: No apparent impairments                                Following commands: Intact       Cueing  General Comments   Cueing Techniques: Verbal cues  VSS   Exercises     Shoulder Instructions      Home Living Family/patient expects to be discharged to:: Private residence Living Arrangements: Spouse/significant other Available Help at Discharge: Family;Available PRN/intermittently Type of Home: House Home Access: Ramped entrance     Home Layout: One level     Bathroom Shower/Tub: Producer, television/film/video: Handicapped height Bathroom Accessibility: Yes How Accessible: Accessible via walker Home Equipment: Rolling Walker (2 wheels);Shower seat          Prior Functioning/Environment Prior Level of Function : Independent/Modified Independent             Mobility Comments: drove, no AD ADLs Comments: indep    OT Problem List: Decreased strength;Decreased activity tolerance;Decreased safety awareness   OT Treatment/Interventions: Self-care/ADL training;Therapeutic exercise;Energy conservation;DME and/or AE instruction;Patient/family education      OT Goals(Current goals can be found in the care plan section)   Acute Rehab OT Goals OT Goal Formulation: With patient Time For Goal Achievement: 05/07/23 Potential to Achieve Goals: Good   OT Frequency:  Min 1X/week    Co-evaluation              AM-PAC OT "6 Clicks" Daily Activity     Outcome Measure Help from another person eating meals?: A Little Help from another person taking care of personal grooming?: A Little Help from another person toileting, which includes using toliet, bedpan, or urinal?: A Little Help from another person bathing (including washing, rinsing, drying)?: A Little Help from another person to put on and taking off regular upper body clothing?: None Help from another person to put on and taking off regular lower body clothing?: A Little 6 Click Score: 19   End of Session Equipment Utilized During Treatment: Rolling walker (2  wheels) Nurse Communication: Mobility status  Activity Tolerance: Patient tolerated treatment well Patient left: in chair;with call bell/phone within reach;with chair alarm set  OT Visit Diagnosis: Unsteadiness on feet (R26.81);Muscle weakness (generalized) (M62.81)                Time: 5409-8119 OT Time Calculation (min): 26 min Charges:  OT General Charges $OT Visit: 1 Visit OT Evaluation $OT Eval Moderate Complexity: 1 Mod OT Treatments $Self Care/Home Management : 8-22 mins  Governor Specking OT/L  Denice Paradise 04/23/2023, 3:29 PM

## 2023-04-23 NOTE — Progress Notes (Signed)
 Pts lunch has been ordered. Will be arriving within the next 40 minutes

## 2023-04-23 NOTE — Evaluation (Signed)
 Physical Therapy Evaluation Patient Details Name: Katie Carrillo MRN: [id number] DOB: [birth year] Today's Date: 04/23/2023  History of Present Illness  Katie Carrillo is a 83 y.o. female presents with complaints of 2 days of profuse diarrhea, associated with left lower quadrant abdominal pain, intractable nausea, and vomiting.   PMH: Parkinson's disease, essential hypertension, history of breast cancer and colon cancer   Clinical Impression  Pt admitted with above. PTA pt living alone as her husband is currently in rehab s/p BKA. PTA pt was indep without AD, drove, and did IADLs. Currently pt requiring minA for bed mobility, modA for sit to stand and ambulation with RW. Pt unsafe to return home alone at this time as she requires more assist and is at high fall risk due to deconditioning from illness in conjunction with rigidity and tremors from pre-existing Parkinsons. Pt would need 24/7 assist for asfe return home at this time. Recommending inpatient rehab program > 3 hrs a day to achieve safe mod I level of function prior to return home alone.        If plan is discharge home, recommend the following: A little help with walking and/or transfers;A little help with bathing/dressing/bathroom;Assist for transportation;Help with stairs or ramp for entrance   Can travel by private vehicle   Yes    Equipment Recommendations None recommended by PT (TBD at next venue)  Recommendations for Other Services       Functional Status Assessment Patient has had a recent decline in their functional status and demonstrates the ability to make significant improvements in function in a reasonable and predictable amount of time.     Precautions / Restrictions Precautions Precautions: Fall Precaution/Restrictions Comments: pt with parkinsonian tremors Restrictions Weight Bearing Restrictions Per Provider Order: No      Mobility  Bed Mobility Overal bed mobility: Needs Assistance Bed Mobility:  Rolling, Sidelying to Sit Rolling: Min assist Sidelying to sit: Min assist       General bed mobility comments: pt requested assist of PT, increased time, minA to achieve full sidelying, minA for trunk elevation    Transfers Overall transfer level: Needs assistance Equipment used: Rolling walker (2 wheels) Transfers: Sit to/from Stand Sit to Stand: Mod assist           General transfer comment: verbal cues for hand placement, modA for power up, completed 4 sit to stands, pt with posterior bias requiring tactile cues from PT to maintain anterior weightshift, pt with dizziness upon first stand but ceased s/p sitting and didn't experience dizziness upon the next 3 stands    Ambulation/Gait Ambulation/Gait assistance: Min assist Gait Distance (Feet): 10 Feet (from one side of the bed to the other) Assistive device: Rolling walker (2 wheels) Gait Pattern/deviations: Festinating Gait velocity: quick shuffles but overall decresaed Gait velocity interpretation: <1.31 ft/sec, indicative of household ambulator   General Gait Details: pt with shuffling, parkinsonian gait pattern, pt with increased trunk flexion, minA for walker management to maintain pt proximity to walker, minA to prevent anterior fall forward  Stairs            Wheelchair Mobility     Tilt Bed    Modified Rankin (Stroke Patients Only)       Balance Overall balance assessment: Needs assistance Sitting-balance support: Feet supported, No upper extremity supported Sitting balance-Leahy Scale: Fair     Standing balance support: Bilateral upper extremity supported, During functional activity, Reliant on assistive device for balance Standing balance-Leahy Scale: Poor Standing balance comment:  reliant on RW this date                             Pertinent Vitals/Pain Pain Assessment Pain Assessment: No/denies pain    Home Living Family/patient expects to be discharged to:: Private  residence Living Arrangements: Spouse/significant other (however currently alone as spouse is in rehab center due to recent BKA) Available Help at Discharge: Family;Available PRN/intermittently Type of Home: House Home Access: Ramped entrance       Home Layout: One level Home Equipment: Agricultural consultant (2 wheels);Shower seat      Prior Function Prior Level of Function : Independent/Modified Independent             Mobility Comments: drove, no AD ADLs Comments: indep     Extremity/Trunk Assessment   Upper Extremity Assessment Upper Extremity Assessment: Generalized weakness    Lower Extremity Assessment Lower Extremity Assessment: Generalized weakness    Cervical / Trunk Assessment Cervical / Trunk Assessment: Kyphotic  Communication   Communication Communication: No apparent difficulties    Cognition Arousal: Alert Behavior During Therapy: WFL for tasks assessed/performed   PT - Cognitive impairments: No family/caregiver present to determine baseline                       PT - Cognition Comments: pt A&Ox4, aware of deficits and weakness from being ill Following commands: Intact       Cueing Cueing Techniques: Verbal cues     General Comments General comments (skin integrity, edema, etc.): VSS    Exercises     Assessment/Plan    PT Assessment Patient needs continued PT services  PT Problem List Decreased strength;Decreased activity tolerance;Decreased balance;Decreased range of motion;Decreased mobility       PT Treatment Interventions DME instruction;Gait training;Stair training;Functional mobility training;Therapeutic activities;Therapeutic exercise;Balance training    PT Goals (Current goals can be found in the Care Plan section)  Acute Rehab PT Goals Patient Stated Goal: home PT Goal Formulation: With patient Time For Goal Achievement: 05/07/23 Potential to Achieve Goals: Good    Frequency Min 2X/week     Co-evaluation                AM-PAC PT "6 Clicks" Mobility  Outcome Measure Help needed turning from your back to your side while in a flat bed without using bedrails?: A Little Help needed moving from lying on your back to sitting on the side of a flat bed without using bedrails?: A Little Help needed moving to and from a bed to a chair (including a wheelchair)?: A Lot Help needed standing up from a chair using your arms (e.g., wheelchair or bedside chair)?: A Lot Help needed to walk in hospital room?: A Lot Help needed climbing 3-5 steps with a railing? : A Lot 6 Click Score: 14    End of Session Equipment Utilized During Treatment: Gait belt Activity Tolerance: Patient tolerated treatment well Patient left: in chair;with call bell/phone within reach;with chair alarm set Nurse Communication: Mobility status (and pt without purwick) PT Visit Diagnosis: Unsteadiness on feet (R26.81);Muscle weakness (generalized) (M62.81)    Time: 1610-9604 PT Time Calculation (min) (ACUTE ONLY): 27 min   Charges:   PT Evaluation $PT Eval Low Complexity: 1 Low PT Treatments $Gait Training: 8-22 mins PT General Charges $$ ACUTE PT VISIT: 1 Visit         Lewis Shock, PT, DPT Acute Rehabilitation Services Secure chat preferred  Office #: [phone]   Iona Hansen 04/23/2023, 2:23 PM

## 2023-04-23 NOTE — Plan of Care (Signed)

## 2023-04-24 DIAGNOSIS — R112 Nausea with vomiting, unspecified: Secondary | ICD-10-CM | POA: Diagnosis not present

## 2023-04-24 LAB — CBC
HCT: 37.3 % (ref 36.0–46.0)
Hemoglobin: 12 g/dL (ref 12.0–15.0)
MCH: 29.9 pg (ref 26.0–34.0)
MCHC: 32.2 g/dL (ref 30.0–36.0)
MCV: 93 fL (ref 80.0–100.0)
Platelets: 154 10*3/uL (ref 150–400)
RBC: 4.01 MIL/uL (ref 3.87–5.11)
RDW: 14.1 % (ref 11.5–15.5)
WBC: 5.9 10*3/uL (ref 4.0–10.5)
nRBC: 0 % (ref 0.0–0.2)

## 2023-04-24 LAB — BASIC METABOLIC PANEL
Anion gap: 9 (ref 5–15)
BUN: 6 mg/dL — ABNORMAL LOW (ref 8–23)
CO2: 27 mmol/L (ref 22–32)
Calcium: 8.4 mg/dL — ABNORMAL LOW (ref 8.9–10.3)
Chloride: 106 mmol/L (ref 98–111)
Creatinine, Ser: 0.66 mg/dL (ref 0.44–1.00)
GFR, Estimated: >60 mL/min (ref >60)
Glucose, Bld: 105 mg/dL — ABNORMAL HIGH (ref 70–99)
Potassium: 3.4 mmol/L — ABNORMAL LOW (ref 3.5–5.1)
Sodium: 142 mmol/L (ref 135–145)

## 2023-04-24 MED ORDER — POTASSIUM CHLORIDE CRYS ER 20 MEQ PO TBCR
40.0000 meq | EXTENDED_RELEASE_TABLET | Freq: Once | ORAL | Status: AC
  Administered 2023-04-24: 40 meq via ORAL
  Filled 2023-04-24: qty 2

## 2023-04-24 NOTE — TOC Initial Note (Signed)
 Transition of Care [facility]) - Initial/Assessment Note    Patient Details  Name: Katie Carrillo MRN: [id number] Date of Birth: [birth year]  Transition of Care [facility]) CM/SW Contact:    Helene Kelp, LCSW Phone Number: 04/24/2023, 3:56 PM  Clinical Narrative:                 CSW followed-up disposition recommendations (SNF placement).  CSW completed initial TOC work-up/assessment as noted by the following below.   CSW spoke with the patient to review SNF referral process per clinical recommendations and assessed the pt's SNF preference.   The patient expressed to be close with her husband, where he is at currently getting rehab. (SNF: [facility] and [address]. [place] Admissions Coordinator)  The CSW contacted the patient's natural support Katie Carrillo and reviewed the clinical recommendations and assess SNF preference. The natural support expressed stated he was in a SNF [facility]) and would like for his wife to come there. CSW spoke with the bedside nurse at the facility and noted the husband expressed he wants his wife to come there.   CSW referral efforts to support the patient's disposition FL2:  PASRR: [id number] A SNF referrals:   TOC Disposition follow-up needs  Please provide the patient or natural support with bed-offer updates.  Please continue with SNF placement efforts. Please update the clinical team to SNF placement efforts:  No other needs identified by this Clinical research associate currently. Patient needs and current disposition to be followed by    Expected Discharge Plan: Skilled Nursing Facility Barriers to Discharge: Continued Medical Work up   Patient Goals and CMS Choice   Expected Discharge Plan and Services    Living arrangements for the past 2 months: Single Family Home                 Prior Living Arrangements/Services Living arrangements for the past 2 months: Single Family Home Lives with:: Self Patient language and need for interpreter reviewed::  No        Need for Family Participation in Patient Care: Yes (Comment) Care giver support system in place?: Yes (comment)   Criminal Activity/Legal Involvement Pertinent to Current Situation/Hospitalization: No - Comment as needed  Activities of Daily Living   ADL Screening (condition at time of admission) Independently performs ADLs?: Yes (appropriate for developmental age) Is the patient deaf or have difficulty hearing?: No Does the patient have difficulty seeing, even when wearing glasses/contacts?: No Does the patient have difficulty concentrating, remembering, or making decisions?: No  Permission Sought/Granted Permission sought to share information with : Case Manager, Magazine features editor Permission granted to share information with : Yes, Verbal Permission Granted        Permission granted to share info w Relationship: Spouse: Katie Carrillo / [phone]     Emotional Assessment       Orientation: : Oriented to  Time, Oriented to Place, Oriented to Self Alcohol / Substance Use: Not Applicable Psych Involvement: No (comment)  Admission diagnosis:  Nausea vomiting and diarrhea [R11.2, R19.7] Intractable nausea and vomiting [R11.2] Patient Active Problem List   Diagnosis Date Noted   Intractable nausea and vomiting 04/22/2023   Acute respiratory failure with hypoxia (HCC) 05/23/2021   CAP (community acquired pneumonia) 05/22/2021   Essential hypertension 05/22/2021   Diverticulitis 01/26/2019   Dementia (HCC) 01/26/2019   Parkinson's disease (HCC) 01/26/2019   Acute diverticulitis 01/26/2019   Dense breast tissue 03/02/2014   DCIS (ductal carcinoma in situ) of breast 09/30/2011   Osteoarthritis  of right knee 07/02/2011   PCP:  Daisy Floro, MD Pharmacy:   [facility] [phone] - [place], [place] - [address] [place] [address] Phone: [phone] Fax: [phone]     Social Drivers of Health  (SDOH) Social History: SDOH Screenings   Food Insecurity: No Food Insecurity (04/22/2023)  Housing: Low Risk  (04/22/2023)  Transportation Needs: No Transportation Needs (04/22/2023)  Utilities: Not At Risk (04/22/2023)  Alcohol Screen: Low Risk  (06/16/2021)  Depression (PHQ2-9): Low Risk  (06/16/2021)  Financial Resource Strain: High Risk (06/16/2021)  Physical Activity: Insufficiently Active (06/16/2021)  Social Connections: Socially Integrated (04/22/2023)  Stress: No Stress Concern Present (06/16/2021)  Tobacco Use: Low Risk  (04/23/2023)   SDOH Interventions:     Readmission Risk Interventions     No data to display

## 2023-04-24 NOTE — Plan of Care (Signed)

## 2023-04-24 NOTE — Progress Notes (Addendum)
 Occupational Therapy Treatment Patient Details Name: Katie Carrillo MRN: [id number] DOB: 10/06/1940 Today's Date: 04/24/2023   History of present illness Katie Carrillo is a 83 y.o. female presents with complaints of 2 days of profuse diarrhea, associated with left lower quadrant abdominal pain, intractable nausea, and vomiting.   PMH: Parkinson's disease, essential hypertension, history of breast cancer and colon cancer   OT comments  Pt. Seen for skilled OT treatment session.  MIN A/MOD A for bed mobility to initiate roll and sidelying to sit.  Heavy MOD A for sit/stand with tactile support to correct post. lean that is a high fall risk.  CGA-MIN A during ambulation, toileting transfers, and peri care.  Current recommendation for HHOT but if pt. Cont. To require this level of physical assistance may need to consider continued therapy <3 hrs a day or confirmed physical assistance available at home 24/7.        If plan is discharge home, recommend the following:  A little help with bathing/dressing/bathroom;Assistance with cooking/housework;Assist for transportation;Help with stairs or ramp for entrance   Equipment Recommendations  None recommended by OT    Recommendations for Other Services      Precautions / Restrictions Precautions Precautions: Fall Precaution/Restrictions Comments: pt with parkinsonian tremors       Mobility Bed Mobility Overal bed mobility: Needs Assistance Bed Mobility: Rolling, Sidelying to Sit Rolling: Min assist Sidelying to sit: Mod assist       General bed mobility comments: exits on L side at home.  pt requested assist of COTA/L "im gonna need some help", increased time, minA to achieve full sidelying, mod A for trunk elevation. Pt. Describes pulling on headboard at home, allowed use of rails and pt. Was not able to use and achieve bed mobility without assistance     Transfers Overall transfer level: Needs assistance Equipment used: Rolling walker  (2 wheels) Transfers: Sit to/from Stand, Bed to chair/wheelchair/BSC Sit to Stand: Min assist, Mod assist     Step pivot transfers: Contact guard assist, Min assist     General transfer comment: cues for hand placment, intial heavy MOD A sit/stand from eob. less assist from toilet but pt. used grab bar to pull up.  tactile cues for correction of post. lean     Balance                                           ADL either performed or assessed with clinical judgement   ADL Overall ADL's : Needs assistance/impaired               Lower Body Bathing Details (indicate cue type and reason): unable to figure 4 on both LEs, states grandsons g.friend is available to help with LB adls, A/E could be beneficial also       Lower Body Dressing Details (indicate cue type and reason): unable to figure 4 on both LEs, states grandsons g.friend is available to help with LB adls, A/E could be beneficial also Toilet Transfer: Contact guard assist;Minimal assistance;Rolling walker (2 wheels);Grab bars;Regular Toilet;Cueing for sequencing;Cueing for safety   Toileting- Clothing Manipulation and Hygiene: Contact guard assist;Minimal assistance;Sit to/from stand Toileting - Clothing Manipulation Details (indicate cue type and reason): pt. states she stands at home for pericare     Functional mobility during ADLs: Contact guard assist;Minimal assistance;Rolling walker (2 wheels)  Extremity/Trunk Assessment              Vision       Restaurant manager, fast food Communication: No apparent difficulties   Cognition Arousal: Alert Behavior During Therapy: WFL for tasks assessed/performed Cognition: No apparent impairments                               Following commands: Intact        Cueing   Cueing Techniques: Verbal cues  Exercises      Shoulder Instructions       General Comments      Pertinent Vitals/  Pain       Pain Assessment Pain Assessment: No/denies pain  Home Living                                          Prior Functioning/Environment              Frequency  Min 1X/week        Progress Toward Goals  OT Goals(current goals can now be found in the care plan section)  Progress towards OT goals: Progressing toward goals     Plan      Co-evaluation                 AM-PAC OT "6 Clicks" Daily Activity     Outcome Measure   Help from another person eating meals?: A Little Help from another person taking care of personal grooming?: A Little Help from another person toileting, which includes using toliet, bedpan, or urinal?: A Little Help from another person bathing (including washing, rinsing, drying)?: A Little Help from another person to put on and taking off regular upper body clothing?: None Help from another person to put on and taking off regular lower body clothing?: A Little 6 Click Score: 19    End of Session Equipment Utilized During Treatment: Rolling walker (2 wheels);Gait belt  OT Visit Diagnosis: Unsteadiness on feet (R26.81);Muscle weakness (generalized) (M62.81)   Activity Tolerance Patient tolerated treatment well   Patient Left in chair;with call bell/phone within reach;with chair alarm set   Nurse Communication Other (comment) (rn present at beginning of session, states ok to work with therapy today)        Time: 9562-1308 OT Time Calculation (min): 24 min  Charges: OT General Charges $OT Visit: 1 Visit OT Treatments $Self Care/Home Management : 23-37 mins  Boneta Lucks, COTA/L Acute Rehabilitation [phone]   Alessandra Bevels Lorraine-COTA/L 04/24/2023, 9:56 AM

## 2023-04-24 NOTE — Progress Notes (Signed)
 PROGRESS NOTE  Katie Carrillo  NWG:[id number] DOB: 1940-04-18 DOA: 04/22/2023 PCP: Daisy Floro, MD  Consultants  Brief Narrative: 83 y.o. female with medical history significant for Parkinson's disease, essential hypertension, history of breast cancer and colon cancer presents with complaints of 2 days of profuse diarrhea, associated with left lower quadrant abdominal pain, intractable nausea, and vomiting.  Denies recent use of antibiotics.  Denies known sick contacts.  No reported subjective fevers or chills.  No cardiopulmonary symptoms.  Has not been able to tolerate oral intake.  Also admits to generalized weakness to the point where she is unable to ambulate without assistance since yesterday.  She was brought into the ER for further evaluation.  Admitted to the hospitalist service due to ongoing weakness.   Assessment & Plan: Intractable nausea and vomiting, suspect viral gastroenteritis - continues to improve, no further nausea, vomiting, diarrhea. - now on just oral rehydration.  -She does report that her husband lives in a nursing home and she has been visiting him frequently including today prior to admission. -Sounds like it is most likely she picked up norovirus or some other GI pathogen. -Regardless she is steadily improving.  Advance diet as tolerated.    Acute diarrhea, rule out active infective process CT abdomen pelvis was unrevealing Negative pathogen panels No further bowel movements since admission. Can DC enteric precautions   Nitrite positive UA - doing well with no abx.     Parkinson's disease Resume home regimen.  Does have notable tremor at baseline. Fall precautions   Hypertension Antihypertensive held on admission. -Blood pressure is slowly creeping upwards and we will continue to watch off blood pressure medicines.  Blood pressure enters hypertensive range plan will be to restart home medications.   Generalized weakness/physical debility PT OT  assessment Fall precautions   Hypokalemia Serum potassium 3.4, replaced  -Mag normal.  -  Likely secondary to ongoing gastrointestinal losses.  Anemia:  - Noted incidentally yesterday's labs, resolved today       DVT prophylaxis:  enoxaparin (LOVENOX) injection 40 mg Start: 04/23/23 1000  Code Status:   Code Status: Full Code Level of care: Telemetry Medical Status is: inpatient Dispo:  PT/OT recommending SNF.  TOC consulted.  Patient agreeable.     Subjective: Patient feels better today than she has been.  Weakness better.  Still without N/V/D.  Would like to go to SNF with husband.   Objective: Vitals:   04/23/23 1148 04/23/23 1635 04/23/23 2024 04/24/23 0340  BP: 139/69 (!) 123/59 (!) 131/59 (!) 127/57  Pulse: 74 (!) 54 61 65  Resp: 18 18 18 16   Temp: 97.9 F (36.6 C) 97.9 F (36.6 C) 98.4 F (36.9 C) 98.2 F (36.8 C)  TempSrc: Oral Oral Oral Oral  SpO2: 98% 98% 97% 97%  Weight:      Height:        Intake/Output Summary (Last 24 hours) at 04/24/2023 0814 Last data filed at 04/24/2023 0305 Gross per 24 hour  Intake --  Output 1050 ml  Net -1050 ml   Filed Weights   04/22/23 0902  Weight: 86.2 kg   Body mass index is 28.89 kg/m.  Gen: 83 y.o. female in no apparent distress.  Nontoxic Pulm: Non-labored breathing.  Clear to auscultation bilaterally.  CV: Regular rate and rhythm. No murmur, rub, or gallop. No JVD GI: Abdomen soft, nondistended, minimally tender throughout without guarding or rebound. Ext: Warm, no deformities, no pedal edema Skin: No rashes, lesions no ulcers  Neuro: Alert and oriented. No focal neurological deficits.  Does have resting pill-rolling tremor bilateral hands. Psych: Calm  Judgement and insight appear normal. Mood & affect appropriate.     I have personally reviewed the following labs and images: CBC: Recent Labs  Lab 04/22/23 0914 04/23/23 0644 04/24/23 0717  WBC 6.9 4.6 5.9  HGB 12.5 11.0* 12.0  HCT 39.0 35.2* 37.3   MCV 94.0 95.4 93.0  PLT 195 143* 154   BMP &GFR Recent Labs  Lab 04/22/23 0914 04/23/23 0644  NA 139 141  K 3.3* 3.3*  CL 105 109  CO2 24 24  GLUCOSE 118* 84  BUN 22 14  CREATININE 0.87 0.64  CALCIUM 8.7* 8.4*  MG 2.0 2.0  PHOS  --  3.4   Estimated Creatinine Clearance: 62.3 mL/min (by C-G formula based on SCr of 0.64 mg/dL). Liver & Pancreas: Recent Labs  Lab 04/22/23 0914  AST 21  ALT 12  ALKPHOS 41  BILITOT 0.5  PROT 6.3*  ALBUMIN 3.6   Recent Labs  Lab 04/22/23 0914  LIPASE 25   No results for input(s): "AMMONIA" in the last 168 hours. Diabetic: No results for input(s): "HGBA1C" in the last 72 hours. Recent Labs  Lab 04/22/23 1225  GLUCAP 86   Cardiac Enzymes: No results for input(s): "CKTOTAL", "CKMB", "CKMBINDEX", "TROPONINI" in the last 168 hours. No results for input(s): "PROBNP" in the last 8760 hours. Coagulation Profile: No results for input(s): "INR", "PROTIME" in the last 168 hours. Thyroid Function Tests: No results for input(s): "TSH", "T4TOTAL", "FREET4", "T3FREE", "THYROIDAB" in the last 72 hours. Lipid Profile: No results for input(s): "CHOL", "HDL", "LDLCALC", "TRIG", "CHOLHDL", "LDLDIRECT" in the last 72 hours. Anemia Panel: No results for input(s): "VITAMINB12", "FOLATE", "FERRITIN", "TIBC", "IRON", "RETICCTPCT" in the last 72 hours. Urine analysis:    Component Value Date/Time   COLORURINE YELLOW 04/22/2023 1700   APPEARANCEUR CLEAR 04/22/2023 1700   APPEARANCEUR Clear 05/21/2020 1615   LABSPEC 1.010 04/22/2023 1700   PHURINE 6.0 04/22/2023 1700   GLUCOSEU NEGATIVE 04/22/2023 1700   HGBUR MODERATE (A) 04/22/2023 1700   BILIRUBINUR NEGATIVE 04/22/2023 1700   BILIRUBINUR Negative 05/21/2020 1615   KETONESUR NEGATIVE 04/22/2023 1700   PROTEINUR NEGATIVE 04/22/2023 1700   UROBILINOGEN 0.2 06/18/2011 1036   NITRITE POSITIVE (A) 04/22/2023 1700   LEUKOCYTESUR NEGATIVE 04/22/2023 1700   Sepsis Labs: Invalid input(s):  "PROCALCITONIN", "LACTICIDVEN"  Microbiology: Recent Results (from the past 240 hours)  Resp panel by RT-PCR (RSV, Flu A&B, Covid) Anterior Nasal Swab     Status: None   Collection Time: 04/22/23  9:23 AM   Specimen: Anterior Nasal Swab  Result Value Ref Range Status   SARS Coronavirus 2 by RT PCR NEGATIVE NEGATIVE Final   Influenza A by PCR NEGATIVE NEGATIVE Final   Influenza B by PCR NEGATIVE NEGATIVE Final    Comment: (NOTE) The Xpert Xpress SARS-CoV-2/FLU/RSV plus assay is intended as an aid in the diagnosis of influenza from Nasopharyngeal swab specimens and should not be used as a sole basis for treatment. Nasal washings and aspirates are unacceptable for Xpert Xpress SARS-CoV-2/FLU/RSV testing.  Fact Sheet for Patients: BloggerCourse.com  Fact Sheet for Healthcare Providers: SeriousBroker.it  This test is not yet approved or cleared by the [place] FDA and has been authorized for detection and/or diagnosis of SARS-CoV-2 by FDA under an Emergency Use Authorization (EUA). This EUA will remain in effect (meaning this test can be used) for the duration of the COVID-19 declaration under  Section 564(b)(1) of the Act, 21 U.S.C. section 360bbb-3(b)(1), unless the authorization is terminated or revoked.     Resp Syncytial Virus by PCR NEGATIVE NEGATIVE Final    Comment: (NOTE) Fact Sheet for Patients: BloggerCourse.com  Fact Sheet for Healthcare Providers: SeriousBroker.it  This test is not yet approved or cleared by the [place] FDA and has been authorized for detection and/or diagnosis of SARS-CoV-2 by FDA under an Emergency Use Authorization (EUA). This EUA will remain in effect (meaning this test can be used) for the duration of the COVID-19 declaration under Section 564(b)(1) of the Act, 21 U.S.C. section 360bbb-3(b)(1), unless the authorization is terminated  or revoked.  Performed at [facility], [address] [place], [place] [address]     Radiology Studies: No results found.  Scheduled Meds:  carbidopa-levodopa  1 tablet Oral TID   donepezil  23 mg Oral QHS   enoxaparin (LOVENOX) injection  40 mg Subcutaneous Q24H   memantine  10 mg Oral BID   multivitamin with minerals  1 tablet Oral Daily   omega-3 acid ethyl esters  1 capsule Oral BID   Continuous Infusions:     LOS: 0 days   35 minutes with more than 50% spent in reviewing records, counseling patient/family and coordinating care.  Tobey Grim, MD [facility] [email or web address] 04/24/2023, 8:14 AM

## 2023-04-24 NOTE — Plan of Care (Signed)

## 2023-04-24 NOTE — NC FL2 (Signed)
 [place] MEDICAID FL2 LEVEL OF CARE FORM     IDENTIFICATION  Patient Name: Katie Carrillo Birthdate: Apr 28, 1940 Sex: female Admission Date (Current Location): 04/22/2023  [facility] and [place] Number:      Facility and Address:  [facility], [address] [place], [place] [address]      Provider Number: [id number]  Attending Physician Name and Address:  Tobey Grim, MD  Relative Name and Phone Number:  Spouse; Chalisa Kobler [phone]    Current Level of Care: Hospital Recommended Level of Care: Skilled Nursing Facility Prior Approval Number:    Date Approved/Denied: 04/24/23 PASRR Number: [id number]  Discharge Plan: SNF    Current Diagnoses: Patient Active Problem List   Diagnosis Date Noted   Intractable nausea and vomiting 04/22/2023   Acute respiratory failure with hypoxia (HCC) 05/23/2021   CAP (community acquired pneumonia) 05/22/2021   Essential hypertension 05/22/2021   Diverticulitis 01/26/2019   Dementia (HCC) 01/26/2019   Parkinson's disease (HCC) 01/26/2019   Acute diverticulitis 01/26/2019   Dense breast tissue 03/02/2014   DCIS (ductal carcinoma in situ) of breast 09/30/2011   Osteoarthritis of right knee 07/02/2011    Orientation RESPIRATION BLADDER Height & Weight     Time, Place, Self, Situation  Normal Incontinent Weight: 190 lb (86.2 kg) Height:  5\' 8"  (172.7 cm)  BEHAVIORAL SYMPTOMS/MOOD NEUROLOGICAL BOWEL NUTRITION STATUS      Continent Diet  AMBULATORY STATUS COMMUNICATION OF NEEDS Skin   Limited Assist                           Personal Care Assistance Level of Assistance  Dressing, Bathing     Dressing Assistance: Limited assistance     Functional Limitations Info  Sight, Hearing Sight Info: Impaired Hearing Info: Impaired      SPECIAL CARE FACTORS FREQUENCY  PT (By licensed PT), OT (By licensed OT)     PT Frequency: 5x OT Frequency: 3x            Contractures  Contractures Info: Not present    Additional Factors Info  Code Status Code Status Info: full             Current Medications (04/24/2023):  This is the current hospital active medication list Current Facility-Administered Medications  Medication Dose Route Frequency Provider Last Rate Last Admin   acetaminophen (TYLENOL) tablet 650 mg  650 mg Oral Q6H PRN Dow Adolph N, DO   650 mg at 04/24/23 0300   carbidopa-levodopa (SINEMET IR) 25-100 MG per tablet immediate release 1 tablet  1 tablet Oral TID Dow Adolph N, DO   1 tablet at 04/24/23 1503   donepezil (ARICEPT) tablet 23 mg  23 mg Oral QHS Dow Adolph N, DO   23 mg at 04/23/23 2158   enoxaparin (LOVENOX) injection 40 mg  40 mg Subcutaneous Q24H Dow Adolph N, DO   40 mg at 04/24/23 8469   melatonin tablet 5 mg  5 mg Oral QHS PRN Dow Adolph N, DO       memantine (NAMENDA) tablet 10 mg  10 mg Oral BID Dow Adolph N, DO   10 mg at 04/24/23 6295   multivitamin with minerals tablet 1 tablet  1 tablet Oral Daily Darlin Drop, DO   1 tablet at 04/24/23 2841   omega-3 acid ethyl esters (LOVAZA) capsule 1 g  1 capsule Oral BID Dow Adolph N, DO   1 g  at 04/24/23 4540   prochlorperazine (COMPAZINE) injection 5 mg  5 mg Intravenous Q6H PRN Darlin Drop, DO         Discharge Medications: Please see discharge summary for a list of discharge medications.  Relevant Imaging Results:  Relevant Lab Results:   Additional Information SS#: [id number]  Helene Kelp, LCSW

## 2023-04-24 NOTE — Care Management Obs Status (Signed)
 MEDICARE OBSERVATION STATUS NOTIFICATION   Patient Details  Name: Katie Carrillo MRN: [id number] Date of Birth: 05/24/1940   Medicare Observation Status Notification Given:  Yes    Isaias Cowman, RN 04/24/2023, 8:32 AM

## 2023-04-25 DIAGNOSIS — R112 Nausea with vomiting, unspecified: Secondary | ICD-10-CM | POA: Diagnosis not present

## 2023-04-25 LAB — BASIC METABOLIC PANEL
Anion gap: 12 (ref 5–15)
BUN: 14 mg/dL (ref 8–23)
CO2: 23 mmol/L (ref 22–32)
Calcium: 8.7 mg/dL — ABNORMAL LOW (ref 8.9–10.3)
Chloride: 104 mmol/L (ref 98–111)
Creatinine, Ser: 0.61 mg/dL (ref 0.44–1.00)
GFR, Estimated: >60 mL/min (ref >60)
Glucose, Bld: 94 mg/dL (ref 70–99)
Potassium: 4 mmol/L (ref 3.5–5.1)
Sodium: 139 mmol/L (ref 135–145)

## 2023-04-25 MED ORDER — POLYETHYLENE GLYCOL 3350 17 G PO PACK: 17.0000 g | PACK | Freq: Every day | ORAL | Status: DC | PRN

## 2023-04-25 MED ORDER — BENZONATATE 100 MG PO CAPS
200.0000 mg | ORAL_CAPSULE | Freq: Three times a day (TID) | ORAL | Status: DC | PRN
  Administered 2023-04-25: 200 mg via ORAL
  Filled 2023-04-25: qty 2

## 2023-04-25 NOTE — Progress Notes (Signed)
 PROGRESS NOTE  [name]  WJX:[id number] DOB: Jul 08, 1940 DOA: 04/22/2023 PCP: Daisy Floro, MD  Consultants  Brief Narrative: 83 y.o. female with medical history significant for Parkinson's disease, essential hypertension, history of breast cancer and colon cancer presents with complaints of 2 days of profuse diarrhea, associated with left lower quadrant abdominal pain, intractable nausea, and vomiting.  Denies recent use of antibiotics.  Denies known sick contacts.  No reported subjective fevers or chills.  No cardiopulmonary symptoms.  Has not been able to tolerate oral intake.  Also admits to generalized weakness to the point where she is unable to ambulate without assistance since yesterday.  She was brought into the ER for further evaluation.  Admitted to the hospitalist service due to ongoing weakness.  No further episodes of nausea or vomiting since admission.  Weakness has improved and she is working with both physical and Occupational Therapy.  Stable for discharge and awaiting SNF placement.   Assessment & Plan: Intractable nausea and vomiting, suspect viral gastroenteritis - continues to improve, no further nausea, vomiting, diarrhea. - now on just oral rehydration.  -She does report that her husband lives in a nursing home and she has been visiting him frequently including today prior to admission. -Sounds like it is most likely she picked up norovirus or some other GI pathogen. -Regardless she is steadily improving.  She is tolerating regular diet.  Acute diarrhea, rule out active infective process CT abdomen pelvis was unrevealing Negative pathogen panels No further diarrhea since admission.  She did have episode of constipation and is on bowel regimen.   Nitrite positive UA - doing well with no abx.     Parkinson's disease Resume home regimen.  Does have notable tremor at baseline. Fall precautions   Hypertension Antihypertensive held on admission. -Blood  pressure is slowly creeping upwards and we will continue to watch off blood pressure medicines.  If blood pressure enters hypertensive range plan will be to restart home medications.   Generalized weakness/physical debility PT OT assessment Fall precautions   Hypokalemia Serum potassium 3.4, replaced   Anemia:  - Noted incidentally 3/22, normal 3/23       DVT prophylaxis:  enoxaparin (LOVENOX) injection 40 mg Start: 04/23/23 1000  Code Status:   Code Status: Full Code Level of care: Telemetry Medical Status is: inpatient Dispo:  PT/OT recommending SNF.  TOC consulted.  Patient agreeable.  Awaiting placement at same SNF where husband is located [facility])   Subjective: Patient feels better today than she has been.  Weakness slowly improving.  Still without N/V/D.  Would like to go to SNF with husband.   Objective: Vitals:   04/24/23 0851 04/24/23 1617 04/24/23 2024 04/25/23 0817  BP: 128/63 132/63 (!) 148/63 137/72  Pulse: 63 (!) 58 68 64  Resp: 17 17 16 17   Temp:   98.9 F (37.2 C)   TempSrc:   Oral   SpO2: 94% 95% 98% 95%  Weight:      Height:        Intake/Output Summary (Last 24 hours) at 04/25/2023 1557 Last data filed at 04/25/2023 0651 Gross per 24 hour  Intake 120 ml  Output 600 ml  Net -480 ml   Filed Weights   04/22/23 0902  Weight: 86.2 kg   Body mass index is 28.89 kg/m.  Gen: 83 y.o. female in no apparent distress.  Nontoxic Pulm: Non-labored breathing.  Clear to auscultation bilaterally.  CV: Regular rate and rhythm. No murmur, rub,  or gallop. No JVD GI: Abdomen soft, nondistended, minimally tender throughout without guarding or rebound. Ext: Warm, no deformities, no pedal edema Skin: No rashes, lesions no ulcers Neuro: Alert and oriented. No focal neurological deficits.  Does have resting pill-rolling tremor bilateral hands. Psych: Calm  Judgement and insight appear normal. Mood & affect appropriate.     I have personally reviewed the  following labs and images: CBC: Recent Labs  Lab 04/22/23 0914 04/23/23 0644 04/24/23 0717  WBC 6.9 4.6 5.9  HGB 12.5 11.0* 12.0  HCT 39.0 35.2* 37.3  MCV 94.0 95.4 93.0  PLT 195 143* 154   BMP &GFR Recent Labs  Lab 04/22/23 0914 04/23/23 0644 04/24/23 0717 04/25/23 0839  NA 139 141 142 139  K 3.3* 3.3* 3.4* 4.0  CL 105 109 106 104  CO2 24 24 27 23   GLUCOSE 118* 84 105* 94  BUN 22 14 6* 14  CREATININE 0.87 0.64 0.66 0.61  CALCIUM 8.7* 8.4* 8.4* 8.7*  MG 2.0 2.0  --   --   PHOS  --  3.4  --   --    Estimated Creatinine Clearance: 62.3 mL/min (by C-G formula based on SCr of 0.61 mg/dL). Liver & Pancreas: Recent Labs  Lab 04/22/23 0914  AST 21  ALT 12  ALKPHOS 41  BILITOT 0.5  PROT 6.3*  ALBUMIN 3.6   Recent Labs  Lab 04/22/23 0914  LIPASE 25   No results for input(s): "AMMONIA" in the last 168 hours. Diabetic: No results for input(s): "HGBA1C" in the last 72 hours. Recent Labs  Lab 04/22/23 1225  GLUCAP 86   Cardiac Enzymes: No results for input(s): "CKTOTAL", "CKMB", "CKMBINDEX", "TROPONINI" in the last 168 hours. No results for input(s): "PROBNP" in the last 8760 hours. Coagulation Profile: No results for input(s): "INR", "PROTIME" in the last 168 hours. Thyroid Function Tests: No results for input(s): "TSH", "T4TOTAL", "FREET4", "T3FREE", "THYROIDAB" in the last 72 hours. Lipid Profile: No results for input(s): "CHOL", "HDL", "LDLCALC", "TRIG", "CHOLHDL", "LDLDIRECT" in the last 72 hours. Anemia Panel: No results for input(s): "VITAMINB12", "FOLATE", "FERRITIN", "TIBC", "IRON", "RETICCTPCT" in the last 72 hours. Urine analysis:    Component Value Date/Time   COLORURINE YELLOW 04/22/2023 1700   APPEARANCEUR CLEAR 04/22/2023 1700   APPEARANCEUR Clear 05/21/2020 1615   LABSPEC 1.010 04/22/2023 1700   PHURINE 6.0 04/22/2023 1700   GLUCOSEU NEGATIVE 04/22/2023 1700   HGBUR MODERATE (A) 04/22/2023 1700   BILIRUBINUR NEGATIVE 04/22/2023 1700    BILIRUBINUR Negative 05/21/2020 1615   KETONESUR NEGATIVE 04/22/2023 1700   PROTEINUR NEGATIVE 04/22/2023 1700   UROBILINOGEN 0.2 06/18/2011 1036   NITRITE POSITIVE (A) 04/22/2023 1700   LEUKOCYTESUR NEGATIVE 04/22/2023 1700   Sepsis Labs: Invalid input(s): "PROCALCITONIN", "LACTICIDVEN"  Microbiology: Recent Results (from the past 240 hours)  Resp panel by RT-PCR (RSV, Flu A&B, Covid) Anterior Nasal Swab     Status: None   Collection Time: 04/22/23  9:23 AM   Specimen: Anterior Nasal Swab  Result Value Ref Range Status   SARS Coronavirus 2 by RT PCR NEGATIVE NEGATIVE Final   Influenza A by PCR NEGATIVE NEGATIVE Final   Influenza B by PCR NEGATIVE NEGATIVE Final    Comment: (NOTE) The Xpert Xpress SARS-CoV-2/FLU/RSV plus assay is intended as an aid in the diagnosis of influenza from Nasopharyngeal swab specimens and should not be used as a sole basis for treatment. Nasal washings and aspirates are unacceptable for Xpert Xpress SARS-CoV-2/FLU/RSV testing.  Fact Sheet for Patients:  BloggerCourse.com  Fact Sheet for Healthcare Providers: SeriousBroker.it  This test is not yet approved or cleared by the [place] FDA and has been authorized for detection and/or diagnosis of SARS-CoV-2 by FDA under an Emergency Use Authorization (EUA). This EUA will remain in effect (meaning this test can be used) for the duration of the COVID-19 declaration under Section 564(b)(1) of the Act, 21 U.S.C. section 360bbb-3(b)(1), unless the authorization is terminated or revoked.     Resp Syncytial Virus by PCR NEGATIVE NEGATIVE Final    Comment: (NOTE) Fact Sheet for Patients: BloggerCourse.com  Fact Sheet for Healthcare Providers: SeriousBroker.it  This test is not yet approved or cleared by the [place] FDA and has been authorized for detection and/or diagnosis of SARS-CoV-2 by FDA  under an Emergency Use Authorization (EUA). This EUA will remain in effect (meaning this test can be used) for the duration of the COVID-19 declaration under Section 564(b)(1) of the Act, 21 U.S.C. section 360bbb-3(b)(1), unless the authorization is terminated or revoked.  Performed at [facility], [address] [place], [place] [address]     Radiology Studies: No results found.  Scheduled Meds:  carbidopa-levodopa  1 tablet Oral TID   donepezil  23 mg Oral QHS   enoxaparin (LOVENOX) injection  40 mg Subcutaneous Q24H   memantine  10 mg Oral BID   multivitamin with minerals  1 tablet Oral Daily   omega-3 acid ethyl esters  1 capsule Oral BID   Continuous Infusions:    35 minutes with more than 50% spent in reviewing records, counseling patient/family and coordinating care.  Tobey Grim, MD [facility] [email or web address] 04/25/2023, 3:57 PM

## 2023-04-25 NOTE — Progress Notes (Signed)
 [facility] night cross cover note:  Prn Tessalon Perles added for cough.   Newton Pigg, DO Hospitalist

## 2023-04-25 NOTE — TOC Progression Note (Addendum)
 Transition of Care [facility]) - Progression Note    Patient Details  Name: Katie Carrillo MRN: [id number] Date of Birth: February 13, 1940  [facility]) CM/SW Contact  Erin Sons, [place] Phone Number: 04/25/2023, 1:22 PM  Clinical Narrative:     CSW called [facility] 3 times. Each time phone rang with no answer and no voicemail option.   Tried calling admissions liaison; left voicemail requesting return call.   1320: CSW called  [facility] main number again. Staff person answered and provided CSW with their admissions number, [phone].   CSW called admissions; left voicemail requesting return call.   1520: Called admissions again and spoke with Robby. He would need referral faxed to [email or web address]. CSW emailed referral. He states he can likely take pt though would need to review referral first.   SNF auth request initiated. Ref#  [id number] Auth is pending  Expected Discharge Plan: Skilled Nursing Facility Barriers to Discharge: Continued Medical Work up  Expected Discharge Plan and Services       Living arrangements for the past 2 months: Single Family Home                                       Social Determinants of Health (SDOH) Interventions SDOH Screenings   Food Insecurity: No Food Insecurity (04/22/2023)  Housing: Low Risk  (04/22/2023)  Transportation Needs: No Transportation Needs (04/22/2023)  Utilities: Not At Risk (04/22/2023)  Alcohol Screen: Low Risk  (06/16/2021)  Depression (PHQ2-9): Low Risk  (06/16/2021)  Financial Resource Strain: High Risk (06/16/2021)  Physical Activity: Insufficiently Active (06/16/2021)  Social Connections: Socially Integrated (04/22/2023)  Stress: No Stress Concern Present (06/16/2021)  Tobacco Use: Low Risk  (04/23/2023)    Readmission Risk Interventions     No data to display

## 2023-04-25 NOTE — Progress Notes (Signed)
 Physical Therapy Treatment Patient Details Name: Katie Carrillo MRN: [id number] DOB: 1940/03/15 Today's Date: 04/25/2023   History of Present Illness Katie Carrillo is a 83 y.o. female presents with complaints of 2 days of profuse diarrhea, associated with left lower quadrant abdominal pain, intractable nausea, and vomiting.   PMH: Parkinson's disease, essential hypertension, history of breast cancer and colon cancer    PT Comments  Pt making steady progress with mobility but unable to manage independently at home. Expect she will continue to make steady progress and will benefit from continued inpatient follow up therapy, <3 hours/day.    If plan is discharge home, recommend the following: A little help with walking and/or transfers;A little help with bathing/dressing/bathroom;Assist for transportation;Help with stairs or ramp for entrance   Can travel by private vehicle     Yes  Equipment Recommendations  None recommended by PT (TBD at next venue)    Recommendations for Other Services       Precautions / Restrictions Precautions Precautions: Fall Precaution/Restrictions Comments: pt with parkinsonian tremors Restrictions Weight Bearing Restrictions Per Provider Order: No     Mobility  Bed Mobility               General bed mobility comments: Pt up on BSC    Transfers Overall transfer level: Needs assistance Equipment used: Rolling walker (2 wheels) Transfers: Sit to/from Stand, Bed to chair/wheelchair/BSC Sit to Stand: Min assist   Step pivot transfers: Min assist       General transfer comment: Assist to power up and stabilize. BSC to chair with min assist for balance    Ambulation/Gait Ambulation/Gait assistance: Contact guard assist Gait Distance (Feet): 100 Feet Assistive device: Rolling walker (2 wheels) Gait Pattern/deviations: Step-through pattern, Decreased step length - right, Decreased step length - left, Decreased stride length Gait velocity:  decr Gait velocity interpretation: 1.31 - 2.62 ft/sec, indicative of limited community ambulator   General Gait Details: Assist for balance and safety   Stairs             Wheelchair Mobility     Tilt Bed    Modified Rankin (Stroke Patients Only)       Balance Overall balance assessment: Needs assistance Sitting-balance support: Feet supported, No upper extremity supported Sitting balance-Leahy Scale: Fair     Standing balance support: Bilateral upper extremity supported, During functional activity, Reliant on assistive device for balance Standing balance-Leahy Scale: Poor Standing balance comment: walker and CGA for static standing                            Communication Communication Communication: No apparent difficulties  Cognition Arousal: Alert Behavior During Therapy: WFL for tasks assessed/performed   PT - Cognitive impairments: No apparent impairments                         Following commands: Intact      Cueing Cueing Techniques: Verbal cues  Exercises      General Comments        Pertinent Vitals/Pain      Home Living                          Prior Function            PT Goals (current goals can now be found in the care plan section) Acute Rehab PT Goals Patient Stated Goal:  home Progress towards PT goals: Progressing toward goals    Frequency    Min 2X/week      PT Plan      Co-evaluation              AM-PAC PT "6 Clicks" Mobility   Outcome Measure  Help needed turning from your back to your side while in a flat bed without using bedrails?: A Little Help needed moving from lying on your back to sitting on the side of a flat bed without using bedrails?: A Little Help needed moving to and from a bed to a chair (including a wheelchair)?: A Little Help needed standing up from a chair using your arms (e.g., wheelchair or bedside chair)?: A Little Help needed to walk in hospital  room?: A Little Help needed climbing 3-5 steps with a railing? : A Lot 6 Click Score: 17    End of Session Equipment Utilized During Treatment: Gait belt Activity Tolerance: Patient tolerated treatment well Patient left: in chair;with call bell/phone within reach;with chair alarm set Nurse Communication: Mobility status PT Visit Diagnosis: Unsteadiness on feet (R26.81);Muscle weakness (generalized) (M62.81)     Time: 1660-6301 PT Time Calculation (min) (ACUTE ONLY): 24 min  Charges:    $Gait Training: 23-37 mins PT General Charges $$ ACUTE PT VISIT: 1 Visit                     [facility] PT Acute Rehabilitation Services Office [phone]    Angelina Ok [facility] 04/25/2023, 2:02 PM

## 2023-04-25 NOTE — Progress Notes (Addendum)
 Occupational Therapy Treatment Patient Details Name: Katie Carrillo MRN: [id number] DOB: Mar 04, 1940 Today's Date: 04/25/2023   History of present illness Katie Carrillo is a 83 y.o. female presents with complaints of 2 days of profuse diarrhea, associated with left lower quadrant abdominal pain, intractable nausea, and vomiting.   PMH: Parkinson's disease, essential hypertension, history of breast cancer and colon cancer   OT comments  Pt making steady progress towards OT goals this session. Pt greeted supine in bed, pt agreeable to OT intervention. Pt currently requires CGA for ambulatory ADL transfers with RW, set- up for UB ADLS and MAX A for LB ADLS d/t frequent loose stools. Pt was able to stand at sink for grooming tasks with CGA for balance. Per chart review, pt now to DC to SNF where her husband as at for post acute rehab. Patient will benefit from continued inpatient follow up therapy, <3 hours/day to facilitate improved strength and endurance to complete ADLs independently with LRAD and reduce risk of falls in the home. Will alert OT/L about change in POC.         If plan is discharge home, recommend the following:  A little help with bathing/dressing/bathroom;Assistance with cooking/housework;Assist for transportation;Help with stairs or ramp for entrance   Equipment Recommendations  None recommended by OT    Recommendations for Other Services      Precautions / Restrictions Precautions Precautions: Fall Precaution/Restrictions Comments: pt with parkinsonian tremors Restrictions Weight Bearing Restrictions Per Provider Order: No       Mobility Bed Mobility Overal bed mobility: Needs Assistance Bed Mobility: Rolling, Sidelying to Sit Rolling: Supervision Sidelying to sit: Min assist       General bed mobility comments: light MIN A to elevate trunk into sitting from sidelying    Transfers Overall transfer level: Needs assistance Equipment used: Rolling walker (2  wheels) Transfers: Sit to/from Stand Sit to Stand: Min assist, Contact guard assist           General transfer comment: initial MIN A to rise from EOB but progressed to CGA as session continued     Balance Overall balance assessment: Needs assistance Sitting-balance support: Feet supported, No upper extremity supported Sitting balance-Leahy Scale: Fair     Standing balance support: During functional activity, Single extremity supported Standing balance-Leahy Scale: Poor Standing balance comment: CGA for balance while completing pericare                           ADL either performed or assessed with clinical judgement   ADL Overall ADL's : Needs assistance/impaired     Grooming: Wash/dry hands;Wash/dry face;Standing;Contact guard assist Grooming Details (indicate cue type and reason): CGA for balance at sink with ADLs     Lower Body Bathing: Sit to/from stand;Maximal assistance Lower Body Bathing Details (indicate cue type and reason): MAX A d/t repetitive bouts of bowel movements Upper Body Dressing : Set up;Sitting Upper Body Dressing Details (indicate cue type and reason): to don new gown Lower Body Dressing: Total assistance;Bed level Lower Body Dressing Details (indicate cue type and reason): total A to don socks from bed level d/t time constraints Toilet Transfer: Rolling walker (2 wheels);Ambulation;Regular Toilet;Grab bars;Minimal assistance;Contact guard assist Toilet Transfer Details (indicate cue type and reason): MIN A to lower on toilet; CGA for functional ambulation with RW for balance and safety Toileting- Clothing Manipulation and Hygiene: Contact guard assist;Sit to/from stand Toileting - Clothing Manipulation Details (indicate cue type and reason):  CGA for pericare after continent urine void on toilet     Functional mobility during ADLs: Contact guard assist;Rolling walker (2 wheels) General ADL Comments: ADL participation limited by frequent  bouts of loose stool    Extremity/Trunk Assessment Upper Extremity Assessment Upper Extremity Assessment: Generalized weakness   Lower Extremity Assessment Lower Extremity Assessment: Defer to PT evaluation        Vision Baseline Vision/History: 1 Wears glasses Ability to See in Adequate Light: 0 Adequate Patient Visual Report: No change from baseline Vision Assessment?: No apparent visual deficits   Perception Perception Perception: Within Functional Limits   Praxis Praxis Praxis: WFL   Communication Communication Communication: No apparent difficulties   Cognition Arousal: Alert Behavior During Therapy: WFL for tasks assessed/performed Cognition: No apparent impairments                               Following commands: Intact        Cueing   Cueing Techniques: Verbal cues  Exercises      Shoulder Instructions       General Comments limited by frequent stools    Pertinent Vitals/ Pain       Pain Assessment Pain Assessment: No/denies pain  Home Living                                          Prior Functioning/Environment              Frequency  Min 1X/week        Progress Toward Goals  OT Goals(current goals can now be found in the care plan section)  Progress towards OT goals: Progressing toward goals  Acute Rehab OT Goals OT Goal Formulation: With patient Time For Goal Achievement: 05/07/23 Potential to Achieve Goals: Good  Plan      Co-evaluation                 AM-PAC OT "6 Clicks" Daily Activity     Outcome Measure   Help from another person eating meals?: A Little Help from another person taking care of personal grooming?: A Little Help from another person toileting, which includes using toliet, bedpan, or urinal?: A Lot Help from another person bathing (including washing, rinsing, drying)?: A Little Help from another person to put on and taking off regular upper body clothing?: A  Little Help from another person to put on and taking off regular lower body clothing?: A Lot 6 Click Score: 16    End of Session Equipment Utilized During Treatment: Rolling walker (2 wheels);Gait belt;Other (comment) (BSC)  OT Visit Diagnosis: Unsteadiness on feet (R26.81);Muscle weakness (generalized) (M62.81)   Activity Tolerance Patient tolerated treatment well   Patient Left Other (comment) (on BSC with call bell in lap; nurse tech aware)   Nurse Communication Mobility status;Other (comment) (NT present at end of session)        Time: 0981-1914 OT Time Calculation (min): 30 min  Charges: OT General Charges $OT Visit: 1 Visit OT Treatments $Self Care/Home Management : 23-37 mins  Lenor Derrick., COTA/L Acute Rehabilitation Services [phone]   Barron Schmid 04/25/2023, 2:29 PM

## 2023-04-26 DIAGNOSIS — J9691 Respiratory failure, unspecified with hypoxia: Secondary | ICD-10-CM | POA: Diagnosis not present

## 2023-04-26 DIAGNOSIS — R2689 Other abnormalities of gait and mobility: Secondary | ICD-10-CM | POA: Diagnosis not present

## 2023-04-26 DIAGNOSIS — E876 Hypokalemia: Secondary | ICD-10-CM | POA: Diagnosis not present

## 2023-04-26 DIAGNOSIS — Z85038 Personal history of other malignant neoplasm of large intestine: Secondary | ICD-10-CM | POA: Diagnosis not present

## 2023-04-26 DIAGNOSIS — R531 Weakness: Secondary | ICD-10-CM | POA: Diagnosis not present

## 2023-04-26 DIAGNOSIS — Z853 Personal history of malignant neoplasm of breast: Secondary | ICD-10-CM | POA: Diagnosis not present

## 2023-04-26 DIAGNOSIS — R278 Other lack of coordination: Secondary | ICD-10-CM | POA: Diagnosis not present

## 2023-04-26 DIAGNOSIS — Z7401 Bed confinement status: Secondary | ICD-10-CM | POA: Diagnosis not present

## 2023-04-26 DIAGNOSIS — G20C Parkinsonism, unspecified: Secondary | ICD-10-CM | POA: Diagnosis not present

## 2023-04-26 DIAGNOSIS — R8271 Bacteriuria: Secondary | ICD-10-CM | POA: Diagnosis not present

## 2023-04-26 DIAGNOSIS — I10 Essential (primary) hypertension: Secondary | ICD-10-CM | POA: Diagnosis not present

## 2023-04-26 DIAGNOSIS — K5900 Constipation, unspecified: Secondary | ICD-10-CM | POA: Diagnosis not present

## 2023-04-26 DIAGNOSIS — R52 Pain, unspecified: Secondary | ICD-10-CM | POA: Diagnosis not present

## 2023-04-26 DIAGNOSIS — Z79899 Other long term (current) drug therapy: Secondary | ICD-10-CM | POA: Diagnosis not present

## 2023-04-26 DIAGNOSIS — Z5189 Encounter for other specified aftercare: Secondary | ICD-10-CM | POA: Diagnosis not present

## 2023-04-26 DIAGNOSIS — A084 Viral intestinal infection, unspecified: Secondary | ICD-10-CM | POA: Diagnosis not present

## 2023-04-26 DIAGNOSIS — G20A1 Parkinson's disease without dyskinesia, without mention of fluctuations: Secondary | ICD-10-CM | POA: Diagnosis not present

## 2023-04-26 DIAGNOSIS — R110 Nausea: Secondary | ICD-10-CM | POA: Diagnosis not present

## 2023-04-26 DIAGNOSIS — K5792 Diverticulitis of intestine, part unspecified, without perforation or abscess without bleeding: Secondary | ICD-10-CM | POA: Diagnosis not present

## 2023-04-26 DIAGNOSIS — M1711 Unilateral primary osteoarthritis, right knee: Secondary | ICD-10-CM | POA: Diagnosis not present

## 2023-04-26 DIAGNOSIS — D0511 Intraductal carcinoma in situ of right breast: Secondary | ICD-10-CM | POA: Diagnosis not present

## 2023-04-26 DIAGNOSIS — R1111 Vomiting without nausea: Secondary | ICD-10-CM | POA: Diagnosis not present

## 2023-04-26 DIAGNOSIS — R197 Diarrhea, unspecified: Secondary | ICD-10-CM | POA: Diagnosis not present

## 2023-04-26 DIAGNOSIS — K529 Noninfective gastroenteritis and colitis, unspecified: Secondary | ICD-10-CM | POA: Diagnosis not present

## 2023-04-26 DIAGNOSIS — M6281 Muscle weakness (generalized): Secondary | ICD-10-CM | POA: Diagnosis not present

## 2023-04-26 DIAGNOSIS — R10814 Left lower quadrant abdominal tenderness: Secondary | ICD-10-CM | POA: Diagnosis not present

## 2023-04-26 DIAGNOSIS — C189 Malignant neoplasm of colon, unspecified: Secondary | ICD-10-CM | POA: Diagnosis not present

## 2023-04-26 DIAGNOSIS — Z96651 Presence of right artificial knee joint: Secondary | ICD-10-CM | POA: Diagnosis not present

## 2023-04-26 DIAGNOSIS — Z743 Need for continuous supervision: Secondary | ICD-10-CM | POA: Diagnosis not present

## 2023-04-26 DIAGNOSIS — R3915 Urgency of urination: Secondary | ICD-10-CM | POA: Diagnosis not present

## 2023-04-26 DIAGNOSIS — R112 Nausea with vomiting, unspecified: Secondary | ICD-10-CM | POA: Diagnosis not present

## 2023-04-26 LAB — GLUCOSE, CAPILLARY: Glucose-Capillary: 96 mg/dL (ref 70–99)

## 2023-04-26 NOTE — Discharge Summary (Signed)
 Physician Discharge Summary   Patient: Katie Carrillo MRN: [id number] DOB: [birth year]  Admit date:     04/22/2023  Discharge date: 04/26/23  Discharge Physician: Baron Hamper    PCP: Daisy Floro, MD     Hospital Course: 83 y.o. female with medical history significant for Parkinson's disease, essential hypertension, history of breast cancer and colon cancer presents with complaints of 2 days of profuse diarrhea, associated with left lower quadrant abdominal pain, intractable nausea, and vomiting.  Denies recent use of antibiotics.  Denies known sick contacts.  No reported subjective fevers or chills.  No cardiopulmonary symptoms.  Has not been able to tolerate oral intake.  Also admits to generalized weakness to the point where she is unable to ambulate without assistance.  She was brought into the ER for further evaluation.  Admitted to the hospitalist service due to ongoing weakness and IV hydration. She was able to be switched to PO intake and was able to tolerate a regular diet. Pathogen panel was negative and she has not had further diarrhea since admission.  No further episodes of nausea or vomiting since admission.  Weakness has improved and she is working with both physical and Occupational Therapy.  Pt requested to go to the same facility that her husband was at. On 04/26/2023 the pt was accepted at [facility] and she will be discharged there today.  DISCHARGE MEDICATION: Allergies as of 04/26/2023       Reactions   Morphine And Codeine Other (See Comments)   Hallucinations; GI issues        Medication List     STOP taking these medications    lisinopril-hydrochlorothiazide 10-12.5 MG tablet Commonly known as: ZESTORETIC   propranolol 10 MG tablet Commonly known as: INDERAL   Red Yeast Rice 600 MG Caps       TAKE these medications    acetaminophen 325 MG tablet Commonly known as: TYLENOL Take 325 mg by mouth daily.   carbidopa-levodopa 25-100 MG  tablet Commonly known as: SINEMET IR Take 1 tablet by mouth 3 (three) times daily.   donepezil 23 MG Tabs tablet Commonly known as: ARICEPT Take 23 mg by mouth at bedtime.   Fish Oil 1200 MG Caps Take 1,200 mg by mouth 2 (two) times daily.   memantine 10 MG tablet Commonly known as: NAMENDA Take 10 mg by mouth 2 (two) times daily.   multivitamin with minerals Tabs tablet Take 1 tablet by mouth daily. One-A-Day Women's   polyethylene glycol 17 g packet Commonly known as: MIRALAX / GLYCOLAX Take 17 g by mouth daily.   triamcinolone ointment 0.5 % Commonly known as: KENALOG Pea-sized amount to affected area once daily as needed        Discharge Exam: Filed Weights   04/22/23 0902  Weight: 86.2 kg   Physical Exam HENT:     Head: Normocephalic.     Mouth/Throat:     Mouth: Mucous membranes are moist.  Cardiovascular:     Rate and Rhythm: Normal rate.  Pulmonary:     Effort: Pulmonary effort is normal.  Abdominal:     Palpations: Abdomen is soft.  Musculoskeletal:     Cervical back: Neck supple.  Skin:    General: Skin is warm.  Neurological:     Mental Status: She is alert. Mental status is at baseline.  Psychiatric:        Mood and Affect: Mood normal.      Condition at discharge: fair  The  results of significant diagnostics from this hospitalization (including imaging, microbiology, ancillary and laboratory) are listed below for reference.   Imaging Studies: CT ABDOMEN PELVIS W CONTRAST Result Date: 04/22/2023 CLINICAL DATA:  Left lower quadrant pain with nausea, diarrhea, emesis and weakness EXAM: CT ABDOMEN AND PELVIS WITH CONTRAST TECHNIQUE: Multidetector CT imaging of the abdomen and pelvis was performed using the standard protocol following bolus administration of intravenous contrast. RADIATION DOSE REDUCTION: This exam was performed according to the departmental dose-optimization program which includes automated exposure control, adjustment of the  mA and/or kV according to patient size and/or use of iterative reconstruction technique. CONTRAST:  75mL OMNIPAQUE IOHEXOL 350 MG/ML SOLN COMPARISON:  CT abdomen and pelvis dated 04/05/2022, CTA chest dated 05/22/2021 FINDINGS: Lower chest: Subsolid right middle lobe nodule measuring 4 mm (5:1) is unchanged since 05/22/2021. No specific follow-up imaging recommended. No pleural effusion or pneumothorax demonstrated. Partially imaged heart size is normal. Hepatobiliary: Scattered subcentimeter hypodensities, too small to characterize. No intra or extrahepatic biliary ductal dilation. Normal gallbladder. Pancreas: No focal lesions or main ductal dilation. Spleen: Normal in size without focal abnormality. Adrenals/Urinary Tract: 6 mm myelolipoma in the right adrenal gland. No specific follow-up imaging recommended. No left adrenal nodule. No suspicious renal mass or hydronephrosis. Punctate nonobstructing right renal stone. Unchanged bilateral cysts. No specific follow-up imaging recommended. No focal bladder wall thickening. Stomach/Bowel: Small hiatal hernia. Normal appearance of the stomach. Postsurgical changes of the right upper quadrant with patent anastomosis. Appendectomy. Vascular/Lymphatic: Aortic atherosclerosis. No enlarged abdominal or pelvic lymph nodes. Reproductive: No adnexal masses. Other: No free fluid, fluid collection, or free air. Musculoskeletal: No acute or abnormal lytic or blastic osseous lesions. Multilevel degenerative changes of the partially imaged thoracic and lumbar spine. IMPRESSION: 1. No acute abdominopelvic findings. 2. Punctate nonobstructing right renal stone. 3. Small hiatal hernia. 4.  Aortic Atherosclerosis (ICD10-I70.0). Electronically Signed   By: Agustin Cree M.D.   On: 04/22/2023 15:43    Microbiology: Results for orders placed or performed during the hospital encounter of 04/22/23  Resp panel by RT-PCR (RSV, Flu A&B, Covid) Anterior Nasal Swab     Status: None    Collection Time: 04/22/23  9:23 AM   Specimen: Anterior Nasal Swab  Result Value Ref Range Status   SARS Coronavirus 2 by RT PCR NEGATIVE NEGATIVE Final   Influenza A by PCR NEGATIVE NEGATIVE Final   Influenza B by PCR NEGATIVE NEGATIVE Final    Comment: (NOTE) The Xpert Xpress SARS-CoV-2/FLU/RSV plus assay is intended as an aid in the diagnosis of influenza from Nasopharyngeal swab specimens and should not be used as a sole basis for treatment. Nasal washings and aspirates are unacceptable for Xpert Xpress SARS-CoV-2/FLU/RSV testing.  Fact Sheet for Patients: BloggerCourse.com  Fact Sheet for Healthcare Providers: SeriousBroker.it  This test is not yet approved or cleared by the [place] FDA and has been authorized for detection and/or diagnosis of SARS-CoV-2 by FDA under an Emergency Use Authorization (EUA). This EUA will remain in effect (meaning this test can be used) for the duration of the COVID-19 declaration under Section 564(b)(1) of the Act, 21 U.S.C. section 360bbb-3(b)(1), unless the authorization is terminated or revoked.     Resp Syncytial Virus by PCR NEGATIVE NEGATIVE Final    Comment: (NOTE) Fact Sheet for Patients: BloggerCourse.com  Fact Sheet for Healthcare Providers: SeriousBroker.it  This test is not yet approved or cleared by the [place] FDA and has been authorized for detection and/or diagnosis of SARS-CoV-2 by FDA  under an Emergency Use Authorization (EUA). This EUA will remain in effect (meaning this test can be used) for the duration of the COVID-19 declaration under Section 564(b)(1) of the Act, 21 U.S.C. section 360bbb-3(b)(1), unless the authorization is terminated or revoked.  Performed at [facility], [address] [place], [place] [address]     Labs: CBC: Recent Labs  Lab 04/22/23 0914 04/23/23 0644 04/24/23 0717   WBC 6.9 4.6 5.9  HGB 12.5 11.0* 12.0  HCT 39.0 35.2* 37.3  MCV 94.0 95.4 93.0  PLT 195 143* 154   Basic Metabolic Panel: Recent Labs  Lab 04/22/23 0914 04/23/23 0644 04/24/23 0717 04/25/23 0839  NA 139 141 142 139  K 3.3* 3.3* 3.4* 4.0  CL 105 109 106 104  CO2 24 24 27 23   GLUCOSE 118* 84 105* 94  BUN 22 14 6* 14  CREATININE 0.87 0.64 0.66 0.61  CALCIUM 8.7* 8.4* 8.4* 8.7*  MG 2.0 2.0  --   --   PHOS  --  3.4  --   --    Liver Function Tests: Recent Labs  Lab 04/22/23 0914  AST 21  ALT 12  ALKPHOS 41  BILITOT 0.5  PROT 6.3*  ALBUMIN 3.6   CBG: Recent Labs  Lab 04/22/23 1225  GLUCAP 86    Discharge time spent: greater than 30 minutes.  Signed: Baron Hamper , MD [facility] 04/26/2023

## 2023-04-26 NOTE — TOC Progression Note (Addendum)
 Transition of Care [facility]) - Progression Note    Patient Details  Name: Katie Carrillo MRN: [id number] Date of Birth: 1940-06-13  Transition of Care [facility]) CM/SW Contact  Lillyan Hitson A [place], LCSW Phone Number: 04/26/2023, 11:25 AM  Clinical Narrative:     CSW contacted admissions, spoke with Annette Stable, they stated they have beds available today. Pt's authorization was approved.   Berkley Harvey ID [id number]  Reference ID [id number]  Auth approval dates:  04/26/2023-04/28/2023  CSW notified provider.   Possible DC today if pt is stable for DC.    TOC will continue to follow.   Expected Discharge Plan: Skilled Nursing Facility Barriers to Discharge: Continued Medical Work up  Expected Discharge Plan and Services       Living arrangements for the past 2 months: Single Family Home                                       Social Determinants of Health (SDOH) Interventions SDOH Screenings   Food Insecurity: No Food Insecurity (04/22/2023)  Housing: Low Risk  (04/22/2023)  Transportation Needs: No Transportation Needs (04/22/2023)  Utilities: Not At Risk (04/22/2023)  Alcohol Screen: Low Risk  (06/16/2021)  Depression (PHQ2-9): Low Risk  (06/16/2021)  Financial Resource Strain: High Risk (06/16/2021)  Physical Activity: Insufficiently Active (06/16/2021)  Social Connections: Socially Integrated (04/22/2023)  Stress: No Stress Concern Present (06/16/2021)  Tobacco Use: Low Risk  (04/23/2023)    Readmission Risk Interventions     No data to display

## 2023-04-26 NOTE — Plan of Care (Signed)

## 2023-04-26 NOTE — TOC Transition Note (Signed)
 Transition of Care [facility]) - Discharge Note   Patient Details  Name: Katie Carrillo MRN: [id number] Date of Birth: November 03, 1940  Transition of Care [facility]) CM/SW Contact:  Bryanne Riquelme A [place], LCSW Phone Number: 04/26/2023, 12:42 PM   Clinical Narrative:     Patient will DC to: [facility]  Anticipated DC date: 04/26/23  Family notified: Katie Carrillo, daughter  Transport by: Sharin Mons, CSW contacted to schedule at 1210 approximately, nursing notified.       Per MD patient ready for DC to [facility] rehab. RN, patient, patient's family, and facility notified of DC. Discharge Summary and FL2 sent to facility. RN to call report prior to discharge [phone]). DC packet on chart. Ambulance transport requested for patient.     CSW will sign off for now as social work intervention is no longer needed. Please consult [place] again if new needs arise.   Final next level of care: Skilled Nursing Facility Barriers to Discharge: Barriers Resolved   Patient Goals and CMS Choice            Discharge Placement              Patient [facility] bed at: [facility] Patient to be transferred to facility by: [facility] Name of family member notified: Katie Carrillo, pt's daughter Patient and family notified of of transfer: 04/26/23  Discharge Plan and Services Additional resources added to the After Visit Summary for                                       Social Drivers of Health (SDOH) Interventions SDOH Screenings   Food Insecurity: No Food Insecurity (04/22/2023)  Housing: Low Risk  (04/22/2023)  Transportation Needs: No Transportation Needs (04/22/2023)  Utilities: Not At Risk (04/22/2023)  Alcohol Screen: Low Risk  (06/16/2021)  Depression (PHQ2-9): Low Risk  (06/16/2021)  Financial Resource Strain: High Risk (06/16/2021)  Physical Activity: Insufficiently Active (06/16/2021)  Social Connections: Socially Integrated (04/22/2023)  Stress:  No Stress Concern Present (06/16/2021)  Tobacco Use: Low Risk  (04/23/2023)     Readmission Risk Interventions     No data to display

## 2023-04-27 DIAGNOSIS — R197 Diarrhea, unspecified: Secondary | ICD-10-CM | POA: Diagnosis not present

## 2023-04-27 DIAGNOSIS — R52 Pain, unspecified: Secondary | ICD-10-CM | POA: Diagnosis not present

## 2023-04-27 DIAGNOSIS — G20A1 Parkinson's disease without dyskinesia, without mention of fluctuations: Secondary | ICD-10-CM | POA: Diagnosis not present

## 2023-04-27 DIAGNOSIS — R531 Weakness: Secondary | ICD-10-CM | POA: Diagnosis not present

## 2023-04-27 DIAGNOSIS — Z5189 Encounter for other specified aftercare: Secondary | ICD-10-CM | POA: Diagnosis not present

## 2023-04-27 DIAGNOSIS — R112 Nausea with vomiting, unspecified: Secondary | ICD-10-CM | POA: Diagnosis not present

## 2023-04-27 DIAGNOSIS — K5900 Constipation, unspecified: Secondary | ICD-10-CM | POA: Diagnosis not present

## 2023-04-29 DIAGNOSIS — G20A1 Parkinson's disease without dyskinesia, without mention of fluctuations: Secondary | ICD-10-CM | POA: Diagnosis not present

## 2023-04-29 DIAGNOSIS — Z5189 Encounter for other specified aftercare: Secondary | ICD-10-CM | POA: Diagnosis not present

## 2023-04-29 DIAGNOSIS — R531 Weakness: Secondary | ICD-10-CM | POA: Diagnosis not present

## 2023-05-02 DIAGNOSIS — G20A1 Parkinson's disease without dyskinesia, without mention of fluctuations: Secondary | ICD-10-CM | POA: Diagnosis not present

## 2023-05-02 DIAGNOSIS — M6281 Muscle weakness (generalized): Secondary | ICD-10-CM | POA: Diagnosis not present

## 2023-05-02 DIAGNOSIS — A084 Viral intestinal infection, unspecified: Secondary | ICD-10-CM | POA: Diagnosis not present

## 2023-05-03 DIAGNOSIS — G20A1 Parkinson's disease without dyskinesia, without mention of fluctuations: Secondary | ICD-10-CM | POA: Diagnosis not present

## 2023-05-03 DIAGNOSIS — R531 Weakness: Secondary | ICD-10-CM | POA: Diagnosis not present

## 2023-05-04 DIAGNOSIS — R531 Weakness: Secondary | ICD-10-CM | POA: Diagnosis not present

## 2023-05-04 DIAGNOSIS — G20A1 Parkinson's disease without dyskinesia, without mention of fluctuations: Secondary | ICD-10-CM | POA: Diagnosis not present

## 2023-05-06 DIAGNOSIS — G20A1 Parkinson's disease without dyskinesia, without mention of fluctuations: Secondary | ICD-10-CM | POA: Diagnosis not present

## 2023-05-06 DIAGNOSIS — R531 Weakness: Secondary | ICD-10-CM | POA: Diagnosis not present

## 2023-05-09 DIAGNOSIS — R531 Weakness: Secondary | ICD-10-CM | POA: Diagnosis not present

## 2023-05-09 DIAGNOSIS — G20A1 Parkinson's disease without dyskinesia, without mention of fluctuations: Secondary | ICD-10-CM | POA: Diagnosis not present

## 2023-05-30 ENCOUNTER — Ambulatory Visit

## 2023-06-15 ENCOUNTER — Ambulatory Visit

## 2023-09-12 DIAGNOSIS — G20A1 Parkinson's disease without dyskinesia, without mention of fluctuations: Secondary | ICD-10-CM | POA: Diagnosis not present

## 2023-09-12 DIAGNOSIS — R634 Abnormal weight loss: Secondary | ICD-10-CM | POA: Diagnosis not present

## 2023-09-12 DIAGNOSIS — G4700 Insomnia, unspecified: Secondary | ICD-10-CM | POA: Diagnosis not present

## 2023-11-03 DIAGNOSIS — R531 Weakness: Secondary | ICD-10-CM | POA: Diagnosis not present

## 2023-11-03 DIAGNOSIS — I959 Hypotension, unspecified: Secondary | ICD-10-CM | POA: Diagnosis not present

## 2023-11-03 DIAGNOSIS — R251 Tremor, unspecified: Secondary | ICD-10-CM | POA: Diagnosis not present

## 2023-11-03 DIAGNOSIS — Z23 Encounter for immunization: Secondary | ICD-10-CM | POA: Diagnosis not present

## 2023-11-03 DIAGNOSIS — E46 Unspecified protein-calorie malnutrition: Secondary | ICD-10-CM | POA: Diagnosis not present

## 2023-11-03 DIAGNOSIS — Z6822 Body mass index (BMI) 22.0-22.9, adult: Secondary | ICD-10-CM | POA: Diagnosis not present

## 2023-11-15 DIAGNOSIS — L578 Other skin changes due to chronic exposure to nonionizing radiation: Secondary | ICD-10-CM | POA: Diagnosis not present

## 2023-11-15 DIAGNOSIS — L821 Other seborrheic keratosis: Secondary | ICD-10-CM | POA: Diagnosis not present

## 2023-11-15 DIAGNOSIS — C44629 Squamous cell carcinoma of skin of left upper limb, including shoulder: Secondary | ICD-10-CM | POA: Diagnosis not present

## 2023-11-15 DIAGNOSIS — D044 Carcinoma in situ of skin of scalp and neck: Secondary | ICD-10-CM | POA: Diagnosis not present

## 2023-11-15 DIAGNOSIS — D485 Neoplasm of uncertain behavior of skin: Secondary | ICD-10-CM | POA: Diagnosis not present

## 2023-11-15 DIAGNOSIS — D0439 Carcinoma in situ of skin of other parts of face: Secondary | ICD-10-CM | POA: Diagnosis not present

## 2023-11-15 DIAGNOSIS — L570 Actinic keratosis: Secondary | ICD-10-CM | POA: Diagnosis not present

## 2024-02-13 ENCOUNTER — Telehealth: Payer: Self-pay

## 2024-02-13 DIAGNOSIS — Z0000 Encounter for general adult medical examination without abnormal findings: Secondary | ICD-10-CM

## 2024-02-15 ENCOUNTER — Telehealth: Payer: Self-pay | Admitting: *Deleted

## 2024-02-15 NOTE — Progress Notes (Signed)
 Complex Care Management Note Care Guide Note  02/15/2024 Name: MATTALYN ANDEREGG MRN: [id number] DOB: 1940-12-09   Complex Care Management Outreach Attempts: An unsuccessful telephone outreach was attempted today to offer the patient information about available complex care management services.  Follow Up Plan:  Additional outreach attempts will be made to offer the patient complex care management information and services.   Encounter Outcome:  No Answer  Harlene Satterfield  [facility]  [facility] Guide  Direct Dial: [phone]  Fax [phone]\

## 2024-02-17 NOTE — Progress Notes (Signed)
 Complex Care Management Note  Care Guide Note 02/17/2024 Name: Katie Carrillo MRN: [id number] DOB: [birth year]  Katie Carrillo is a 84 y.o. year old female who sees Okey Carlin Redbird, MD for primary care. I reached out to Dickey KATHEE Blush by phone today to offer complex care management services.  Ms. Medinger was given information about Complex Care Management services today including:   The Complex Care Management services include support from the care team which includes your Nurse Care Manager, Clinical Social Worker, or Pharmacist.  The Complex Care Management team is here to help remove barriers to the health concerns and goals most important to you. Complex Care Management services are voluntary, and the patient may decline or stop services at any time by request to their care team member.   Complex Care Management Consent Status: Patient agreed to services and verbal consent obtained.   Follow up plan:  Telephone appointment with complex care management team member scheduled for:  02/24/24  Encounter Outcome:  Patient Scheduled  Harlene Satterfield  [facility]  [facility] Guide  Direct Dial: [phone]  Fax [phone]

## 2024-02-24 ENCOUNTER — Other Ambulatory Visit: Payer: Self-pay | Admitting: Licensed Clinical Social Worker

## 2024-02-24 NOTE — Patient Outreach (Addendum)
 LCSW spoke with patient on the phone. LCSW introduced self and explained reason for the call. Patient stated that she is lonely after the passing of her husband of 65 years, in December. LCSW and patient discussed senior resources of [place]. LCSW gave patient information about a support group at [facility] on the 2nd Tuesday of the moth. Patient reports she will look into it. Patient requested LCSW call back in 1-2 weeks to complete assessment.   Katie Ligas, LCSW Clinical Social Worker [facility]

## 2024-02-24 NOTE — Patient Instructions (Signed)
 Visit Information  Thank you for taking time to visit with me today. Please don't hesitate to contact me if I can be of assistance to you before our next scheduled appointment.  Our next appointment is by telephone on 03/14/24 at 3pm. Please call the care guide team at [phone] if you need to cancel or reschedule your appointment.   Following is a copy of your care plan:   Goals Addressed   None     Please call 911 if you are experiencing a Mental Health or Behavioral Health Crisis or need someone to talk to.  Patient verbalized understanding of Care plan and visit instructions communicated this visit  Cena Ligas, LCSW Clinical Social Worker [facility] [employer]

## 2024-03-14 ENCOUNTER — Telehealth: Admitting: Licensed Clinical Social Worker
